# Patient Record
Sex: Male | Born: 1975
Health system: Southern US, Community
[De-identification: ages and names within clinical notes are randomized; demographics above are authoritative.]

## PROBLEM LIST (undated history)

## (undated) DIAGNOSIS — I480 Paroxysmal atrial fibrillation: Secondary | ICD-10-CM

## (undated) DIAGNOSIS — I4891 Unspecified atrial fibrillation: Secondary | ICD-10-CM

## (undated) DIAGNOSIS — J45909 Unspecified asthma, uncomplicated: Secondary | ICD-10-CM

## (undated) DIAGNOSIS — I10 Essential (primary) hypertension: Secondary | ICD-10-CM

## (undated) DIAGNOSIS — I499 Cardiac arrhythmia, unspecified: Secondary | ICD-10-CM

## (undated) DIAGNOSIS — I441 Atrioventricular block, second degree: Secondary | ICD-10-CM

## (undated) DIAGNOSIS — R7303 Prediabetes: Secondary | ICD-10-CM

## (undated) DIAGNOSIS — K219 Gastro-esophageal reflux disease without esophagitis: Secondary | ICD-10-CM

## (undated) HISTORY — PX: BRAIN SURGERY: SHX531

---

## 2012-06-17 ENCOUNTER — Emergency Department: Payer: Self-pay | Admitting: Emergency Medicine

## 2012-08-04 ENCOUNTER — Emergency Department: Payer: Self-pay | Admitting: Internal Medicine

## 2012-08-04 LAB — BASIC METABOLIC PANEL
Anion Gap: 5 — ABNORMAL LOW (ref 7–16)
BUN: 9 mg/dL (ref 7–18)
Creatinine: 1.04 mg/dL (ref 0.60–1.30)
EGFR (African American): >60
Glucose: 109 mg/dL — ABNORMAL HIGH (ref 65–99)
Osmolality: 277 (ref 275–301)
Potassium: 3.8 mmol/L (ref 3.5–5.1)

## 2012-08-04 LAB — MAGNESIUM: Magnesium: 1.8 mg/dL

## 2012-10-17 ENCOUNTER — Emergency Department: Payer: Self-pay | Admitting: Emergency Medicine

## 2013-05-10 ENCOUNTER — Emergency Department: Payer: Self-pay | Admitting: Internal Medicine

## 2013-07-22 ENCOUNTER — Emergency Department: Payer: Self-pay | Admitting: Emergency Medicine

## 2013-10-01 ENCOUNTER — Emergency Department: Payer: Self-pay

## 2013-11-30 ENCOUNTER — Emergency Department: Payer: Self-pay | Admitting: Emergency Medicine

## 2013-12-01 ENCOUNTER — Emergency Department: Payer: Self-pay | Admitting: Emergency Medicine

## 2014-01-12 ENCOUNTER — Emergency Department: Payer: Self-pay | Admitting: Emergency Medicine

## 2014-02-18 ENCOUNTER — Emergency Department: Payer: Self-pay | Admitting: Emergency Medicine

## 2014-03-06 ENCOUNTER — Emergency Department: Payer: Self-pay | Admitting: Emergency Medicine

## 2014-04-02 ENCOUNTER — Emergency Department: Payer: Self-pay | Admitting: Student

## 2014-04-10 ENCOUNTER — Emergency Department: Payer: Self-pay | Admitting: Emergency Medicine

## 2014-05-18 ENCOUNTER — Emergency Department: Payer: Self-pay | Admitting: Emergency Medicine

## 2014-07-14 ENCOUNTER — Emergency Department: Payer: Self-pay | Admitting: Student

## 2014-09-23 ENCOUNTER — Emergency Department: Payer: Self-pay | Admitting: Emergency Medicine

## 2014-09-29 ENCOUNTER — Emergency Department: Payer: Self-pay | Admitting: Emergency Medicine

## 2015-09-11 DIAGNOSIS — J45909 Unspecified asthma, uncomplicated: Secondary | ICD-10-CM | POA: Diagnosis not present

## 2015-09-11 DIAGNOSIS — J4530 Mild persistent asthma, uncomplicated: Secondary | ICD-10-CM | POA: Diagnosis not present

## 2015-09-11 DIAGNOSIS — K219 Gastro-esophageal reflux disease without esophagitis: Secondary | ICD-10-CM | POA: Diagnosis not present

## 2015-09-11 DIAGNOSIS — Z23 Encounter for immunization: Secondary | ICD-10-CM | POA: Diagnosis not present

## 2015-09-19 DIAGNOSIS — J4530 Mild persistent asthma, uncomplicated: Secondary | ICD-10-CM | POA: Diagnosis not present

## 2015-10-16 DIAGNOSIS — R0602 Shortness of breath: Secondary | ICD-10-CM | POA: Diagnosis not present

## 2016-01-01 ENCOUNTER — Emergency Department
Admission: EM | Admit: 2016-01-01 | Discharge: 2016-01-01 | Disposition: A | Discharge status: Home / Self Care | Payer: Self-pay | Attending: Emergency Medicine | Admitting: Emergency Medicine

## 2016-01-01 DIAGNOSIS — F17200 Nicotine dependence, unspecified, uncomplicated: Secondary | ICD-10-CM | POA: Insufficient documentation

## 2016-01-01 DIAGNOSIS — W450XXA Nail entering through skin, initial encounter: Secondary | ICD-10-CM | POA: Insufficient documentation

## 2016-01-01 DIAGNOSIS — Y939 Activity, unspecified: Secondary | ICD-10-CM | POA: Insufficient documentation

## 2016-01-01 DIAGNOSIS — S91331A Puncture wound without foreign body, right foot, initial encounter: Secondary | ICD-10-CM | POA: Insufficient documentation

## 2016-01-01 DIAGNOSIS — Y999 Unspecified external cause status: Secondary | ICD-10-CM | POA: Insufficient documentation

## 2016-01-01 DIAGNOSIS — Y929 Unspecified place or not applicable: Secondary | ICD-10-CM | POA: Insufficient documentation

## 2016-01-01 DIAGNOSIS — Z5321 Procedure and treatment not carried out due to patient leaving prior to being seen by health care provider: Secondary | ICD-10-CM | POA: Insufficient documentation

## 2016-01-01 NOTE — ED Notes (Addendum)
Pt in with co scrapping the bottom of his right foot along a nail, 4 abrasions noted to area.

## 2016-01-01 NOTE — ED Notes (Signed)
Patient who is an employee here states that he can't wait any longer as he has to be at work at [employer] and will seek a tetanus shot at employee health.

## 2016-01-02 ENCOUNTER — Encounter: Payer: Self-pay | Admitting: Physician Assistant

## 2016-01-02 ENCOUNTER — Ambulatory Visit: Payer: Self-pay | Admitting: Physician Assistant

## 2016-01-02 VITALS — BP 150/80 | HR 80 | Temp 98.6°F

## 2016-01-02 DIAGNOSIS — S90811A Abrasion, right foot, initial encounter: Secondary | ICD-10-CM

## 2016-01-02 NOTE — Progress Notes (Signed)
S: scraped foot on a nail, area was bleeding a lot, went to ER but left before being seen, tdap is utd, less than 5 years per patient, no fever/chills  O: vitals wnl, nad, skin with several abrasion on plantar surface of r foot, no redness, pus, or drainage noted, some dried blood on the other toes, n/v intact  A: abrasion to foot  P: reassurance, applied 2 bandaids, use soap and water to clean area, if redness/pus/swelling return for eval

## 2016-07-30 DIAGNOSIS — J45998 Other asthma: Secondary | ICD-10-CM | POA: Diagnosis not present

## 2016-07-30 DIAGNOSIS — K219 Gastro-esophageal reflux disease without esophagitis: Secondary | ICD-10-CM | POA: Diagnosis not present

## 2016-07-30 DIAGNOSIS — R739 Hyperglycemia, unspecified: Secondary | ICD-10-CM | POA: Diagnosis not present

## 2016-09-04 ENCOUNTER — Encounter: Payer: Self-pay | Admitting: Emergency Medicine

## 2016-09-04 ENCOUNTER — Emergency Department
Admission: EM | Admit: 2016-09-04 | Discharge: 2016-09-04 | Disposition: A | Discharge status: Home / Self Care | Payer: 59 | Attending: Emergency Medicine | Admitting: Emergency Medicine

## 2016-09-04 DIAGNOSIS — J4520 Mild intermittent asthma, uncomplicated: Secondary | ICD-10-CM | POA: Diagnosis not present

## 2016-09-04 DIAGNOSIS — Z87891 Personal history of nicotine dependence: Secondary | ICD-10-CM | POA: Diagnosis not present

## 2016-09-04 DIAGNOSIS — R112 Nausea with vomiting, unspecified: Secondary | ICD-10-CM | POA: Insufficient documentation

## 2016-09-04 DIAGNOSIS — J45909 Unspecified asthma, uncomplicated: Secondary | ICD-10-CM | POA: Diagnosis not present

## 2016-09-04 LAB — COMPREHENSIVE METABOLIC PANEL
ALBUMIN: 3.9 g/dL (ref 3.5–5.0)
ALK PHOS: 58 U/L (ref 38–126)
ALT: 32 U/L (ref 17–63)
AST: 33 U/L (ref 15–41)
Anion gap: 5 (ref 5–15)
BILIRUBIN TOTAL: 0.8 mg/dL (ref 0.3–1.2)
BUN: 15 mg/dL (ref 6–20)
CALCIUM: 9 mg/dL (ref 8.9–10.3)
CO2: 27 mmol/L (ref 22–32)
Chloride: 106 mmol/L (ref 101–111)
Creatinine, Ser: 1.06 mg/dL (ref 0.61–1.24)
GFR calc Af Amer: >60 mL/min (ref >60)
GFR calc non Af Amer: >60 mL/min (ref >60)
GLUCOSE: 128 mg/dL — AB (ref 65–99)
Potassium: 4.7 mmol/L (ref 3.5–5.1)
Sodium: 138 mmol/L (ref 135–145)
TOTAL PROTEIN: 7.4 g/dL (ref 6.5–8.1)

## 2016-09-04 LAB — CBC
HEMATOCRIT: 37.8 % — AB (ref 40.0–52.0)
Hemoglobin: 12.5 g/dL — ABNORMAL LOW (ref 13.0–18.0)
MCH: 24 pg — ABNORMAL LOW (ref 26.0–34.0)
MCHC: 33.1 g/dL (ref 32.0–36.0)
MCV: 72.4 fL — ABNORMAL LOW (ref 80.0–100.0)
Platelets: 294 10*3/uL (ref 150–440)
RBC: 5.23 MIL/uL (ref 4.40–5.90)
RDW: 17.6 % — AB (ref 11.5–14.5)
WBC: 8.5 10*3/uL (ref 3.8–10.6)

## 2016-09-04 LAB — LIPASE, BLOOD: Lipase: 18 U/L (ref 11–51)

## 2016-09-04 MED ORDER — ONDANSETRON 4 MG PO TBDP
4.0000 mg | ORAL_TABLET | Freq: Once | ORAL | Status: AC
  Administered 2016-09-04: 4 mg via ORAL
  Filled 2016-09-04: qty 1

## 2016-09-04 MED ORDER — ALBUTEROL SULFATE (2.5 MG/3ML) 0.083% IN NEBU
2.5000 mg | INHALATION_SOLUTION | RESPIRATORY_TRACT | Status: AC
  Administered 2016-09-04: 2.5 mg via RESPIRATORY_TRACT
  Filled 2016-09-04: qty 3

## 2016-09-04 MED ORDER — ALBUTEROL SULFATE HFA 108 (90 BASE) MCG/ACT IN AERS
1.0000 | INHALATION_SPRAY | Freq: Once | RESPIRATORY_TRACT | Status: AC
  Administered 2016-09-04: 2 via RESPIRATORY_TRACT
  Filled 2016-09-04: qty 6.7

## 2016-09-04 MED ORDER — ONDANSETRON 4 MG PO TBDP: 4.0000 mg | ORAL_TABLET | Freq: Four times a day (QID) | ORAL | 0 refills | Status: DC | PRN

## 2016-09-04 NOTE — ED Triage Notes (Signed)
Patient ambulatory to triage with steady gait, without difficulty or distress noted; pt reports N/V since last night; denies pain, denies any accomp symptoms

## 2016-09-04 NOTE — Discharge Instructions (Signed)

## 2016-09-04 NOTE — ED Provider Notes (Signed)
[facility] Emergency Department Provider Note   ____________________________________________   None    (approximate)  I have reviewed the triage vital signs and the nursing notes.   HISTORY  Chief Complaint Emesis    HPI Spencer Tanner is a 41 y.o. male for evaluation of vomiting.  This morning the patient started feeling an upset stomach, vomited 3-4 times. No black or bloody emesis. Reports his stomach felt off after drinking a "permanent coffee". He also had pasta last night. He has not had any pain. No fevers no chills. No chest pain or trouble breathing. He does report that he wheezes slightly, and this is not unusual in that he normally uses his inhaler a couple of times a day for the last several years.  No chest pain or shortness of breath. No fevers. No pain in the lower abdomen. Denies any pain or discomfort right now. Nausea and symptoms have all gone away now. No further vomiting since arriving to the emergency room.   History reviewed. No pertinent past medical history.  There are no active problems to display for this patient.   History reviewed. No pertinent surgical history.  Prior to Admission medications   Medication Sig Start Date End Date Taking? Authorizing Provider  albuterol (PROVENTIL HFA;VENTOLIN HFA) 108 (90 Base) MCG/ACT inhaler Inhale 2 puffs into the lungs every 6 (six) hours as needed. 07/30/16  Yes Historical Provider, MD  SYMBICORT 160-4.5 MCG/ACT inhaler Inhale 2 puffs into the lungs 2 (two) times daily. 08/21/16  Yes Historical Provider, MD  ondansetron (ZOFRAN ODT) 4 MG disintegrating tablet Take 1 tablet (4 mg total) by mouth every 6 (six) hours as needed for nausea or vomiting. 09/04/16   Sharyn Creamer, MD    Allergies Patient has no known allergies.  No family history on file.  Social History Social History  Substance Use Topics  . Smoking status: Former Games developer  . Smokeless tobacco: Never Used  .  Alcohol use No    Review of Systems Constitutional: No fever/chills Eyes: No visual changes. ENT: No sore throat. Cardiovascular: Denies chest pain. Respiratory: Denies shortness of breath. Gastrointestinal: No abdominal pain.    No diarrhea.  No constipation. Denies any previous abdominal problems or surgeries. Genitourinary: Negative for dysuria. Musculoskeletal: Negative for back pain. Skin: Negative for rash. Neurological: Negative for headaches, focal weakness or numbness.  10-point ROS otherwise negative.  ____________________________________________   PHYSICAL EXAM:  VITAL SIGNS: ED Triage Vitals  Enc Vitals Group     BP 09/04/16 0644 (!) 145/74     Pulse Rate 09/04/16 0644 87     Resp 09/04/16 0644 18     Temp 09/04/16 0644 98.1 F (36.7 C)     Temp Source 09/04/16 0644 Oral     SpO2 09/04/16 0644 100 %     Weight 09/04/16 0642 200 lb (90.7 kg)     Height 09/04/16 0642 6\' 1"  (1.854 m)     Head Circumference --      Peak Flow --      Pain Score --      Pain Loc --      Pain Edu? --      Excl. in GC? --     Constitutional: Alert and oriented. Well appearing and in no acute distress.Patient is very pleasant. Eyes: Conjunctivae are normal. PERRL. EOMI. Head: Atraumatic. Nose: No congestion/rhinnorhea. Mouth/Throat: Mucous membranes are moist.  Oropharynx non-erythematous. Neck: No stridor.   Cardiovascular: Normal rate, regular rhythm. Grossly  normal heart sounds.  Good peripheral circulation. Respiratory: Normal respiratory effort.  No retractions. Lungs CTAB except for some mild end expiratory wheezing bilaterally. Speech in full and clear sentences.. Gastrointestinal: Soft and nontender. No distention. No pain at McBurney's point. Negative Murphy. Detailed abdominal exam is normal. No groin pain or swelling. Musculoskeletal: No lower extremity tenderness nor edema.  No joint effusions. Neurologic:  Normal speech and language. No gross focal neurologic  deficits are appreciated. Skin:  Skin is warm, dry and intact. No rash noted. Psychiatric: Mood and affect are normal. Speech and behavior are normal.  ____________________________________________   LABS (all labs ordered are listed, but only abnormal results are displayed)  Labs Reviewed  COMPREHENSIVE METABOLIC PANEL - Abnormal; Notable for the following:       Result Value   Glucose, Bld 128 (*)    All other components within normal limits  CBC - Abnormal; Notable for the following:    Hemoglobin 12.5 (*)    HCT 37.8 (*)    MCV 72.4 (*)    MCH 24.0 (*)    RDW 17.6 (*)    All other components within normal limits  LIPASE, BLOOD  URINALYSIS, COMPLETE (UACMP) WITH MICROSCOPIC   ____________________________________________  EKG   ____________________________________________  RADIOLOGY  I discussed with the patient the risks and benefits of abdominal CT scan. The present time there is no clear indication that the patient requires CT, the patient does have an abdominal complaint but exam does not suggest acute surgical abdomen and my suspicion for intra-abdominal infection including appendicitis, cholecystitis, aaa, dissection, ischemia, perforation, pancreatitis, diverticulitis or other acute major intra-abdominal process is quite low. After discussing the risks and benefits including benefits of additional evaluation for diagnoses, ruling out infection/perforation/aaa/etc, but also discussing the risks including low, "well less than 1%," but not 0 risk of inducing cancers due to radiation and potential risks of contrast the patient indicated via our shared medical decision-making that she would not do a CAT scan. Rather if the patient does have worsening symptoms, develops a high fever, develops pain or persistent discomfort in the right upper quadrant or right lower quadrant, or other new concerns arise they will come back to emergency room right away. As the patient's clinician I  think this is a very reasonable decision having discussed general risks and benefits of CT, and my clinical suspicion that CT would be of benefit at this time is very low.  ____________________________________________   PROCEDURES  Procedure(s) performed: None  Procedures  Critical Care performed: No  ____________________________________________   INITIAL IMPRESSION / ASSESSMENT AND PLAN / ED COURSE  Pertinent labs & imaging results that were available during my care of the patient were reviewed by me and considered in my medical decision making (see chart for details).  Differential diagnosis includes but is not limited to, abdominal perforation, aortic dissection, cholecystitis, appendicitis, diverticulitis, colitis, esophagitis/gastritis, kidney stone, pyelonephritis, urinary tract infection, aortic aneurysm. All are considered in decision and treatment plan. Based upon the patient's presentation and risk factors, and the fact that his abdominal exam now is normal with no ongoing symptomatology, I feel the patient is not in need of acute imaging, and more than likely has an acute self-limited type condition such as gastritis, food poisoning, etc.  Patient has also slight end expiratory wheezing on exam, reports that he uses inhaler a couple of times every day and that he feels at present this would be something he would do for his current wheezing. He  denies feeling short of breath and does not demonstrate any signs of increased work of breathing.  I discussed careful abdominal pain return precautions with the patient, he is in agreement provide him a prescription for Zofran should his nausea or vomiting recur. He will come back to the emergency room right away if he has any fever, recurrent pain, vomiting, abdominal pain, or other new concerns arise.  ----------------------------------------- 8:25 AM on 09/04/2016 -----------------------------------------  Patient taking by mouth  well. Reports no pain nausea or vomiting. Also reports he feels that he is breathing normally, lungs are clear speech in full sentences.  Return precautions and treatment recommendations and follow-up discussed with the patient who is agreeable with the plan.      ____________________________________________   FINAL CLINICAL IMPRESSION(S) / ED DIAGNOSES  Final diagnoses:  Non-intractable vomiting with nausea, unspecified vomiting type  Stable asthma, mild intermittent      NEW MEDICATIONS STARTED DURING THIS VISIT:  New Prescriptions   ONDANSETRON (ZOFRAN ODT) 4 MG DISINTEGRATING TABLET    Take 1 tablet (4 mg total) by mouth every 6 (six) hours as needed for nausea or vomiting.     Note:  This document was prepared using Dragon voice recognition software and may include unintentional dictation errors.     Sharyn CreamerMark Dray Dente, MD 09/04/16 [phone]

## 2016-09-04 NOTE — ED Notes (Signed)
Pt discharged home after verbalizing understanding of discharge instructions; nad noted. 

## 2016-11-07 ENCOUNTER — Encounter: Payer: Self-pay | Admitting: Emergency Medicine

## 2016-11-07 ENCOUNTER — Emergency Department: Payer: 59

## 2016-11-07 ENCOUNTER — Emergency Department
Admission: EM | Admit: 2016-11-07 | Discharge: 2016-11-07 | Disposition: A | Discharge status: Home / Self Care | Payer: 59 | Attending: Emergency Medicine | Admitting: Emergency Medicine

## 2016-11-07 DIAGNOSIS — J101 Influenza due to other identified influenza virus with other respiratory manifestations: Secondary | ICD-10-CM | POA: Diagnosis not present

## 2016-11-07 DIAGNOSIS — Z87891 Personal history of nicotine dependence: Secondary | ICD-10-CM | POA: Insufficient documentation

## 2016-11-07 DIAGNOSIS — Z79899 Other long term (current) drug therapy: Secondary | ICD-10-CM | POA: Diagnosis not present

## 2016-11-07 DIAGNOSIS — J09X2 Influenza due to identified novel influenza A virus with other respiratory manifestations: Secondary | ICD-10-CM | POA: Diagnosis not present

## 2016-11-07 DIAGNOSIS — J45909 Unspecified asthma, uncomplicated: Secondary | ICD-10-CM | POA: Diagnosis not present

## 2016-11-07 DIAGNOSIS — R05 Cough: Secondary | ICD-10-CM | POA: Diagnosis not present

## 2016-11-07 HISTORY — DX: Unspecified asthma, uncomplicated: J45.909

## 2016-11-07 LAB — CBC
HCT: 36.6 % — ABNORMAL LOW (ref 40.0–52.0)
Hemoglobin: 12.2 g/dL — ABNORMAL LOW (ref 13.0–18.0)
MCH: 24.1 pg — ABNORMAL LOW (ref 26.0–34.0)
MCHC: 33.2 g/dL (ref 32.0–36.0)
MCV: 72.8 fL — ABNORMAL LOW (ref 80.0–100.0)
PLATELETS: 287 10*3/uL (ref 150–440)
RBC: 5.04 MIL/uL (ref 4.40–5.90)
RDW: 18.4 % — AB (ref 11.5–14.5)
WBC: 7.3 10*3/uL (ref 3.8–10.6)

## 2016-11-07 LAB — URINALYSIS, COMPLETE (UACMP) WITH MICROSCOPIC
Bacteria, UA: NONE SEEN
Bilirubin Urine: NEGATIVE
GLUCOSE, UA: NEGATIVE mg/dL
Hgb urine dipstick: NEGATIVE
Ketones, ur: 5 mg/dL — AB
Leukocytes, UA: NEGATIVE
NITRITE: NEGATIVE
Protein, ur: NEGATIVE mg/dL
RBC / HPF: NONE SEEN RBC/hpf (ref 0–5)
SPECIFIC GRAVITY, URINE: 1.006 (ref 1.005–1.030)
Squamous Epithelial / LPF: NONE SEEN
pH: 6 (ref 5.0–8.0)

## 2016-11-07 LAB — COMPREHENSIVE METABOLIC PANEL
ALK PHOS: 47 U/L (ref 38–126)
ALT: 35 U/L (ref 17–63)
ANION GAP: 5 (ref 5–15)
AST: 80 U/L — ABNORMAL HIGH (ref 15–41)
Albumin: 3.8 g/dL (ref 3.5–5.0)
BILIRUBIN TOTAL: 0.4 mg/dL (ref 0.3–1.2)
BUN: 8 mg/dL (ref 6–20)
CALCIUM: 8.6 mg/dL — AB (ref 8.9–10.3)
CO2: 26 mmol/L (ref 22–32)
Chloride: 104 mmol/L (ref 101–111)
Creatinine, Ser: 1.21 mg/dL (ref 0.61–1.24)
GFR calc non Af Amer: >60 mL/min (ref >60)
GLUCOSE: 110 mg/dL — AB (ref 65–99)
POTASSIUM: 3.9 mmol/L (ref 3.5–5.1)
Sodium: 135 mmol/L (ref 135–145)
TOTAL PROTEIN: 6.7 g/dL (ref 6.5–8.1)

## 2016-11-07 LAB — LIPASE, BLOOD: Lipase: 17 U/L (ref 11–51)

## 2016-11-07 LAB — INFLUENZA PANEL BY PCR (TYPE A & B)
INFLAPCR: POSITIVE — AB
Influenza B By PCR: NEGATIVE

## 2016-11-07 MED ORDER — BENZONATATE 100 MG PO CAPS: 100.0000 mg | ORAL_CAPSULE | Freq: Three times a day (TID) | ORAL | 0 refills | Status: DC | PRN

## 2016-11-07 MED ORDER — OSELTAMIVIR PHOSPHATE 75 MG PO CAPS: 75.0000 mg | ORAL_CAPSULE | Freq: Two times a day (BID) | ORAL | 0 refills | Status: AC

## 2016-11-07 MED ORDER — ACETAMINOPHEN 500 MG PO TABS
1000.0000 mg | ORAL_TABLET | Freq: Once | ORAL | Status: AC
  Administered 2016-11-07: 1000 mg via ORAL
  Filled 2016-11-07: qty 2

## 2016-11-07 MED ORDER — SODIUM CHLORIDE 0.9 % IV BOLUS (SEPSIS)
1000.0000 mL | Freq: Once | INTRAVENOUS | Status: AC
  Administered 2016-11-07: 1000 mL via INTRAVENOUS

## 2016-11-07 NOTE — Discharge Instructions (Addendum)
Drink plenty of fluids to keep yourself hydrated. Follow up with your primary care doctor within the next 2-3 days. Return to the emergency room if you have new or worsening abdominal pain, fever, chest pain, or any new symptoms that are concerning to you.

## 2016-11-07 NOTE — ED Provider Notes (Signed)
[facility] Emergency Department Provider Note  ____________________________________________  Time seen: Approximately 9:55 AM  I have reviewed the triage vital signs and the nursing notes.   HISTORY  Chief Complaint Abdominal Pain   HPI Spencer Tanner is a 41 y.o. male a history of asthma who presents for evaluation of abdominal pain. Patient works here in [employer]. He reports that he has had a dry cough since yesterday. He was coughing when the muscles of his abdomen cramped and he came to the ED for evaluation. He denies pain at this time. He reports that when he coughs he feels cramping in his abdominal muscles located diffusely and nonradiating. As soon as he stops coughing the abdominal pain goes away. He denies fever or chills, shortness of breath or wheezing. He does have a history of asthma. He endorses one episode of diarrhea today, non bloody with no melena. He also denies nausea, vomiting, constipation, dysuria, hematuria. Patient is back to baseline.He reports that he has not eaten anything today.  Past Medical History:  Diagnosis Date  . Asthma     There are no active problems to display for this patient.   History reviewed. No pertinent surgical history.  Prior to Admission medications   Medication Sig Start Date End Date Taking? Authorizing Provider  albuterol (PROVENTIL HFA;VENTOLIN HFA) 108 (90 Base) MCG/ACT inhaler Inhale 2 puffs into the lungs every 6 (six) hours as needed. 07/30/16   Historical Provider, MD  benzonatate (TESSALON) 100 MG capsule Take 1 capsule (100 mg total) by mouth 3 (three) times daily as needed for cough. 11/07/16   Nita Sicklearolina Lannie Yusuf, MD  ondansetron (ZOFRAN ODT) 4 MG disintegrating tablet Take 1 tablet (4 mg total) by mouth every 6 (six) hours as needed for nausea or vomiting. 09/04/16   Sharyn CreamerMark Quale, MD  oseltamivir (TAMIFLU) 75 MG capsule Take 1 capsule (75 mg total) by mouth 2 (two) times daily.  11/07/16 11/12/16  Nita Sicklearolina Buck Mcaffee, MD  SYMBICORT 160-4.5 MCG/ACT inhaler Inhale 2 puffs into the lungs 2 (two) times daily. 08/21/16   Historical Provider, MD    Allergies Patient has no known allergies.  No family history on file.  Social History Social History  Substance Use Topics  . Smoking status: Former Games developermoker  . Smokeless tobacco: Never Used  . Alcohol use No    Review of Systems  Constitutional: Negative for fever. Eyes: Negative for visual changes. ENT: Negative for sore throat. Neck: No neck pain  Cardiovascular: Negative for chest pain. Respiratory: Negative for shortness of breath. + cough Gastrointestinal: + abdominal muscle cramp and diarrhea. NO abdominal pain, vomiting Genitourinary: Negative for dysuria. Musculoskeletal: Negative for back pain. Skin: Negative for rash. Neurological: Negative for headaches, weakness or numbness. Psych: No SI or HI  ____________________________________________   PHYSICAL EXAM:  VITAL SIGNS: ED Triage Vitals  Enc Vitals Group     BP 11/07/16 0921 132/78     Pulse Rate 11/07/16 0921 (!) 107     Resp 11/07/16 0921 16     Temp 11/07/16 0921 98.2 F (36.8 C)     Temp Source 11/07/16 0921 Oral     SpO2 11/07/16 0921 100 %     Weight 11/07/16 0919 212 lb (96.2 kg)     Height --      Head Circumference --      Peak Flow --      Pain Score 11/07/16 0919 10     Pain Loc --  Pain Edu? --      Excl. in GC? --     Constitutional: Alert and oriented. Well appearing and in no apparent distress. HEENT:      Head: Normocephalic and atraumatic.         Eyes: Conjunctivae are normal. Sclera is non-icteric. EOMI. PERRL      Mouth/Throat: Mucous membranes are moist.       Neck: Supple with no signs of meningismus. Cardiovascular: Tachycardic with regular rhythm. No murmurs, gallops, or rubs. 2+ symmetrical distal pulses are present in all extremities. No JVD. Respiratory: Normal respiratory effort. Lungs are clear to  auscultation bilaterally. No wheezes, crackles, or rhonchi.  Gastrointestinal: Soft, non tender, and non distended with positive bowel sounds. No rebound or guarding. Genitourinary: No CVA tenderness. Bilateral testicles are descended with no tenderness to palpation, bilateral positive cremasteric reflexes are present, no swelling or erythema of the scrotum. No evidence of inguinal hernia. Musculoskeletal: Nontender with normal range of motion in all extremities. No edema, cyanosis, or erythema of extremities. Neurologic: Normal speech and language. Face is symmetric. Moving all extremities. No gross focal neurologic deficits are appreciated. Skin: Skin is warm, dry and intact. No rash noted. Psychiatric: Mood and affect are normal. Speech and behavior are normal.  ____________________________________________   LABS (all labs ordered are listed, but only abnormal results are displayed)  Labs Reviewed  COMPREHENSIVE METABOLIC PANEL - Abnormal; Notable for the following:       Result Value   Glucose, Bld 110 (*)    Calcium 8.6 (*)    AST 80 (*)    All other components within normal limits  CBC - Abnormal; Notable for the following:    Hemoglobin 12.2 (*)    HCT 36.6 (*)    MCV 72.8 (*)    MCH 24.1 (*)    RDW 18.4 (*)    All other components within normal limits  URINALYSIS, COMPLETE (UACMP) WITH MICROSCOPIC - Abnormal; Notable for the following:    Color, Urine STRAW (*)    APPearance CLEAR (*)    Ketones, ur 5 (*)    All other components within normal limits  INFLUENZA PANEL BY PCR (TYPE A & B) - Abnormal; Notable for the following:    Influenza A By PCR POSITIVE (*)    All other components within normal limits  LIPASE, BLOOD   ____________________________________________  EKG  none ____________________________________________  RADIOLOGY  none  ____________________________________________   PROCEDURES  Procedure(s) performed: None Procedures Critical Care  performed:  None ____________________________________________   INITIAL IMPRESSION / ASSESSMENT AND PLAN / ED COURSE  41 y.o. male a history of asthma who presents for evaluation of abdominal muscle cramp in the setting of cough and diarrhea possibly from viral process. Patient is extremely well-appearing, in no distress, vital signs show mild tachycardia with heart rate of 107 otherwise within normal limits, his abdomen is soft with no tenderness throughout, lungs are clear to auscultation with no wheezing or crackles and good air movement, GU exam with no acute findings. Patient never had abdominal pain just cramping of his muscles with coughing. His blood work shows normal CBC, CMP with a creatinine of 1.21 in the setting of 12 hours of fasting and mild tachycardia therefore we'll give IV fluids. Lipase is negative. Chest x-ray with no infiltrate.  Clinical Course as of Nov 07 1537  Fri Nov 07, 2016  1156 Workup with no acute findings. Patient is tolerating by mouth. No longer having abdominal pain. Vital  signs show now low grade fever of 100.65F. Will give tylenol. UA pending. CXR with no PNA. Will send Influenza. Will give 2nd bolus.  [CV]  1510 Influenza A positive. Patient is tolerating by mouth and remains extremely well appearing. Will be discharged home at this time.  [CV]    Clinical Course User Index [CV] Nita Sickle, MD    Pertinent labs & imaging results that were available during my care of the patient were reviewed by me and considered in my medical decision making (see chart for details).    ____________________________________________   FINAL CLINICAL IMPRESSION(S) / ED DIAGNOSES  Final diagnoses:  Influenza A      NEW MEDICATIONS STARTED DURING THIS VISIT:  New Prescriptions   BENZONATATE (TESSALON) 100 MG CAPSULE    Take 1 capsule (100 mg total) by mouth 3 (three) times daily as needed for cough.   OSELTAMIVIR (TAMIFLU) 75 MG CAPSULE    Take 1 capsule (75  mg total) by mouth 2 (two) times daily.     Note:  This document was prepared using Dragon voice recognition software and may include unintentional dictation errors.    Nita Sickle, MD 11/07/16 1539

## 2016-11-07 NOTE — ED Triage Notes (Signed)
Pt states that he was at work today, pt started coughing and then started having pain in his lower abdomen. Pt rates pain 10/10 in triage. States that he did have diarrhea earlier today but denies N/V.   Per Will with Employee health- workers comp but does not need drug screen

## 2016-11-10 ENCOUNTER — Encounter: Payer: Self-pay | Admitting: Physician Assistant

## 2016-11-10 ENCOUNTER — Ambulatory Visit: Payer: Self-pay | Admitting: Physician Assistant

## 2016-11-10 VITALS — BP 120/80 | HR 84 | Temp 98.9°F

## 2016-11-10 DIAGNOSIS — J209 Acute bronchitis, unspecified: Secondary | ICD-10-CM

## 2016-11-10 MED ORDER — AZITHROMYCIN 250 MG PO TABS: ORAL_TABLET | ORAL | 0 refills | Status: DC

## 2016-11-10 MED ORDER — DEXAMETHASONE SODIUM PHOSPHATE 10 MG/ML IJ SOLN
10.0000 mg | Freq: Once | INTRAMUSCULAR | Status: AC
  Administered 2016-11-10: 10 mg via INTRAMUSCULAR

## 2016-11-10 MED ORDER — METHYLPREDNISOLONE 4 MG PO TBPK: ORAL_TABLET | ORAL | 0 refills | Status: DC

## 2016-11-10 MED ORDER — IPRATROPIUM-ALBUTEROL 0.5-2.5 (3) MG/3ML IN SOLN
3.0000 mL | Freq: Once | RESPIRATORY_TRACT | Status: AC
  Administered 2016-11-10: 3 mL via RESPIRATORY_TRACT

## 2016-11-10 NOTE — Progress Notes (Signed)
S: states he was seen in the ER for abdominal pain due to cough, states his flu test was +A; had a cxr which was normal, still coughing, doesn't think the inhalers are opening him up, mucus is still clear, no fever/chills today, no cp/. No v/d  O: vitals wnl, nad, lungs with diffuse wheezing in all lungs fields, cv rrr, svn duoneb given, increased air movement,   A: acute bronchitis secondary to influnenza A  P: decadron 10 mg Im, medrol dose pack, zpack

## 2016-11-11 ENCOUNTER — Ambulatory Visit: Payer: Self-pay | Admitting: Physician Assistant

## 2016-11-11 ENCOUNTER — Encounter: Payer: Self-pay | Admitting: Physician Assistant

## 2016-11-11 VITALS — BP 130/90 | HR 93 | Temp 98.5°F

## 2016-11-11 DIAGNOSIS — J209 Acute bronchitis, unspecified: Secondary | ICD-10-CM

## 2016-11-11 NOTE — Progress Notes (Signed)
S: here for recheck, states he is feeling much better since the steroid injection and rx's; no fever/chills, no cp/sob  O: vitals wnl nad, lungs c t a, cv rrr  A: resolving bronchitis secondary to influenza   P:  Return to work on [employer] 11/13/16 unless pt begins to run fever again.  If becomes febrile should return to clinic for reeval

## 2017-01-22 DIAGNOSIS — J4540 Moderate persistent asthma, uncomplicated: Secondary | ICD-10-CM | POA: Diagnosis not present

## 2017-01-22 DIAGNOSIS — E538 Deficiency of other specified B group vitamins: Secondary | ICD-10-CM | POA: Diagnosis not present

## 2017-01-22 DIAGNOSIS — D649 Anemia, unspecified: Secondary | ICD-10-CM | POA: Diagnosis not present

## 2017-01-22 DIAGNOSIS — R739 Hyperglycemia, unspecified: Secondary | ICD-10-CM | POA: Diagnosis not present

## 2017-01-22 DIAGNOSIS — K219 Gastro-esophageal reflux disease without esophagitis: Secondary | ICD-10-CM | POA: Diagnosis not present

## 2017-02-28 ENCOUNTER — Emergency Department
Admission: EM | Admit: 2017-02-28 | Discharge: 2017-02-28 | Disposition: A | Discharge status: Home / Self Care | Payer: 59 | Attending: Emergency Medicine | Admitting: Emergency Medicine

## 2017-02-28 ENCOUNTER — Encounter: Payer: Self-pay | Admitting: Emergency Medicine

## 2017-02-28 ENCOUNTER — Emergency Department: Payer: 59

## 2017-02-28 DIAGNOSIS — Y92481 Parking lot as the place of occurrence of the external cause: Secondary | ICD-10-CM | POA: Insufficient documentation

## 2017-02-28 DIAGNOSIS — Y998 Other external cause status: Secondary | ICD-10-CM | POA: Insufficient documentation

## 2017-02-28 DIAGNOSIS — J45909 Unspecified asthma, uncomplicated: Secondary | ICD-10-CM | POA: Insufficient documentation

## 2017-02-28 DIAGNOSIS — F17200 Nicotine dependence, unspecified, uncomplicated: Secondary | ICD-10-CM | POA: Diagnosis not present

## 2017-02-28 DIAGNOSIS — Y9389 Activity, other specified: Secondary | ICD-10-CM | POA: Diagnosis not present

## 2017-02-28 DIAGNOSIS — M545 Low back pain, unspecified: Secondary | ICD-10-CM

## 2017-02-28 MED ORDER — LIDOCAINE 5 % EX PTCH
1.0000 | MEDICATED_PATCH | CUTANEOUS | Status: DC
  Administered 2017-02-28: 1 via TRANSDERMAL
  Filled 2017-02-28: qty 1

## 2017-02-28 MED ORDER — IBUPROFEN 600 MG PO TABS: 600.0000 mg | ORAL_TABLET | Freq: Four times a day (QID) | ORAL | 0 refills | Status: DC | PRN

## 2017-02-28 MED ORDER — KETOROLAC TROMETHAMINE 60 MG/2ML IM SOLN
30.0000 mg | Freq: Once | INTRAMUSCULAR | Status: AC
  Administered 2017-02-28: 30 mg via INTRAMUSCULAR
  Filled 2017-02-28: qty 2

## 2017-02-28 MED ORDER — CYCLOBENZAPRINE HCL 5 MG PO TABS: 5.0000 mg | ORAL_TABLET | Freq: Three times a day (TID) | ORAL | 0 refills | Status: AC | PRN
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
## 2017-06-04 DIAGNOSIS — K219 Gastro-esophageal reflux disease without esophagitis: Secondary | ICD-10-CM | POA: Diagnosis not present

## 2017-06-04 DIAGNOSIS — J4540 Moderate persistent asthma, uncomplicated: Secondary | ICD-10-CM | POA: Diagnosis not present

## 2017-06-04 DIAGNOSIS — D649 Anemia, unspecified: Secondary | ICD-10-CM | POA: Diagnosis not present

## 2017-06-04 DIAGNOSIS — Z125 Encounter for screening for malignant neoplasm of prostate: Secondary | ICD-10-CM | POA: Diagnosis not present

## 2017-06-04 DIAGNOSIS — M25561 Pain in right knee: Secondary | ICD-10-CM | POA: Diagnosis not present

## 2017-06-04 DIAGNOSIS — E538 Deficiency of other specified B group vitamins: Secondary | ICD-10-CM | POA: Diagnosis not present

## 2017-06-04 DIAGNOSIS — R739 Hyperglycemia, unspecified: Secondary | ICD-10-CM | POA: Diagnosis not present

## 2017-06-04 DIAGNOSIS — Z0000 Encounter for general adult medical examination without abnormal findings: Secondary | ICD-10-CM | POA: Diagnosis not present

## 2017-08-03 ENCOUNTER — Ambulatory Visit: Payer: Self-pay | Admitting: Emergency Medicine

## 2017-08-03 VITALS — BP 120/80 | HR 105 | Temp 98.5°F | Resp 16

## 2017-08-03 DIAGNOSIS — K0889 Other specified disorders of teeth and supporting structures: Secondary | ICD-10-CM

## 2017-08-03 MED ORDER — AMOXICILLIN 875 MG PO TABS: 875.0000 mg | ORAL_TABLET | Freq: Two times a day (BID) | ORAL | 0 refills | Status: DC

## 2017-08-03 NOTE — Progress Notes (Signed)
S: Is here with complaint of dental pain.  Patient is picking up a prescription for ibuprofen in the hospital pharmacy.  He states the side of his tooth broke off.  He is been in a lot of pain.  He denies any fever or chills.  He does not have a regular dentist. O: EACs and TMs are clear bilaterally.  Left lower molar is with a complete avulsion lateral aspect with [place] exposed.  Gum is tender.  Posterior pharynx clear.  Neck supple without adenopathy. A: Dental pain/dental injury. P: Amoxil 875 twice daily for 10 days.  Patient will be given a list of dental clinics in the area that are based on a sliding scale per income.  He will also begin taking his ibuprofen as needed for pain.

## 2017-09-16 DIAGNOSIS — J4540 Moderate persistent asthma, uncomplicated: Secondary | ICD-10-CM | POA: Diagnosis not present

## 2017-09-16 DIAGNOSIS — K219 Gastro-esophageal reflux disease without esophagitis: Secondary | ICD-10-CM | POA: Diagnosis not present

## 2017-09-16 DIAGNOSIS — K0889 Other specified disorders of teeth and supporting structures: Secondary | ICD-10-CM | POA: Diagnosis not present

## 2017-09-25 ENCOUNTER — Ambulatory Visit: Payer: Self-pay | Admitting: Nurse Practitioner

## 2017-09-25 VITALS — BP 135/64 | HR 97 | Temp 98.4°F | Wt 207.0 lb

## 2017-09-25 DIAGNOSIS — K529 Noninfective gastroenteritis and colitis, unspecified: Secondary | ICD-10-CM

## 2017-09-25 MED ORDER — ONDANSETRON HCL 4 MG PO TABS
4.0000 mg | ORAL_TABLET | Freq: Three times a day (TID) | ORAL | 0 refills | Status: AC | PRN
Start: 2017-09-25 — End: 2017-09-28

## 2017-09-25 MED ORDER — LOPERAMIDE HCL 2 MG PO TABS: 2.0000 mg | ORAL_TABLET | Freq: Four times a day (QID) | ORAL | 0 refills | Status: AC | PRN

## 2017-09-25 NOTE — Patient Instructions (Addendum)
Viral Gastroenteritis, Adult Viral gastroenteritis is also known as the stomach flu. This condition is caused by various viruses. These viruses can be passed from person to person very easily (are very contagious). This condition may affect your stomach, small intestine, and large intestine. It can cause sudden watery diarrhea, fever, and vomiting. Diarrhea and vomiting can make you feel weak and cause you to become dehydrated. You may not be able to keep fluids down. Dehydration can make you tired and thirsty, cause you to have a dry mouth, and decrease how often you urinate. Older adults and people with other diseases or a weak immune system are at higher risk for dehydration. It is important to replace the fluids that you lose from diarrhea and vomiting. If you become severely dehydrated, you may need to get fluids through an IV tube. What are the causes? Gastroenteritis is caused by various viruses, including rotavirus and norovirus. Norovirus is the most common cause in adults. You can get sick by eating food, drinking water, or touching a surface contaminated with one of these viruses. You can also get sick from sharing utensils or other personal items with an infected person. What increases the risk? This condition is more likely to develop in people:  Who have a weak defense system (immune system).  Who live with one or more children who are younger than 2 years old.  Who live in a nursing home.  Who go on cruise ships.  What are the signs or symptoms? Symptoms of this condition start suddenly 1-2 days after exposure to a virus. Symptoms may last a few days or as long as a week. The most common symptoms are watery diarrhea and vomiting. Other symptoms include:  Fever.  Headache.  Fatigue.  Pain in the abdomen.  Chills.  Weakness.  Nausea.  Muscle aches.  Loss of appetite.  How is this diagnosed? This condition is diagnosed with a medical history and physical exam. You  may also have a stool test to check for viruses or other infections. How is this treated? This condition typically goes away on its own. The focus of treatment is to restore lost fluids (rehydration). Your health care provider may recommend that you take an oral rehydration solution (ORS) to replace important salts and minerals (electrolytes) in your body. Severe cases of this condition may require giving fluids through an IV tube. Treatment may also include medicine to help with your symptoms. Follow these instructions at home: Follow instructions from your health care provider about how to care for yourself at home. Eating and drinking Follow these recommendations as told by your health care provider:  Take an ORS. This is a drink that is sold at pharmacies and retail stores.  Drink clear fluids in small amounts as you are able. Clear fluids include water, ice chips, diluted fruit juice, and low-calorie sports drinks.  Eat bland, easy-to-digest foods in small amounts as you are able. These foods include bananas, applesauce, rice, lean meats, toast, and crackers.  Avoid fluids that contain a lot of sugar or caffeine, such as energy drinks, sports drinks, and soda.  Avoid alcohol.  Avoid spicy or fatty foods.  General instructions   Drink enough fluid to keep your urine clear or pale yellow.  Wash your hands often. If soap and water are not available, use hand sanitizer.  Make sure that all people in your household wash their hands well and often.  Take over-the-counter and prescription medicines only as told by your health   care provider.  Rest at home while you recover.  Watch your condition for any changes.  Take a warm bath to relieve any burning or pain from frequent diarrhea episodes.  Keep all follow-up visits as told by your health care provider. This is important. Contact a health care provider if:  You cannot keep fluids down.  Your symptoms get worse.  You have  new symptoms.  You feel light-headed or dizzy.  You have muscle cramps. Get help right away if:  You have chest pain.  You feel extremely weak or you faint.  You see blood in your vomit.  Your vomit looks like coffee grounds.  You have bloody or black stools or stools that look like tar.  You have a severe headache, a stiff neck, or both.  You have a rash.  You have severe pain, cramping, or bloating in your abdomen.  You have trouble breathing or you are breathing very quickly.  Your heart is beating very quickly.  Your skin feels cold and clammy.  You feel confused.  You have pain when you urinate.  You have signs of dehydration, such as: ? Dark urine, very little urine, or no urine. ? Cracked lips. ? Dry mouth. ? Sunken eyes. ? Sleepiness. ? Weakness. This information is not intended to replace advice given to you by your health care provider. Make sure you discuss any questions you have with your health care provider. Document Released: 08/04/2005 Document Revised: 01/16/2016 Document Reviewed: 04/10/2015 Elsevier Interactive Patient Education  2018 Elsevier Inc.   Food Choices to Help Relieve Diarrhea, Adult When you have diarrhea, the foods you eat and your eating habits are very important. Choosing the right foods and drinks can help:  Relieve diarrhea.  Replace lost fluids and nutrients.  Prevent dehydration.  What general guidelines should I follow? Relieving diarrhea  Choose foods with less than 2 g or .07 oz. of fiber per serving.  Limit fats to less than 8 tsp (38 g or 1.34 oz.) a day.  Avoid the following: ? Foods and beverages sweetened with high-fructose corn syrup, honey, or sugar alcohols such as xylitol, sorbitol, and mannitol. ? Foods that contain a lot of fat or sugar. ? Fried, greasy, or spicy foods. ? High-fiber grains, breads, and cereals. ? Raw fruits and vegetables.  Eat foods that are rich in probiotics. These foods  include dairy products such as yogurt and fermented milk products. They help increase healthy bacteria in the stomach and intestines (gastrointestinal tract, or GI tract).  If you have lactose intolerance, avoid dairy products. These may make your diarrhea worse.  Take medicine to help stop diarrhea (antidiarrheal medicine) only as told by your health care provider. Replacing nutrients  Eat small meals or snacks every 3-4 hours.  Eat bland foods, such as white rice, toast, or baked potato, until your diarrhea starts to get better. Gradually reintroduce nutrient-rich foods as tolerated or as told by your health care provider. This includes: ? Well-cooked protein foods. ? Peeled, seeded, and soft-cooked fruits and vegetables. ? Low-fat dairy products.  Take vitamin and mineral supplements as told by your health care provider. Preventing dehydration   Start by sipping water or a special solution to prevent dehydration (oral rehydration solution, ORS). Urine that is clear or pale yellow means that you are getting enough fluid.  Try to drink at least 8-10 cups of fluid each day to help replace lost fluids.  You may add other liquids in addition to water,   such as clear juice or decaffeinated sports drinks, as tolerated or as told by your health care provider.  Avoid drinks with caffeine, such as coffee, tea, or soft drinks.  Avoid alcohol. What foods are recommended? The items listed may not be a complete list. Talk with your health care provider about what dietary choices are best for you. Grains White rice. White, French, or pita breads (fresh or toasted), including plain rolls, buns, or bagels. White pasta. Saltine, soda, or graham crackers. Pretzels. Low-fiber cereal. Cooked cereals made with water (such as cornmeal, farina, or cream cereals). Plain muffins. Matzo. Melba toast. Zwieback. Vegetables Potatoes (without the skin). Most well-cooked and canned vegetables without skins or  seeds. Tender lettuce. Fruits Apple sauce. Fruits canned in juice. Cooked apricots, cherries, grapefruit, peaches, pears, or plums. Fresh bananas and cantaloupe. Meats and other protein foods Baked or boiled chicken. Eggs. Tofu. Fish. Seafood. Smooth nut butters. Ground or well-cooked tender beef, ham, veal, lamb, pork, or poultry. Dairy Plain yogurt, kefir, and unsweetened liquid yogurt. Lactose-free milk, buttermilk, skim milk, or soy milk. Low-fat or nonfat hard cheese. Beverages Water. Low-calorie sports drinks. Fruit juices without pulp. Strained tomato and vegetable juices. Decaffeinated teas. Sugar-free beverages not sweetened with sugar alcohols. Oral rehydration solutions, if approved by your health care provider. Seasoning and other foods Bouillon, broth, or soups made from recommended foods. What foods are not recommended? The items listed may not be a complete list. Talk with your health care provider about what dietary choices are best for you. Grains Whole grain, whole wheat, bran, or rye breads, rolls, pastas, and crackers. Wild or brown rice. Whole grain or bran cereals. Barley. Oats and oatmeal. Corn tortillas or taco shells. Granola. Popcorn. Vegetables Raw vegetables. Fried vegetables. Cabbage, broccoli, Brussels sprouts, artichokes, baked beans, beet greens, corn, kale, legumes, peas, sweet potatoes, and yams. Potato skins. Cooked spinach and cabbage. Fruits Dried fruit, including raisins and dates. Raw fruits. Stewed or dried prunes. Canned fruits with syrup. Meat and other protein foods Fried or fatty meats. Deli meats. Chunky nut butters. Nuts and seeds. Beans and lentils. Bacon. Hot dogs. Sausage. Dairy High-fat cheeses. Whole milk, chocolate milk, and beverages made with milk, such as milk shakes. Half-and-half. Cream. sour cream. Ice cream. Beverages Caffeinated beverages (such as coffee, tea, soda, or energy drinks). Alcoholic beverages. Fruit juices with pulp.  Prune juice. Soft drinks sweetened with high-fructose corn syrup or sugar alcohols. High-calorie sports drinks. Fats and oils Butter. Cream sauces. Margarine. Salad oils. Plain salad dressings. Olives. Avocados. Mayonnaise. Sweets and desserts Sweet rolls, doughnuts, and sweet breads. Sugar-free desserts sweetened with sugar alcohols such as xylitol and sorbitol. Seasoning and other foods Honey. Hot sauce. Chili powder. Gravy. Cream-based or milk-based soups. Pancakes and waffles. Summary  When you have diarrhea, the foods you eat and your eating habits are very important.  Make sure you get at least 8-10 cups of fluid each day, or enough to keep your urine clear or pale yellow.  Eat bland foods and gradually reintroduce healthy, nutrient-rich foods as tolerated, or as told by your health care provider.  Avoid high-fiber, fried, greasy, or spicy foods. This information is not intended to replace advice given to you by your health care provider. Make sure you discuss any questions you have with your health care provider. Document Released: 10/25/2003 Document Revised: 08/01/2016 Document Reviewed: 08/01/2016 Elsevier Interactive Patient Education  2018 Elsevier Inc.  

## 2017-09-25 NOTE — Progress Notes (Signed)
Subjective:     Rosezena SensorChristopher Cullipher is a 42 y.o. male who presents for evaluation of diarrhea. Onset of diarrhea was this morning.  Diarrhea has occurred approximately 4 times per day. Patient describes diarrhea as watery and and brown. Diarrhea has been associated with abdominal pain described as "aggravating". Patient denies blood in stool, fever, illness in household contacts, recent antibiotic use, recent camping, recent travel, unintentional weight loss. Patient also denies urinary symptoms. Previous visits for diarrhea: none. Evaluation to date: none.  Treatment to date: patient has taken Loperamide 4mg .  The following portions of the patient's history were reviewed and updated as appropriate: allergies, current medications and past medical history.  Review of Systems Constitutional: positive for fatigue, negative for anorexia, chills, fevers and malaise Eyes: negative Respiratory: negative Cardiovascular: negative Gastrointestinal: positive for abdominal pain, diarrhea and nausea, negative for constipation, dysphagia, melena, reflux symptoms and vomiting    Objective:    BP 135/64   Pulse 97   Temp 98.4 F (36.9 C)   Wt 207 lb (93.9 kg)   SpO2 96%   BMI 27.31 kg/m  General: alert, cooperative, fatigued and no distress  Hydration:  well hydrated  Abdomen:    normal findings: bowel sounds normal, no masses palpable, symmetric and umbilicus normal and abnormal findings:  mild tenderness in the RUQ and in the RLQ    Assessment:    Diarrhea of uncertain etiology, mild in severity   Plan:    Appropriate educational material discussed and distributed. Discussed the appropriate management of diarrhea. Follow up as needed. Medications per orders. OTC antidiarrheals may be used judiciously.

## 2017-09-29 ENCOUNTER — Telehealth: Payer: Self-pay

## 2017-09-29 NOTE — Telephone Encounter (Signed)
Follow up call on visit 09/25/2017 Left message

## 2017-10-03 ENCOUNTER — Other Ambulatory Visit: Payer: Self-pay

## 2017-10-03 ENCOUNTER — Encounter: Payer: Self-pay | Admitting: Emergency Medicine

## 2017-10-03 ENCOUNTER — Emergency Department
Admission: EM | Admit: 2017-10-03 | Discharge: 2017-10-03 | Disposition: A | Discharge status: Home / Self Care | Payer: 59 | Attending: Emergency Medicine | Admitting: Emergency Medicine

## 2017-10-03 DIAGNOSIS — Z79899 Other long term (current) drug therapy: Secondary | ICD-10-CM | POA: Diagnosis not present

## 2017-10-03 DIAGNOSIS — R197 Diarrhea, unspecified: Secondary | ICD-10-CM | POA: Diagnosis not present

## 2017-10-03 DIAGNOSIS — F17210 Nicotine dependence, cigarettes, uncomplicated: Secondary | ICD-10-CM | POA: Insufficient documentation

## 2017-10-03 DIAGNOSIS — J45909 Unspecified asthma, uncomplicated: Secondary | ICD-10-CM | POA: Insufficient documentation

## 2017-10-03 DIAGNOSIS — R112 Nausea with vomiting, unspecified: Secondary | ICD-10-CM | POA: Insufficient documentation

## 2017-10-03 MED ORDER — ONDANSETRON 4 MG PO TBDP: 4.0000 mg | ORAL_TABLET | Freq: Three times a day (TID) | ORAL | 0 refills | Status: DC | PRN

## 2017-10-03 MED ORDER — ONDANSETRON 4 MG PO TBDP
4.0000 mg | ORAL_TABLET | Freq: Once | ORAL | Status: AC | PRN
  Administered 2017-10-03: 4 mg via ORAL
  Filled 2017-10-03: qty 1

## 2017-10-03 NOTE — ED Triage Notes (Signed)
Pt to ed with c/o vomiting after drinking pink lemonade. Pt vomited x 7.  Pt also states he is on ABX for infection in tooth.  Pt appears in nad at this time.  Pt denies diarrhea.

## 2017-10-03 NOTE — ED Provider Notes (Signed)
[facility] Emergency Department Provider Note  ____________________________________________   First MD Initiated Contact with Patient 10/03/17 1220     (approximate)  I have reviewed the triage vital signs and the nursing notes.   HISTORY  Chief Complaint Emesis    HPI Spencer Tanner is a 42 y.o. male presents emergency department stating he had several episodes of vomiting this morning and one episode of diarrhea since arriving to the emergency department.  He has been on antibiotic for tooth infection for 2 days.  He states his stomach still feels queasy.  He denies chest pain, shortness of breath, or cold symptoms.  He denies fever or chills at this time  Past Medical History:  Diagnosis Date  . Asthma     There are no active problems to display for this patient.   History reviewed. No pertinent surgical history.  Prior to Admission medications   Medication Sig Start Date End Date Taking? Authorizing Provider  albuterol (PROVENTIL HFA;VENTOLIN HFA) 108 (90 Base) MCG/ACT inhaler Inhale 2 puffs into the lungs every 6 (six) hours as needed. 07/30/16   [provider]  amoxicillin (AMOXIL) 875 MG tablet Take 1 tablet (875 mg total) by mouth 2 (two) times daily. Patient not taking: Reported on 09/25/2017 08/03/17   Tommi Rumps, PA-C  ibuprofen (ADVIL,MOTRIN) 600 MG tablet Take 1 tablet (600 mg total) by mouth every 6 (six) hours as needed. 02/28/17   Enid Derry, PA-C  iron polysaccharides (NU-IRON) 150 MG capsule Take by mouth. 01/27/17 01/27/18  [provider]  omeprazole (PRILOSEC) 20 MG capsule Take by mouth. 06/04/17 06/04/18  [provider]  ondansetron (ZOFRAN-ODT) 4 MG disintegrating tablet Take 1 tablet (4 mg total) by mouth every 8 (eight) hours as needed for nausea or vomiting. 10/03/17   Faythe Ghee, PA-C  SYMBICORT 160-4.5 MCG/ACT inhaler Inhale 2 puffs into the lungs 2 (two) times daily. 08/21/16    [provider]    Allergies Patient has no known allergies.  History reviewed. No pertinent family history.  Social History Social History   Tobacco Use  . Smoking status: Current Some Day Smoker  . Smokeless tobacco: Never Used  Substance Use Topics  . Alcohol use: No    Alcohol/week: 0.0 oz  . Drug use: No    Review of Systems  Constitutional: No fever/chills Eyes: No visual changes. ENT: No sore throat. Respiratory: Denies cough Gastrointestinal: Positive for vomiting and diarrhea Genitourinary: Negative for dysuria. Musculoskeletal: Negative for back pain. Skin: Negative for rash.    ____________________________________________   PHYSICAL EXAM:  VITAL SIGNS: ED Triage Vitals [10/03/17 1132]  Enc Vitals Group     BP 128/68     Pulse Rate 89     Resp 16     Temp 98 F (36.7 C)     Temp Source Oral     SpO2 100 %     Weight 207 lb (93.9 kg)     Height      Head Circumference      Peak Flow      Pain Score 0     Pain Loc      Pain Edu?      Excl. in GC?     Constitutional: Alert and oriented. Well appearing and in no acute distress. Eyes: Conjunctivae are normal.  Head: Atraumatic. Nose: No congestion/rhinnorhea. Mouth/Throat: Mucous membranes are moist.  Throat is normal Cardiovascular: Normal rate, regular rhythm.  Heart sounds are normal Respiratory:  Normal respiratory effort.  No retractions, lungs clear to auscultation Abdomen: Is soft, nontender, bowel sounds are normal in all 4 quads GU: deferred Musculoskeletal: FROM all extremities, warm and well perfused Neurologic:  Normal speech and language.  Skin:  Skin is warm, dry and intact. No rash noted. Psychiatric: Mood and affect are normal. Speech and behavior are normal.  ____________________________________________   LABS (all labs ordered are listed, but only abnormal results are displayed)  Labs Reviewed - No data to  display ____________________________________________   ____________________________________________  RADIOLOGY    ____________________________________________   PROCEDURES  Procedure(s) performed: No  Procedures    ____________________________________________   INITIAL IMPRESSION / ASSESSMENT AND PLAN / ED COURSE  Pertinent labs & imaging results that were available during my care of the patient were reviewed by me and considered in my medical decision making (see chart for details).  Patient is 42 year old male reporting to the emergency department for 7 episodes of vomiting this morning one episode of diarrhea.  He denies any fever chills cough or congestion.  He has been taking antibiotic for dental abscess.  He is only taking that for 2 days.  He denies any rash with this antibiotic.  This patient is well-known to myself from an outside clinic.  He appears well  On physical exam he appears well.  Lungs clear, there is no swelling of the face and his abdomen is soft, nontender and bowel sounds are normal   Diagnoses acute viral nausea, vomiting and diarrhea.  He was given a prescription for Zofran ODT.  He is to buy over-the-counter Imodium right ear as needed for the diarrhea.  He is to drink clear liquids.  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  This is hospital policy.  Patient states he understands, he is given a work note, he was given a prescription.  He was discharged in stable condition.  As part of my medical decision making, I reviewed the following data within the electronic MEDICAL RECORD NUMBER Nursing notes reviewed and incorporated, Old chart reviewed, Notes from prior ED visits and  Controlled Substance Database  ____________________________________________   FINAL CLINICAL IMPRESSION(S) / ED DIAGNOSES  Final diagnoses:  Nausea vomiting and diarrhea      NEW MEDICATIONS  STARTED DURING THIS VISIT:  Discharge Medication List as of 10/03/2017 12:29 PM    START taking these medications   Details  ondansetron (ZOFRAN-ODT) 4 MG disintegrating tablet Take 1 tablet (4 mg total) by mouth every 8 (eight) hours as needed for nausea or vomiting., Starting Sat 10/03/2017, Print         Note:  This document was prepared using Dragon voice recognition software and may include unintentional dictation errors.    Faythe GheeFisher, Susan W, PA-C 10/03/17 1451    Governor RooksLord, Rebecca, MD 10/03/17 [phone]

## 2017-10-03 NOTE — ED Notes (Signed)
Pt reports that he vomited x 7 at work today.  Pt states that he drank a pink lemonade and that it triggered his vomiting.  Pt states he has been on abx for tooth infection for less than a week.  Pt is A&Ox4, in NAD at this time.

## 2017-10-03 NOTE — Discharge Instructions (Signed)
Follow-up with your regular doctor if you are not better in 3 days.  Use medication for nausea and vomiting as needed.  If the diarrhea symptoms are worsening please buy over-the-counter Imodium.  Drink clear fluids today.  Stay on a brat diet tomorrow that includes bananas rice applesauce and toast.  Then slowly introduce foods into your system.  Stop the antibiotic for right now.  You may return to the antibiotic when you have no more vomiting or diarrhea.  He should not return to work until you have not had vomiting or diarrhea in 24-48 hours per hospital policy.

## 2017-10-03 NOTE — ED Notes (Signed)

## 2017-11-25 ENCOUNTER — Ambulatory Visit: Payer: Self-pay | Admitting: Family Medicine

## 2017-11-25 ENCOUNTER — Encounter: Payer: Self-pay | Admitting: Family Medicine

## 2017-11-25 DIAGNOSIS — K089 Disorder of teeth and supporting structures, unspecified: Secondary | ICD-10-CM

## 2017-11-25 DIAGNOSIS — K122 Cellulitis and abscess of mouth: Secondary | ICD-10-CM

## 2017-11-25 MED ORDER — CLINDAMYCIN HCL 150 MG PO CAPS: 150.0000 mg | ORAL_CAPSULE | Freq: Three times a day (TID) | ORAL | 0 refills | Status: AC

## 2017-11-25 NOTE — Progress Notes (Signed)
Patient ID: Spencer Tanner, male    DOB: [birth year], 42 y.o.   MRN: [id number]  PCP: Barbette Reichmann, MD  Chief Complaint  Patient presents with  . save-tooth pain left side x1d    Subjective:  HPI Spencer Tanner is a 42 y.o. male presents for evaluation of on-going tooth pain.Verlan was treated with amoxicillin back in February although had severe GI distress while taking medication and discontinued without completing the course of antibiotics. Today he reports two days of worsening left lower molar tooth pain. He reports due to cost he has not received any dental evaluation. Denies fever, chills, or facial swelling. Social History   Socioeconomic History  . Marital status: Single    Spouse name: Not on file  . Number of children: Not on file  . Years of education: Not on file  . Highest education level: Not on file  Occupational History  . Not on file  Social Needs  . Financial resource strain: Not on file  . Food insecurity:    Worry: Not on file    Inability: Not on file  . Transportation needs:    Medical: Not on file    Non-medical: Not on file  Tobacco Use  . Smoking status: Current Some Day Smoker  . Smokeless tobacco: Never Used  Substance and Sexual Activity  . Alcohol use: No    Alcohol/week: 0.0 oz  . Drug use: No  . Sexual activity: Not on file  Lifestyle  . Physical activity:    Days per week: Not on file    Minutes per session: Not on file  . Stress: Not on file  Relationships  . Social connections:    Talks on phone: Not on file    Gets together: Not on file    Attends religious service: Not on file    Active member of club or organization: Not on file    Attends meetings of clubs or organizations: Not on file    Relationship status: Not on file  . Intimate partner violence:    Fear of current or ex partner: Not on file    Emotionally abused: Not on file    Physically abused: Not on file    Forced sexual activity: Not on  file  Other Topics Concern  . Not on file  Social History Narrative  . Not on file    No family history on file.   Review of Systems Pertinent negatives listed in HPI  There are no active problems to display for this patient.   No Known Allergies  Prior to Admission medications   Medication Sig Start Date End Date Taking? Authorizing Provider  albuterol (PROVENTIL HFA;VENTOLIN HFA) 108 (90 Base) MCG/ACT inhaler Inhale 2 puffs into the lungs every 6 (six) hours as needed. 07/30/16  Yes [provider]  ibuprofen (ADVIL,MOTRIN) 600 MG tablet Take 1 tablet (600 mg total) by mouth every 6 (six) hours as needed. 02/28/17  Yes Enid Derry, PA-C  iron polysaccharides (NU-IRON) 150 MG capsule Take by mouth. 01/27/17 01/27/18 Yes [provider]  omeprazole (PRILOSEC) 20 MG capsule Take by mouth. 06/04/17 06/04/18 Yes [provider]  SYMBICORT 160-4.5 MCG/ACT inhaler Inhale 2 puffs into the lungs 2 (two) times daily. 08/21/16  Yes [provider]  amoxicillin (AMOXIL) 875 MG tablet Take 1 tablet (875 mg total) by mouth 2 (two) times daily. Patient not taking: Reported on 09/25/2017 08/03/17   Bridget Hartshorn L, PA-C  ondansetron (ZOFRAN-ODT) 4 MG disintegrating tablet  Take 1 tablet (4 mg total) by mouth every 8 (eight) hours as needed for nausea or vomiting. Patient not taking: Reported on 11/25/2017 10/03/17   Faythe GheeFisher, Susan W, PA-C    Past Medical, Surgical Family and Social History reviewed and updated.    Objective:  There were no vitals filed for this visit.  Wt Readings from Last 3 Encounters:  10/03/17 207 lb (93.9 kg)  09/25/17 207 lb (93.9 kg)  02/28/17 206 lb (93.4 kg)    Physical Exam  Constitutional: He is oriented to person, place, and time. He appears well-developed and well-nourished.  HENT:  Mouth/Throat: Abnormal dentition. Dental abscesses and dental caries present.    Pulmonary/Chest: Effort normal.  Neurological: He is alert  and oriented to person, place, and time.  Psychiatric: He has a normal mood and affect. His behavior is normal. Judgment and thought content normal.   Assessment & Plan:  1. Oral abscess 2. Poor dentition Will treat with an extended course of Clindamycin 150 mg TID x 14 days with food. Advise patient to consult a dentist immediately  If symptoms worsen or do not improve, return for follow-up, follow-up with PCP, or at the emergency department if severity of symptoms warrant a higher level of care.    Godfrey PickKimberly S. Tiburcio PeaHarris, RN, MSN, FNP-C [address]  [place], [place] [id number] [phone]

## 2017-11-25 NOTE — Patient Instructions (Signed)
Dental Caries Dental caries are spots of decay (cavities) in teeth. They are in the outer layer of your tooth (enamel). Treat them as soon as you can. If they are not treated, they can spread decay and lead to painful infection. Follow these instructions at home: General instructions  Take good care of your mouth and teeth. This keeps them healthy. ? Brush your teeth 2 times a day. Use toothpaste with fluoride in it. ? Floss your teeth once a day.  If your dentist prescribed an antibiotic medicine to treat an infection, take it as told. Do not stop taking the antibiotic even if your condition gets better.  Keep all follow-up visits as told by your dentist. This is important. This includes all cleanings. Preventing dental caries  Brush your teeth every morning and night. Use fluoride toothpaste.  Get regular dental cleanings.  If you are at risk of dental caries. ? Wash your mouth with prescription mouthwash (chlorhexidine). ? Put topical fluoride on your teeth.  Drink water with fluoride in it.  Drink water instead of sugary drinks.  Eat healthy meals and snacks. Contact a doctor if:  You have symptoms of tooth decay. Summary  Dental caries are spots of decay (cavities) in teeth. They are in the outer layer of your tooth.  Take an antibiotic to treat an infection, if told by your dentist. Do not stop taking the antibiotic even if your condition gets better.  Regular dental cleanings and brushing can help prevent dental caries. This information is not intended to replace advice given to you by your health care provider. Make sure you discuss any questions you have with your health care provider. Document Released: 05/13/2008 Document Revised: 04/20/2016 Document Reviewed: 04/20/2016 Elsevier Interactive Patient Education  2017 Elsevier Inc[employer] Dental Abscess A dental abscess is pus in or around a tooth. Follow these instructions at home:  Take medicines only as told by your  dentist.  If you were prescribed antibiotic medicine, finish all of it even if you start to feel better.  Rinse your mouth (gargle) often with salt water.  Do not drive or use heavy machinery, like a lawn mower, while taking pain medicine.  Do not apply heat to the outside of your mouth.  Keep all follow-up visits as told by your dentist. This is important. Contact a doctor if:  Your pain is worse, and medicine does not help. Get help right away if:  You have a fever or chills.  Your symptoms suddenly get worse.  You have a very bad headache.  You have problems breathing or swallowing.  You have trouble opening your mouth.  You have puffiness (swelling) in your neck or around your eye. This information is not intended to replace advice given to you by your health care provider. Make sure you discuss any questions you have with your health care provider. Document Released: 12/19/2014 Document Revised: 01/10/2016 Document Reviewed: 08/01/2014 Elsevier Interactive Patient Education  [employer]2018 Else[employer] Inc.

## 2018-06-22 DIAGNOSIS — J209 Acute bronchitis, unspecified: Secondary | ICD-10-CM | POA: Diagnosis not present

## 2018-06-22 DIAGNOSIS — J45909 Unspecified asthma, uncomplicated: Secondary | ICD-10-CM | POA: Diagnosis not present

## 2018-06-22 DIAGNOSIS — Z720 Tobacco use: Secondary | ICD-10-CM | POA: Diagnosis not present

## 2018-06-28 ENCOUNTER — Other Ambulatory Visit: Payer: Self-pay

## 2018-06-28 ENCOUNTER — Emergency Department
Admission: EM | Admit: 2018-06-28 | Discharge: 2018-06-28 | Disposition: A | Discharge status: Home / Self Care | Payer: 59 | Attending: Emergency Medicine | Admitting: Emergency Medicine

## 2018-06-28 DIAGNOSIS — H1033 Unspecified acute conjunctivitis, bilateral: Secondary | ICD-10-CM | POA: Diagnosis not present

## 2018-06-28 DIAGNOSIS — J45909 Unspecified asthma, uncomplicated: Secondary | ICD-10-CM | POA: Diagnosis not present

## 2018-06-28 DIAGNOSIS — B9689 Other specified bacterial agents as the cause of diseases classified elsewhere: Secondary | ICD-10-CM | POA: Diagnosis not present

## 2018-06-28 DIAGNOSIS — F17200 Nicotine dependence, unspecified, uncomplicated: Secondary | ICD-10-CM | POA: Diagnosis not present

## 2018-06-28 MED ORDER — TOBRAMYCIN 0.3 % OP SOLN: 2.0000 [drp] | OPHTHALMIC | 0 refills | Status: DC

## 2018-06-28 NOTE — Discharge Instructions (Addendum)
Begin using eyedrops to both eyes every 4 hours while awake.  Wash hands immediately after putting the eyedrops in.  You will need to be out of work until your eyes have cleared completely.  Follow-up with your primary care provider if any continued problems.

## 2018-06-28 NOTE — ED Triage Notes (Signed)
Patient reports eye redness, reports children recently with pink eye.

## 2018-06-28 NOTE — ED Notes (Signed)
See triage note  Presents with bilateral eye redness and irritation  Denies any drainage and states his eyes were not matted

## 2018-06-28 NOTE — ED Provider Notes (Signed)
[facility] Emergency Department Provider Note  ____________________________________________   First MD Initiated Contact with Patient 06/28/18 [phone]     (approximate)  I have reviewed the triage vital signs and the nursing notes.   HISTORY  Chief Complaint Conjunctivitis   HPI Spencer Tanner is a 42 y.o. male presents to the ED with complaint of conjunctivitis.  Patient states that his eyes began getting "pink" over the weekend.  He has children at home that have the pinkeye.  He states that his began yesterday and he used some of the drops left over from his son's medication.  He states they were slightly crusty this morning but that the eyedrops have helped some.  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  Past Medical History:  Diagnosis Date  . Asthma     There are no active problems to display for this patient.   No past surgical history on file.  Prior to Admission medications   Medication Sig Start Date End Date Taking? Authorizing Provider  albuterol (PROVENTIL HFA;VENTOLIN HFA) 108 (90 Base) MCG/ACT inhaler Inhale 2 puffs into the lungs every 6 (six) hours as needed. 07/30/16   [provider]  SYMBICORT 160-4.5 MCG/ACT inhaler Inhale 2 puffs into the lungs 2 (two) times daily. 08/21/16   [provider]  tobramycin (TOBREX) 0.3 % ophthalmic solution Place 2 drops into the left eye every 4 (four) hours. 06/28/18   Tommi Rumps, PA-C    Allergies Amoxicillin  No family history on file.  Social History Social History   Tobacco Use  . Smoking status: Current Some Day Smoker  . Smokeless tobacco: Never Used  Substance Use Topics  . Alcohol use: No    Alcohol/week: 0.0 standard drinks  . Drug use: No    Review of Systems Constitutional: No fever/chills Eyes: Red and positive drainage. ENT: No sore throat. Cardiovascular: Denies chest  pain. Respiratory: Denies shortness of breath. Musculoskeletal: Negative for muscle aches. Skin: Negative for rash. Neurological: Negative for headaches, focal weakness or numbness. ___________________________________________   PHYSICAL EXAM:  VITAL SIGNS: ED Triage Vitals  Enc Vitals Group     BP 06/28/18 0702 (!) 147/79     Pulse Rate 06/28/18 0702 66     Resp 06/28/18 0702 18     Temp 06/28/18 0702 98.1 F (36.7 C)     Temp Source 06/28/18 0702 Oral     SpO2 06/28/18 0702 98 %     Weight 06/28/18 0701 190 lb (86.2 kg)     Height 06/28/18 0701 6\' 1"  (1.854 m)     Head Circumference --      Peak Flow --      Pain Score 06/28/18 0700 0     Pain Loc --      Pain Edu? --      Excl. in GC? --    Constitutional: Alert and oriented. Well appearing and in no acute distress. Eyes: Conjunctivae are pinkish-red bilaterally.  PERRL. EOMI. no drainage at present.  No foreign body noted. Head: Atraumatic. Nose: No congestion/rhinnorhea. Neck: No stridor.   Cardiovascular: Normal rate, regular rhythm. Grossly normal heart sounds.  Good peripheral circulation. Respiratory: Normal respiratory effort.  No retractions. Lungs CTAB. Musculoskeletal: Moves upper and lower extremities that any difficulty.  Normal gait was noted. Neurologic:  Normal speech and language. No gross focal neurologic deficits are appreciated. No gait instability. Skin:  Skin  is warm, dry and intact.  Psychiatric: Mood and affect are normal. Speech and behavior are normal.  ____________________________________________   LABS (all labs ordered are listed, but only abnormal results are displayed)  Labs Reviewed - No data to display   PROCEDURES  Procedure(s) performed: None  Procedures  Critical Care performed: No  ____________________________________________   INITIAL IMPRESSION / ASSESSMENT AND PLAN / ED COURSE  As part of my medical decision making, I reviewed the following data within the  electronic MEDICAL RECORD NUMBER Notes from prior ED visits and [place] Controlled Substance Database  Patient presents to the ED with complaint of bilateral eye redness.  Children in his family were recently diagnosed with pinkeye.  He states that was worse last evening but he used his son's medication and is slightly better this morning.  He continues to have symptoms and his supervisor sent him to the ED for evaluation.  Patient was given a prescription for tobramycin every 4 hours while awake.  He was also given a note to remain out of work until 11/13 at which time he should be better or should follow-up with his PCP.  ____________________________________________   FINAL CLINICAL IMPRESSION(S) / ED DIAGNOSES  Final diagnoses:  Acute bacterial conjunctivitis of both eyes     ED Discharge Orders         Ordered    tobramycin (TOBREX) 0.3 % ophthalmic solution  Every 4 hours     06/28/18 0725           Note:  This document was prepared using Dragon voice recognition software and may include unintentional dictation errors.    Tommi Rumps, PA-C 06/28/18 1610    Sharyn Creamer, MD 06/28/18 [phone]

## 2018-08-18 DIAGNOSIS — Z87891 Personal history of nicotine dependence: Secondary | ICD-10-CM

## 2018-08-18 HISTORY — DX: Personal history of nicotine dependence: Z87.891

## 2018-11-22 MED FILL — ALBUTEROL SULFATE HFA 108 (: 108 (90 BAS | 25 days supply | Qty: 18 | Fill #0 | Status: TO

## 2018-11-22 MED FILL — SYMBICORT 160-4.5 MCG INH: 160-4.5 | 30 days supply | Qty: 10 | Fill #0 | Status: TO

## 2019-01-26 DIAGNOSIS — Z0000 Encounter for general adult medical examination without abnormal findings: Secondary | ICD-10-CM | POA: Diagnosis not present

## 2019-01-26 DIAGNOSIS — J45909 Unspecified asthma, uncomplicated: Secondary | ICD-10-CM | POA: Diagnosis not present

## 2019-01-26 DIAGNOSIS — Z720 Tobacco use: Secondary | ICD-10-CM | POA: Diagnosis not present

## 2019-01-26 DIAGNOSIS — Z125 Encounter for screening for malignant neoplasm of prostate: Secondary | ICD-10-CM | POA: Diagnosis not present

## 2019-02-02 DIAGNOSIS — J4520 Mild intermittent asthma, uncomplicated: Secondary | ICD-10-CM | POA: Diagnosis not present

## 2019-02-02 DIAGNOSIS — Z0000 Encounter for general adult medical examination without abnormal findings: Secondary | ICD-10-CM | POA: Diagnosis not present

## 2019-02-02 DIAGNOSIS — E538 Deficiency of other specified B group vitamins: Secondary | ICD-10-CM | POA: Diagnosis not present

## 2019-02-02 DIAGNOSIS — K219 Gastro-esophageal reflux disease without esophagitis: Secondary | ICD-10-CM | POA: Diagnosis not present

## 2019-06-05 ENCOUNTER — Emergency Department
Admission: EM | Admit: 2019-06-05 | Discharge: 2019-06-05 | Disposition: A | Discharge status: Home / Self Care | Payer: 59 | Attending: Emergency Medicine | Admitting: Emergency Medicine

## 2019-06-05 ENCOUNTER — Encounter: Payer: Self-pay | Admitting: Emergency Medicine

## 2019-06-05 ENCOUNTER — Other Ambulatory Visit: Payer: Self-pay

## 2019-06-05 DIAGNOSIS — F17210 Nicotine dependence, cigarettes, uncomplicated: Secondary | ICD-10-CM | POA: Diagnosis not present

## 2019-06-05 DIAGNOSIS — R200 Anesthesia of skin: Secondary | ICD-10-CM | POA: Diagnosis present

## 2019-06-05 DIAGNOSIS — Z79899 Other long term (current) drug therapy: Secondary | ICD-10-CM | POA: Insufficient documentation

## 2019-06-05 DIAGNOSIS — J45909 Unspecified asthma, uncomplicated: Secondary | ICD-10-CM | POA: Diagnosis not present

## 2019-06-05 DIAGNOSIS — M5416 Radiculopathy, lumbar region: Secondary | ICD-10-CM | POA: Insufficient documentation

## 2019-06-05 MED ORDER — DEXAMETHASONE SODIUM PHOSPHATE 10 MG/ML IJ SOLN
10.0000 mg | Freq: Once | INTRAMUSCULAR | Status: AC
  Administered 2019-06-05: 10 mg via INTRAMUSCULAR
  Filled 2019-06-05: qty 1

## 2019-06-05 MED ORDER — METHOCARBAMOL 500 MG PO TABS: 500.0000 mg | ORAL_TABLET | Freq: Three times a day (TID) | ORAL | 1 refills | Status: DC

## 2019-06-05 MED ORDER — PREDNISONE 10 MG PO TABS: ORAL_TABLET | ORAL | 0 refills | Status: DC

## 2019-06-05 NOTE — ED Notes (Signed)
Pt states that on his left leg on the outer portion there is a pain that shoots all the way down- pt states when he is active it gets better

## 2019-06-05 NOTE — ED Triage Notes (Signed)
Pt to ED via POV c/o pain in his left leg since Friday. Pt is ambulatory to triage without difficulty. Pt is in NAD.

## 2019-06-05 NOTE — ED Provider Notes (Signed)
[facility] EMERGENCY DEPARTMENT Provider Note   CSN: [id number] Arrival date & time: 06/05/19  1619     History   Chief Complaint Chief Complaint  Patient presents with  . Leg Pain    HPI Spencer Tanner is a 43 y.o. male presents to the emergency department evaluation of pain burning numbness and tingling shooting down his left posterior lateral thigh into the posterior lateral calf.  Symptoms been present for 2 days.  No trauma or injury.  He denies any saddle anesthesia, loss of bowel or bladder symptoms.  He is ambulatory with no assistive devices.  Is been taken occasional Tylenol and ibuprofen with little relief.  His pain is 10 out of 10.  Denies any weakness.  No back pain.  No fevers, abdominal pain, urinary symptoms nausea or vomiting.     HPI  Past Medical History:  Diagnosis Date  . Asthma     There are no active problems to display for this patient.   History reviewed. No pertinent surgical history.      Home Medications    Prior to Admission medications   Medication Sig Start Date End Date Taking? Authorizing Provider  albuterol (PROVENTIL HFA;VENTOLIN HFA) 108 (90 Base) MCG/ACT inhaler Inhale 2 puffs into the lungs every 6 (six) hours as needed. 07/30/16   [provider]  methocarbamol (ROBAXIN) 500 MG tablet Take 1 tablet (500 mg total) by mouth 3 (three) times daily. 06/05/19   Evon Slack, PA-C  predniSONE (DELTASONE) 10 MG tablet 10 day taper. 5,5,4,4,3,3,2,2,1,1 06/05/19   Evon Slack, PA-C  SYMBICORT 160-4.5 MCG/ACT inhaler Inhale 2 puffs into the lungs 2 (two) times daily. 08/21/16   [provider]  tobramycin (TOBREX) 0.3 % ophthalmic solution Place 2 drops into the left eye every 4 (four) hours. 06/28/18   Tommi Rumps, PA-C    Family History No family history on file.  Social History Social History   Tobacco Use  . Smoking status: Current Some Day Smoker  . Smokeless tobacco:  Never Used  Substance Use Topics  . Alcohol use: No    Alcohol/week: 0.0 standard drinks  . Drug use: No     Allergies   Amoxicillin   Review of Systems Review of Systems  Constitutional: Negative for fever.  Cardiovascular: Negative for leg swelling.  Gastrointestinal: Negative for diarrhea, nausea and vomiting.  Genitourinary: Negative for dysuria.  Musculoskeletal: Positive for back pain. Negative for gait problem, joint swelling, myalgias and neck pain.  Neurological: Positive for numbness. Negative for weakness.     Physical Exam Updated Vital Signs BP (!) 157/85 (BP Location: Left Arm)   Pulse 94   Temp 98.3 F (36.8 C) (Oral)   Resp 18   Ht 6' (1.829 m)   Wt 89.8 kg   SpO2 99%   BMI 26.85 kg/m   Physical Exam Constitutional:      Appearance: He is well-developed.  HENT:     Head: Normocephalic and atraumatic.  Eyes:     Conjunctiva/sclera: Conjunctivae normal.  Neck:     Musculoskeletal: Normal range of motion.  Cardiovascular:     Rate and Rhythm: Normal rate.  Pulmonary:     Effort: Pulmonary effort is normal. No respiratory distress.  Musculoskeletal: Normal range of motion.     Comments: Lumbar Spine: Examination of the lumbar spine reveals no bony abnormality, no edema, and no ecchymosis.  There is no step off.  The patient has full range of  motion of the lumbar spine with flexion and extension.  The patient has normal lateral bend and rotation.  The patient has no pain with range of motion activities.  The patient has a negative axial load test, and a negative rotational Waddell test.  The patient is non tender along the spinous process.  The patient is non tender along the paravertebral muscles, with no muscle spasms.  The patient is non tender along the iliac crest.  The patient is non tender in the sciatic notch.  The patient is non tender along the Sacroiliac joint.  There is no Coccyx joint tenderness.    Bilateral Lower Extremities: Examination  of the lower extremities reveals no bony abnormality, no edema, and no ecchymosis.  The patient has full active and passive range of motion of the hips, knees, and ankles.  There is no discomfort with range of motion exercises of the hip with internal/external rotation.  The patient is non tender along the greater trochanter region.  The patient has a negative Bevelyn Buckles' test bilaterally.  There is normal skin warmth.  There is normal capillary refill bilaterally.    Neurologic: The patient has a negative straight leg raise.  The patient has normal muscle strength testing for the quadriceps, calves, ankle dorsiflexion, ankle plantarflexion, and extensor hallicus longus.  The patient has sensation that is intact to light touch.  The deep tendon reflexes are normal bilaterally.  No clonus noted.   Skin:    General: Skin is warm.     Findings: No rash.  Neurological:     Mental Status: He is alert and oriented to person, place, and time.  Psychiatric:        Behavior: Behavior normal.        Thought Content: Thought content normal.      ED Treatments / Results  Labs (all labs ordered are listed, but only abnormal results are displayed) Labs Reviewed - No data to display  EKG None  Radiology No results found.  Procedures Procedures (including critical care time)  Medications Ordered in ED Medications  dexamethasone (DECADRON) injection 10 mg (has no administration in time range)     Initial Impression / Assessment and Plan / ED Course  I have reviewed the triage vital signs and the nursing notes.  Pertinent labs & imaging results that were available during my care of the patient were reviewed by me and considered in my medical decision making (see chart for details).        43 year old male with left lumbar radicular symptoms.  No neurological deficits.  No trauma or injury.  No spinous process tenderness.  Hip moves well with no discomfort and full range of motion with passive  range of motion.  Knee appears well with no signs of effusion or infection.  No swelling or edema throughout the left leg.  History and exam consistent with lumbar radiculopathy.  Patient's vital signs are stable.  He is placed on prednisone taper and given muscle relaxer.  He will follow-up with PCP or orthopedics if no improvement 1 week.  He understands signs symptoms return to ED for.  Final Clinical Impressions(s) / ED Diagnoses   Final diagnoses:  Left lumbar radiculopathy    ED Discharge Orders         Ordered    predniSONE (DELTASONE) 10 MG tablet     06/05/19 1752    methocarbamol (ROBAXIN) 500 MG tablet  3 times daily     06/05/19 1752  Evon SlackGaines, Daven Pinckney C, PA-C 06/05/19 1756    Concha SeFunke, Mary E, MD 06/05/19 [phone]

## 2019-06-05 NOTE — Discharge Instructions (Signed)
Please take medications as prescribed.  You may take Tylenol for additional pain relief.  Follow-up with primary care provider or orthopedics in 7 to 10 days if no improvement.

## 2019-06-06 DIAGNOSIS — J4540 Moderate persistent asthma, uncomplicated: Secondary | ICD-10-CM | POA: Diagnosis not present

## 2019-06-06 DIAGNOSIS — M5432 Sciatica, left side: Secondary | ICD-10-CM | POA: Diagnosis not present

## 2019-06-06 DIAGNOSIS — K219 Gastro-esophageal reflux disease without esophagitis: Secondary | ICD-10-CM | POA: Diagnosis not present

## 2019-06-06 DIAGNOSIS — J4520 Mild intermittent asthma, uncomplicated: Secondary | ICD-10-CM | POA: Diagnosis not present

## 2019-06-10 ENCOUNTER — Other Ambulatory Visit: Payer: Self-pay | Admitting: Internal Medicine

## 2019-06-10 ENCOUNTER — Other Ambulatory Visit: Payer: Self-pay

## 2019-06-10 ENCOUNTER — Ambulatory Visit
Admission: RE | Admit: 2019-06-10 | Discharge: 2019-06-10 | Disposition: A | Discharge status: Home / Self Care | Payer: 59 | Source: Ambulatory Visit | Attending: Internal Medicine | Admitting: Internal Medicine

## 2019-06-10 ENCOUNTER — Other Ambulatory Visit (HOSPITAL_COMMUNITY): Payer: Self-pay | Admitting: Internal Medicine

## 2019-06-10 DIAGNOSIS — S76312S Strain of muscle, fascia and tendon of the posterior muscle group at thigh level, left thigh, sequela: Secondary | ICD-10-CM

## 2019-06-10 DIAGNOSIS — M792 Neuralgia and neuritis, unspecified: Secondary | ICD-10-CM

## 2019-06-10 DIAGNOSIS — M79605 Pain in left leg: Secondary | ICD-10-CM | POA: Insufficient documentation

## 2019-06-10 DIAGNOSIS — M7989 Other specified soft tissue disorders: Secondary | ICD-10-CM

## 2019-06-10 DIAGNOSIS — S76312D Strain of muscle, fascia and tendon of the posterior muscle group at thigh level, left thigh, subsequent encounter: Secondary | ICD-10-CM | POA: Diagnosis not present

## 2019-06-10 DIAGNOSIS — R600 Localized edema: Secondary | ICD-10-CM | POA: Diagnosis not present

## 2019-06-10 DIAGNOSIS — M1612 Unilateral primary osteoarthritis, left hip: Secondary | ICD-10-CM | POA: Diagnosis not present

## 2019-06-10 DIAGNOSIS — J4520 Mild intermittent asthma, uncomplicated: Secondary | ICD-10-CM | POA: Diagnosis not present

## 2019-06-10 DIAGNOSIS — M79652 Pain in left thigh: Secondary | ICD-10-CM | POA: Diagnosis not present

## 2019-06-14 ENCOUNTER — Other Ambulatory Visit: Payer: Self-pay

## 2019-06-14 ENCOUNTER — Ambulatory Visit
Admission: RE | Admit: 2019-06-14 | Discharge: 2019-06-14 | Disposition: A | Discharge status: Home / Self Care | Payer: 59 | Source: Ambulatory Visit | Attending: Internal Medicine | Admitting: Internal Medicine

## 2019-06-14 DIAGNOSIS — S76312S Strain of muscle, fascia and tendon of the posterior muscle group at thigh level, left thigh, sequela: Secondary | ICD-10-CM | POA: Diagnosis not present

## 2019-06-14 DIAGNOSIS — M79652 Pain in left thigh: Secondary | ICD-10-CM | POA: Diagnosis not present

## 2019-06-16 DIAGNOSIS — M47816 Spondylosis without myelopathy or radiculopathy, lumbar region: Secondary | ICD-10-CM | POA: Diagnosis not present

## 2019-06-16 DIAGNOSIS — M5137 Other intervertebral disc degeneration, lumbosacral region: Secondary | ICD-10-CM | POA: Diagnosis not present

## 2019-06-16 DIAGNOSIS — M79605 Pain in left leg: Secondary | ICD-10-CM | POA: Diagnosis not present

## 2019-06-17 ENCOUNTER — Other Ambulatory Visit: Payer: Self-pay | Admitting: Sports Medicine

## 2019-06-17 DIAGNOSIS — M79605 Pain in left leg: Secondary | ICD-10-CM

## 2019-06-29 ENCOUNTER — Ambulatory Visit: Admission: RE | Admit: 2019-06-29 | Payer: 59 | Source: Ambulatory Visit

## 2019-11-22 ENCOUNTER — Other Ambulatory Visit: Payer: Self-pay | Admitting: Internal Medicine

## 2019-11-22 DIAGNOSIS — J4540 Moderate persistent asthma, uncomplicated: Secondary | ICD-10-CM | POA: Diagnosis not present

## 2019-11-22 DIAGNOSIS — K029 Dental caries, unspecified: Secondary | ICD-10-CM | POA: Diagnosis not present

## 2019-11-22 DIAGNOSIS — K219 Gastro-esophageal reflux disease without esophagitis: Secondary | ICD-10-CM | POA: Diagnosis not present

## 2020-04-18 DIAGNOSIS — J029 Acute pharyngitis, unspecified: Secondary | ICD-10-CM | POA: Diagnosis not present

## 2020-04-18 DIAGNOSIS — K029 Dental caries, unspecified: Secondary | ICD-10-CM | POA: Diagnosis not present

## 2020-04-20 ENCOUNTER — Other Ambulatory Visit: Payer: Self-pay | Admitting: Internal Medicine

## 2020-05-06 ENCOUNTER — Emergency Department: Payer: 59

## 2020-05-06 ENCOUNTER — Encounter: Payer: Self-pay | Admitting: Emergency Medicine

## 2020-05-06 ENCOUNTER — Other Ambulatory Visit: Payer: Self-pay

## 2020-05-06 DIAGNOSIS — J90 Pleural effusion, not elsewhere classified: Secondary | ICD-10-CM | POA: Diagnosis not present

## 2020-05-06 DIAGNOSIS — R0989 Other specified symptoms and signs involving the circulatory and respiratory systems: Secondary | ICD-10-CM | POA: Insufficient documentation

## 2020-05-06 DIAGNOSIS — R0789 Other chest pain: Secondary | ICD-10-CM | POA: Diagnosis not present

## 2020-05-06 DIAGNOSIS — Z5321 Procedure and treatment not carried out due to patient leaving prior to being seen by health care provider: Secondary | ICD-10-CM | POA: Diagnosis not present

## 2020-05-06 DIAGNOSIS — I10 Essential (primary) hypertension: Secondary | ICD-10-CM | POA: Diagnosis not present

## 2020-05-06 LAB — CBC
HCT: 43 % (ref 39.0–52.0)
Hemoglobin: 14.7 g/dL (ref 13.0–17.0)
MCH: 30.1 pg (ref 26.0–34.0)
MCHC: 34.2 g/dL (ref 30.0–36.0)
MCV: 87.9 fL (ref 80.0–100.0)
Platelets: 279 10*3/uL (ref 150–400)
RBC: 4.89 MIL/uL (ref 4.22–5.81)
RDW: 13.5 % (ref 11.5–15.5)
WBC: 7.6 10*3/uL (ref 4.0–10.5)
nRBC: 0 % (ref 0.0–0.2)

## 2020-05-06 LAB — TROPONIN I (HIGH SENSITIVITY): Troponin I (High Sensitivity): 4 ng/L (ref <18)

## 2020-05-06 LAB — BASIC METABOLIC PANEL
Anion gap: 10 (ref 5–15)
BUN: 8 mg/dL (ref 6–20)
CO2: 25 mmol/L (ref 22–32)
Calcium: 8.7 mg/dL — ABNORMAL LOW (ref 8.9–10.3)
Chloride: 103 mmol/L (ref 98–111)
Creatinine, Ser: 1.24 mg/dL (ref 0.61–1.24)
GFR calc Af Amer: >60 mL/min (ref >60)
GFR calc non Af Amer: >60 mL/min (ref >60)
Glucose, Bld: 88 mg/dL (ref 70–99)
Potassium: 3.2 mmol/L — ABNORMAL LOW (ref 3.5–5.1)
Sodium: 138 mmol/L (ref 135–145)

## 2020-05-06 NOTE — ED Notes (Signed)
Pt reports that he is continuing to experience a tightness in his chest st this time.

## 2020-05-06 NOTE — ED Triage Notes (Signed)
Pt arrives ambulatory to triage with c/o of eating boneless chicken wings and taking too big of bites. Pt states that he feels nothing in his airway but feel weird where one of his bites was going down slowly. Pt is in NAD.

## 2020-05-07 ENCOUNTER — Emergency Department
Admission: EM | Admit: 2020-05-07 | Discharge: 2020-05-07 | Disposition: A | Discharge status: Home / Self Care | Payer: 59 | Attending: Emergency Medicine | Admitting: Emergency Medicine

## 2020-05-07 NOTE — ED Notes (Signed)
Pt up to desk and states he is feeling better and is going home. Pt encouraged to stay and see the doctor but did not wish to wait any longer.

## 2020-05-28 ENCOUNTER — Other Ambulatory Visit: Payer: Self-pay | Admitting: Internal Medicine

## 2020-05-28 DIAGNOSIS — Z0000 Encounter for general adult medical examination without abnormal findings: Secondary | ICD-10-CM | POA: Diagnosis not present

## 2020-05-28 DIAGNOSIS — K219 Gastro-esophageal reflux disease without esophagitis: Secondary | ICD-10-CM | POA: Diagnosis not present

## 2020-05-28 DIAGNOSIS — Z1331 Encounter for screening for depression: Secondary | ICD-10-CM | POA: Diagnosis not present

## 2020-05-28 DIAGNOSIS — J4520 Mild intermittent asthma, uncomplicated: Secondary | ICD-10-CM | POA: Diagnosis not present

## 2020-05-28 DIAGNOSIS — J4540 Moderate persistent asthma, uncomplicated: Secondary | ICD-10-CM | POA: Diagnosis not present

## 2020-07-24 ENCOUNTER — Other Ambulatory Visit: Payer: Self-pay | Admitting: Physician Assistant

## 2020-07-24 DIAGNOSIS — K0889 Other specified disorders of teeth and supporting structures: Secondary | ICD-10-CM | POA: Diagnosis not present

## 2020-08-19 ENCOUNTER — Other Ambulatory Visit: Payer: Self-pay

## 2020-08-19 DIAGNOSIS — R079 Chest pain, unspecified: Secondary | ICD-10-CM | POA: Diagnosis not present

## 2020-08-19 DIAGNOSIS — J45909 Unspecified asthma, uncomplicated: Secondary | ICD-10-CM | POA: Diagnosis not present

## 2020-08-19 DIAGNOSIS — Z5321 Procedure and treatment not carried out due to patient leaving prior to being seen by health care provider: Secondary | ICD-10-CM | POA: Diagnosis not present

## 2020-08-19 NOTE — ED Triage Notes (Signed)
Reports central chest tightness that started approx 1hour ago. 10/10. Reports hx of asthma. No known COVID exposures; no SOB. Pt alert and oriented X4, cooperative, RR even and unlabored, color WNL. Pt in NAD.

## 2020-08-20 ENCOUNTER — Emergency Department
Admission: EM | Admit: 2020-08-20 | Discharge: 2020-08-20 | Disposition: A | Discharge status: Home / Self Care | Payer: 59 | Attending: Emergency Medicine | Admitting: Emergency Medicine

## 2020-08-20 ENCOUNTER — Emergency Department: Payer: 59

## 2020-08-20 ENCOUNTER — Encounter: Payer: Self-pay | Admitting: Radiology

## 2020-08-20 DIAGNOSIS — Z5321 Procedure and treatment not carried out due to patient leaving prior to being seen by health care provider: Secondary | ICD-10-CM | POA: Diagnosis not present

## 2020-08-20 DIAGNOSIS — R079 Chest pain, unspecified: Secondary | ICD-10-CM | POA: Diagnosis not present

## 2020-08-20 DIAGNOSIS — J45909 Unspecified asthma, uncomplicated: Secondary | ICD-10-CM | POA: Diagnosis not present

## 2020-08-20 LAB — BASIC METABOLIC PANEL
Anion gap: 9 (ref 5–15)
BUN: 10 mg/dL (ref 6–20)
CO2: 29 mmol/L (ref 22–32)
Calcium: 9.3 mg/dL (ref 8.9–10.3)
Chloride: 104 mmol/L (ref 98–111)
Creatinine, Ser: 1.04 mg/dL (ref 0.61–1.24)
GFR, Estimated: >60 mL/min (ref >60)
Glucose, Bld: 95 mg/dL (ref 70–99)
Potassium: 4.2 mmol/L (ref 3.5–5.1)
Sodium: 142 mmol/L (ref 135–145)

## 2020-08-20 LAB — CBC
HCT: 44.1 % (ref 39.0–52.0)
Hemoglobin: 15.4 g/dL (ref 13.0–17.0)
MCH: 30.4 pg (ref 26.0–34.0)
MCHC: 34.9 g/dL (ref 30.0–36.0)
MCV: 87 fL (ref 80.0–100.0)
Platelets: 299 10*3/uL (ref 150–400)
RBC: 5.07 MIL/uL (ref 4.22–5.81)
RDW: 13.3 % (ref 11.5–15.5)
WBC: 6.8 10*3/uL (ref 4.0–10.5)
nRBC: 0 % (ref 0.0–0.2)

## 2020-08-20 LAB — TROPONIN I (HIGH SENSITIVITY): Troponin I (High Sensitivity): 10 ng/L (ref <18)

## 2020-08-20 NOTE — ED Notes (Signed)
Pt to stat desk asking about results.  Told pt we cant give out results.  States he has to get up for work in the morning and he is now pain free.  Advised to return if sx return or change.  Pt agreeable.  Ambulated out of ED lobby

## 2020-10-10 ENCOUNTER — Other Ambulatory Visit: Payer: Self-pay

## 2020-11-20 ENCOUNTER — Other Ambulatory Visit: Payer: Self-pay

## 2020-11-20 DIAGNOSIS — K029 Dental caries, unspecified: Secondary | ICD-10-CM | POA: Diagnosis not present

## 2020-11-20 DIAGNOSIS — K219 Gastro-esophageal reflux disease without esophagitis: Secondary | ICD-10-CM | POA: Diagnosis not present

## 2020-11-20 DIAGNOSIS — K0510 Chronic gingivitis, plaque induced: Secondary | ICD-10-CM | POA: Diagnosis not present

## 2020-11-20 DIAGNOSIS — J4520 Mild intermittent asthma, uncomplicated: Secondary | ICD-10-CM | POA: Diagnosis not present

## 2020-11-20 MED ORDER — CLINDAMYCIN HCL 300 MG PO CAPS
ORAL_CAPSULE | ORAL | 0 refills | Status: DC
  Filled 2020-11-20: qty 30, 10d supply, fill #0

## 2020-11-20 MED ORDER — IBUPROFEN 800 MG PO TABS
ORAL_TABLET | ORAL | 2 refills | Status: DC
  Filled 2020-11-20: qty 60, 30d supply, fill #0

## 2020-12-03 ENCOUNTER — Other Ambulatory Visit: Payer: Self-pay

## 2020-12-03 MED ORDER — ALBUTEROL SULFATE HFA 108 (90 BASE) MCG/ACT IN AERS
2.0000 | INHALATION_SPRAY | Freq: Four times a day (QID) | RESPIRATORY_TRACT | 5 refills | Status: DC
  Filled 2020-12-03: qty 18, 25d supply, fill #0
  Filled 2021-01-21: qty 18, 25d supply, fill #1
  Filled 2021-02-14: qty 18, 25d supply, fill #2
  Filled 2021-03-18: qty 18, 25d supply, fill #3
  Filled 2021-04-08: qty 18, 25d supply, fill #4
  Filled 2021-05-06: qty 18, 25d supply, fill #5

## 2020-12-04 ENCOUNTER — Other Ambulatory Visit: Payer: Self-pay

## 2020-12-20 ENCOUNTER — Other Ambulatory Visit: Payer: Self-pay

## 2020-12-20 MED ORDER — BUDESONIDE-FORMOTEROL FUMARATE 160-4.5 MCG/ACT IN AERO
INHALATION_SPRAY | RESPIRATORY_TRACT | 5 refills | Status: DC
  Filled 2020-12-20: qty 10.2, 30d supply, fill #0
  Filled 2021-02-20 – 2021-03-12 (×2): qty 10.2, 30d supply, fill #1
  Filled 2021-05-10: qty 10.2, 30d supply, fill #2
  Filled 2021-06-21: qty 10.2, 30d supply, fill #3
  Filled 2021-08-26 – 2021-10-16 (×2): qty 10.2, 30d supply, fill #4
  Filled 2021-11-29: qty 10.2, 30d supply, fill #5

## 2020-12-20 MED FILL — Albuterol Sulfate Inhal Aero 108 MCG/ACT (90MCG Base Equiv): RESPIRATORY_TRACT | 16 days supply | Qty: 18 | Fill #0 | Status: AC

## 2020-12-21 ENCOUNTER — Other Ambulatory Visit: Payer: Self-pay

## 2020-12-21 DIAGNOSIS — K219 Gastro-esophageal reflux disease without esophagitis: Secondary | ICD-10-CM | POA: Diagnosis not present

## 2020-12-21 DIAGNOSIS — J4540 Moderate persistent asthma, uncomplicated: Secondary | ICD-10-CM | POA: Diagnosis not present

## 2020-12-21 MED ORDER — BUDESONIDE-FORMOTEROL FUMARATE 160-4.5 MCG/ACT IN AERO: INHALATION_SPRAY | RESPIRATORY_TRACT | 5 refills | Status: DC

## 2020-12-21 MED ORDER — ALBUTEROL SULFATE HFA 108 (90 BASE) MCG/ACT IN AERS
INHALATION_SPRAY | RESPIRATORY_TRACT | 5 refills | Status: DC
  Filled 2020-12-21: qty 18, 30d supply, fill #0

## 2020-12-21 MED ORDER — PREDNISONE 10 MG PO TABS
ORAL_TABLET | ORAL | 0 refills | Status: DC
  Filled 2020-12-21: qty 20, 8d supply, fill #0

## 2020-12-24 ENCOUNTER — Other Ambulatory Visit: Payer: Self-pay

## 2021-01-19 ENCOUNTER — Emergency Department
Admission: EM | Admit: 2021-01-19 | Discharge: 2021-01-19 | Disposition: A | Discharge status: Home / Self Care | Payer: 59 | Attending: Emergency Medicine | Admitting: Emergency Medicine

## 2021-01-19 ENCOUNTER — Emergency Department: Payer: 59

## 2021-01-19 ENCOUNTER — Other Ambulatory Visit: Payer: Self-pay

## 2021-01-19 ENCOUNTER — Encounter: Payer: Self-pay | Admitting: Emergency Medicine

## 2021-01-19 DIAGNOSIS — R1013 Epigastric pain: Secondary | ICD-10-CM | POA: Insufficient documentation

## 2021-01-19 DIAGNOSIS — Z87891 Personal history of nicotine dependence: Secondary | ICD-10-CM | POA: Insufficient documentation

## 2021-01-19 DIAGNOSIS — K760 Fatty (change of) liver, not elsewhere classified: Secondary | ICD-10-CM | POA: Diagnosis not present

## 2021-01-19 DIAGNOSIS — J45909 Unspecified asthma, uncomplicated: Secondary | ICD-10-CM | POA: Diagnosis not present

## 2021-01-19 DIAGNOSIS — K2970 Gastritis, unspecified, without bleeding: Secondary | ICD-10-CM | POA: Diagnosis not present

## 2021-01-19 DIAGNOSIS — R109 Unspecified abdominal pain: Secondary | ICD-10-CM | POA: Diagnosis not present

## 2021-01-19 LAB — URINALYSIS, COMPLETE (UACMP) WITH MICROSCOPIC
Bilirubin Urine: NEGATIVE
Glucose, UA: NEGATIVE mg/dL
Hgb urine dipstick: NEGATIVE
Ketones, ur: NEGATIVE mg/dL
Leukocytes,Ua: NEGATIVE
Nitrite: NEGATIVE
Protein, ur: NEGATIVE mg/dL
Specific Gravity, Urine: 1.008 (ref 1.005–1.030)
Squamous Epithelial / HPF: NONE SEEN (ref 0–5)
WBC, UA: NONE SEEN WBC/hpf (ref 0–5)
pH: 7 (ref 5.0–8.0)

## 2021-01-19 LAB — CBC
HCT: 47.1 % (ref 39.0–52.0)
Hemoglobin: 16.5 g/dL (ref 13.0–17.0)
MCH: 30.4 pg (ref 26.0–34.0)
MCHC: 35 g/dL (ref 30.0–36.0)
MCV: 86.7 fL (ref 80.0–100.0)
Platelets: 295 10*3/uL (ref 150–400)
RBC: 5.43 MIL/uL (ref 4.22–5.81)
RDW: 13.2 % (ref 11.5–15.5)
WBC: 7.3 10*3/uL (ref 4.0–10.5)
nRBC: 0 % (ref 0.0–0.2)

## 2021-01-19 LAB — LIPASE, BLOOD: Lipase: 26 U/L (ref 11–51)

## 2021-01-19 LAB — COMPREHENSIVE METABOLIC PANEL
ALT: 43 U/L (ref 0–44)
AST: 25 U/L (ref 15–41)
Albumin: 4.2 g/dL (ref 3.5–5.0)
Alkaline Phosphatase: 70 U/L (ref 38–126)
Anion gap: 6 (ref 5–15)
BUN: 5 mg/dL — ABNORMAL LOW (ref 6–20)
CO2: 29 mmol/L (ref 22–32)
Calcium: 9.4 mg/dL (ref 8.9–10.3)
Chloride: 104 mmol/L (ref 98–111)
Creatinine, Ser: 1.18 mg/dL (ref 0.61–1.24)
GFR, Estimated: >60 mL/min (ref >60)
Glucose, Bld: 99 mg/dL (ref 70–99)
Potassium: 4.7 mmol/L (ref 3.5–5.1)
Sodium: 139 mmol/L (ref 135–145)
Total Bilirubin: 1.1 mg/dL (ref 0.3–1.2)
Total Protein: 7.7 g/dL (ref 6.5–8.1)

## 2021-01-19 MED ORDER — FAMOTIDINE 20 MG PO TABS
20.0000 mg | ORAL_TABLET | Freq: Once | ORAL | Status: AC
  Administered 2021-01-19: 20 mg via ORAL
  Filled 2021-01-19: qty 1

## 2021-01-19 MED ORDER — ALUM & MAG HYDROXIDE-SIMETH 200-200-20 MG/5ML PO SUSP
15.0000 mL | Freq: Once | ORAL | Status: AC
  Administered 2021-01-19: 15 mL via ORAL
  Filled 2021-01-19: qty 30

## 2021-01-19 NOTE — Discharge Instructions (Addendum)
We suspect that you likely have gastritis or inflammation of the stomach lining.  Continue to take your omeprazole.  Avoid any spicy foods, alcohol, coffee, or very fatty or fried foods.  Your ultrasound shows likely polyps in the gallbladder although there may be small gallstones.  This does not need any treatment at this time.  Your ultrasound also shows a lesion in your liver.  You should follow-up with your primary care doctor.  This will require a test called a "multiphasic contrast enhanced MRI" sometime in the next 1 to 2 months.  Return to the ER for new, worsening, or persistent severe pain, vomiting, fever, weakness, or any other new or worsening symptoms that concern you.

## 2021-01-19 NOTE — ED Triage Notes (Signed)
Pt in via POV, reports epigastric pain since last night, taking some Pepto this am, having diarrhea since.  Denies N/V.  Ambulatory to triage, NAD noted at this time.

## 2021-01-19 NOTE — ED Provider Notes (Signed)
[facility] Emergency Department Provider Note ____________________________________________   Event Date/Time   First MD Initiated Contact with Patient 01/19/21 1005     (approximate)  I have reviewed the triage vital signs and the nursing notes.   HISTORY  Chief Complaint Abdominal Pain    HPI Spencer Tanner is a 45 y.o. male with history of asthma and GERD who presents with epigastric abdominal pain, cute onset about 12 hours ago, persistent course since then, coming in waves.  The patient took Pepto-Bismol this morning and started to have some loose stools.  The pain has improved but not resolved.  He denies any nausea or vomiting and has no fever or chills.  He states that he ate a hotdog for lunch yesterday, oatmeal in the evening, and did not drink alcohol.  Past Medical History:  Diagnosis Date  . Asthma     There are no problems to display for this patient.   History reviewed. No pertinent surgical history.  Prior to Admission medications   Medication Sig Start Date End Date Taking? Authorizing Provider  albuterol (PROVENTIL HFA;VENTOLIN HFA) 108 (90 Base) MCG/ACT inhaler Inhale 2 puffs into the lungs every 6 (six) hours as needed. 07/30/16   [provider]  albuterol (VENTOLIN HFA) 108 (90 Base) MCG/ACT inhaler USE AS DIRECTED AS NEEDED FOR SHORTNESS OF BREATH 04/20/20 04/20/21  Hande, Roderic Palau, MD  albuterol (VENTOLIN HFA) 108 (90 Base) MCG/ACT inhaler INHALE 2 PUFFS BY MOUTH EVERY 6 HOURS AS NEEDED FOR WHEEZING 12/03/20     albuterol (VENTOLIN HFA) 108 (90 Base) MCG/ACT inhaler Inhale 2 inhalations into the lungs every 6 (six) hours as needed 12/21/20     budesonide-formoterol (SYMBICORT) 160-4.5 MCG/ACT inhaler INHALE 2 PUFFS BY MOUTH 2 TIMES DAILY 11/22/19 11/21/20  Barbette Reichmann, MD  budesonide-formoterol (SYMBICORT) 160-4.5 MCG/ACT inhaler INHALE 2 PUFFS BY MOUTH 2 TIMES DAILY 12/20/20 12/20/21    budesonide-formoterol  (SYMBICORT) 160-4.5 MCG/ACT inhaler Inhale into the lungs. 12/21/20     chlorhexidine (PERIDEX) 0.12 % solution RINSE WITH 1/2 OZ TWICE A DAY (MORNING AND EVENING) AFTER BRUSHING 10/10/20 10/10/21    clindamycin (CLEOCIN) 300 MG capsule TAKE 1 CAPSULE (300 MG TOTAL) BY MOUTH 4 (FOUR) TIMES DAILY FOR 14 DAYS 07/24/20 07/24/21  Ignacia Bayley, PA-C  clindamycin (CLEOCIN) 300 MG capsule Take 1 capsule by mouth 3 times daily for 10 days 11/20/20     gabapentin (NEURONTIN) 100 MG capsule TAKE 1 CAPSULE BY MOUTH 3 TIMES DAILY 05/28/20 05/28/21  Barbette Reichmann, MD  ibuprofen (ADVIL) 800 MG tablet Take 1 tablet by mouth 2 times daily as needed for Pain 11/20/20     methocarbamol (ROBAXIN) 500 MG tablet Take 1 tablet (500 mg total) by mouth 3 (three) times daily. 06/05/19   Evon Slack, PA-C  omeprazole (PRILOSEC) 20 MG capsule Take by mouth. 06/04/17 06/04/18  [provider]  omeprazole (PRILOSEC) 20 MG capsule TAKE 1 CAPSULE BY MOUTH TWICE DAILY 05/28/20 05/28/21  Barbette Reichmann, MD  predniSONE (DELTASONE) 10 MG tablet 10 day taper. 5,5,4,4,3,3,2,2,1,1 06/05/19   Evon Slack, PA-C  predniSONE (DELTASONE) 10 MG tablet Take 4 tablets daily x 2 days, then 3 tablets daily x 2 days, then 2 tablets daily x 2 days, then 1 tablet daily x 2 days. 12/21/20     SYMBICORT 160-4.5 MCG/ACT inhaler Inhale 2 puffs into the lungs 2 (two) times daily. 08/21/16   [provider]  tobramycin (TOBREX) 0.3 % ophthalmic solution Place 2 drops into  the left eye every 4 (four) hours. 06/28/18   Tommi Rumps, PA-C    Allergies Amoxicillin and Penicillins  No family history on file.  Social History Social History   Tobacco Use  . Smoking status: Former Games developer  . Smokeless tobacco: Never Used  Vaping Use  . Vaping Use: Never used  Substance Use Topics  . Alcohol use: No    Alcohol/week: 0.0 standard drinks  . Drug use: No    Review of Systems  Constitutional: No fever/chills. Eyes: No  visual changes. ENT: No sore throat. Cardiovascular: Denies chest pain. Respiratory: Denies shortness of breath. Gastrointestinal: No nausea or vomiting.  Positive for diarrhea.  Genitourinary: Negative for dysuria.  Musculoskeletal: Negative for back pain. Skin: Negative for rash. Neurological: Negative for headache.   ____________________________________________   PHYSICAL EXAM:  VITAL SIGNS: ED Triage Vitals  Enc Vitals Group     BP 01/19/21 0843 (!) 145/99     Pulse Rate 01/19/21 0843 69     Resp 01/19/21 0843 16     Temp 01/19/21 0843 98.3 F (36.8 C)     Temp Source 01/19/21 0843 Oral     SpO2 01/19/21 0843 97 %     Weight 01/19/21 0844 198 lb (89.8 kg)     Height 01/19/21 0844 6' (1.829 m)     Head Circumference --      Peak Flow --      Pain Score 01/19/21 0843 3     Pain Loc --      Pain Edu? --      Excl. in GC? --     Constitutional: Alert and oriented. Well appearing and in no acute distress. Eyes: Conjunctivae are normal.  Head: Atraumatic. Nose: No congestion/rhinnorhea. Mouth/Throat: Mucous membranes are moist.   Neck: Normal range of motion.  Cardiovascular: Normal rate, regular rhythm. Grossly normal heart sounds.  Good peripheral circulation. Respiratory: Normal respiratory effort.  No retractions. Lungs CTAB. Gastrointestinal: Soft with mild epigastric discomfort but no focal tenderness.  No distention.  Genitourinary: No CVA tenderness. Musculoskeletal: No lower extremity edema.  Extremities warm and well perfused.  Neurologic:  Normal speech and language. No gross focal neurologic deficits are appreciated.  Skin:  Skin is warm and dry. No rash noted. Psychiatric: Mood and affect are normal. Speech and behavior are normal.  ____________________________________________   LABS (all labs ordered are listed, but only abnormal results are displayed)  Labs Reviewed  COMPREHENSIVE METABOLIC PANEL - Abnormal; Notable for the following components:       Result Value   BUN 5 (*)    All other components within normal limits  URINALYSIS, COMPLETE (UACMP) WITH MICROSCOPIC - Abnormal; Notable for the following components:   Color, Urine YELLOW (*)    APPearance CLEAR (*)    Bacteria, UA RARE (*)    All other components within normal limits  LIPASE, BLOOD  CBC   ____________________________________________  EKG  ED ECG REPORT I, Dionne Bucy, the attending physician, personally viewed and interpreted this ECG.  Date: 01/19/2021 EKG Time: 0858 Rate: 72 Rhythm: normal sinus rhythm QRS Axis: normal Intervals: normal ST/T Wave abnormalities: normal Narrative Interpretation: no evidence of acute ischemia  ____________________________________________  RADIOLOGY  US abdomen RUQ: Multiple gallbladder polyps versus small adherent stones.  Liver lesion, likely benign.  ____________________________________________   PROCEDURES  Procedure(s) performed: No  Procedures  Critical Care performed: No ____________________________________________   INITIAL IMPRESSION / ASSESSMENT AND PLAN / ED COURSE  Pertinent labs &  imaging results that were available during my care of the patient were reviewed by me and considered in my medical decision making (see chart for details).  45 year old male with PMH as noted above presents with epigastric abdominal pain since last night associated with some loose stools today after taking Pepto-Bismol.  On exam the patient is overall well-appearing.  His vital signs are normal.  The abdomen is soft with mild epigastric discomfort but no focal tenderness or peritoneal signs.  Differential includes gastritis, PUD, biliary colic, acute cholecystitis, pancreatitis, other hepatobiliary cause, gastroenteritis.  We will obtain a right upper quadrant ultrasound, labs, give Pepcid and Maalox and reassess.  ----------------------------------------- 12:39 PM on  01/19/2021 -----------------------------------------  Patient is feeling better.  Ultrasound shows gallbladder polyps versus small adherent stones and a liver lesion which requires nonemergent follow-up.  I have counseled the patient on the results of the work-up and follow-up recommendations.  He agrees to follow-up with his primary care doctor.  Return precautions given, and he expresses understanding.  ____________________________________________   FINAL CLINICAL IMPRESSION(S) / ED DIAGNOSES  Final diagnoses:  Epigastric abdominal pain      NEW MEDICATIONS STARTED DURING THIS VISIT:  New Prescriptions   No medications on file     Note:  This document was prepared using Dragon voice recognition software and may include unintentional dictation errors.    Dionne Bucy, MD 01/19/21 1240

## 2021-01-21 ENCOUNTER — Other Ambulatory Visit: Payer: Self-pay | Admitting: Internal Medicine

## 2021-01-21 ENCOUNTER — Other Ambulatory Visit (HOSPITAL_COMMUNITY): Payer: Self-pay | Admitting: Internal Medicine

## 2021-01-21 ENCOUNTER — Other Ambulatory Visit: Payer: Self-pay

## 2021-01-21 DIAGNOSIS — K219 Gastro-esophageal reflux disease without esophagitis: Secondary | ICD-10-CM | POA: Diagnosis not present

## 2021-01-21 DIAGNOSIS — K769 Liver disease, unspecified: Secondary | ICD-10-CM | POA: Diagnosis not present

## 2021-01-21 DIAGNOSIS — R1010 Upper abdominal pain, unspecified: Secondary | ICD-10-CM

## 2021-01-21 DIAGNOSIS — K7689 Other specified diseases of liver: Secondary | ICD-10-CM

## 2021-01-21 DIAGNOSIS — K824 Cholesterolosis of gallbladder: Secondary | ICD-10-CM | POA: Diagnosis not present

## 2021-01-21 MED ORDER — CLINDAMYCIN HCL 300 MG PO CAPS
ORAL_CAPSULE | ORAL | 0 refills | Status: DC
  Filled 2021-01-21: qty 30, 10d supply, fill #0

## 2021-01-21 MED ORDER — OMEPRAZOLE 20 MG PO CPDR
DELAYED_RELEASE_CAPSULE | ORAL | 5 refills | Status: DC
  Filled 2021-01-21: qty 60, 30d supply, fill #0

## 2021-01-31 ENCOUNTER — Ambulatory Visit: Payer: 59

## 2021-02-10 ENCOUNTER — Ambulatory Visit
Admission: RE | Admit: 2021-02-10 | Discharge: 2021-02-10 | Disposition: A | Discharge status: Home / Self Care | Payer: 59 | Source: Ambulatory Visit | Attending: Internal Medicine | Admitting: Internal Medicine

## 2021-02-10 DIAGNOSIS — K7689 Other specified diseases of liver: Secondary | ICD-10-CM | POA: Insufficient documentation

## 2021-02-10 DIAGNOSIS — R1010 Upper abdominal pain, unspecified: Secondary | ICD-10-CM | POA: Diagnosis not present

## 2021-02-10 DIAGNOSIS — Z0389 Encounter for observation for other suspected diseases and conditions ruled out: Secondary | ICD-10-CM | POA: Diagnosis not present

## 2021-02-10 MED ORDER — GADOBUTROL 1 MMOL/ML IV SOLN
7.5000 mL | Freq: Once | INTRAVENOUS | Status: AC | PRN
  Administered 2021-02-10: 7.5 mL via INTRAVENOUS

## 2021-02-14 ENCOUNTER — Other Ambulatory Visit: Payer: Self-pay

## 2021-02-20 ENCOUNTER — Other Ambulatory Visit: Payer: Self-pay

## 2021-03-04 ENCOUNTER — Other Ambulatory Visit: Payer: Self-pay

## 2021-03-12 ENCOUNTER — Other Ambulatory Visit: Payer: Self-pay

## 2021-03-18 ENCOUNTER — Other Ambulatory Visit: Payer: Self-pay

## 2021-04-08 ENCOUNTER — Other Ambulatory Visit: Payer: Self-pay

## 2021-05-06 ENCOUNTER — Other Ambulatory Visit: Payer: Self-pay

## 2021-05-10 ENCOUNTER — Other Ambulatory Visit: Payer: Self-pay

## 2021-05-14 ENCOUNTER — Emergency Department
Admission: EM | Admit: 2021-05-14 | Discharge: 2021-05-14 | Disposition: A | Discharge status: Home / Self Care | Payer: 59 | Attending: Emergency Medicine | Admitting: Emergency Medicine

## 2021-05-14 ENCOUNTER — Encounter: Payer: Self-pay | Admitting: Emergency Medicine

## 2021-05-14 ENCOUNTER — Other Ambulatory Visit: Payer: Self-pay

## 2021-05-14 DIAGNOSIS — Z7951 Long term (current) use of inhaled steroids: Secondary | ICD-10-CM | POA: Diagnosis not present

## 2021-05-14 DIAGNOSIS — R062 Wheezing: Secondary | ICD-10-CM

## 2021-05-14 DIAGNOSIS — J4521 Mild intermittent asthma with (acute) exacerbation: Secondary | ICD-10-CM | POA: Insufficient documentation

## 2021-05-14 DIAGNOSIS — Z87891 Personal history of nicotine dependence: Secondary | ICD-10-CM | POA: Diagnosis not present

## 2021-05-14 MED ORDER — DEXAMETHASONE SODIUM PHOSPHATE 10 MG/ML IJ SOLN: 10.0000 mg | Freq: Once | INTRAMUSCULAR | Status: DC

## 2021-05-14 MED ORDER — METHYLPREDNISOLONE SODIUM SUCC 125 MG IJ SOLR
80.0000 mg | Freq: Once | INTRAMUSCULAR | Status: AC
  Administered 2021-05-14: 80 mg via INTRAMUSCULAR
  Filled 2021-05-14: qty 2

## 2021-05-14 MED ORDER — IPRATROPIUM-ALBUTEROL 0.5-2.5 (3) MG/3ML IN SOLN: 3.0000 mL | Freq: Once | RESPIRATORY_TRACT | Status: DC

## 2021-05-14 NOTE — Discharge Instructions (Addendum)
Please continue with albuterol inhaler 2 puffs every 6 hours as needed.  Please pick up your Symbicort prescription tomorrow.  Return to the ER for any shortness of breath, wheezing, worsening symptoms or urgent changes in your health.

## 2021-05-14 NOTE — ED Triage Notes (Signed)
Pt reports that he is out of his symbicort but even with insurances it cost $75 and he cant afford to get until he get paid in two days. He has been using is albuterol but it does not help as much as the Symbicort. Pt is able to speak in complete sentences.

## 2021-05-14 NOTE — ED Provider Notes (Signed)
Emergency Medicine Provider Triage Evaluation Note  Spencer Tanner , a 45 y.o. male  was evaluated in triage.  Pt complains of out of Symbicort, cannot get for 2 days.  Increased wheezing.  Albuterol takes the edge off but still having trouble breathing.  Review of Systems  Positive: Shortness of breath and wheezing Negative: Fever, chills, vomiting or diarrhea  Physical Exam  BP (!) 173/104   Pulse 81   Temp 98.4 F (36.9 C) (Oral)   Resp 17   Ht 6\' 1"  (1.854 m)   Wt 91.2 kg   SpO2 99%   BMI 26.52 kg/m  Gen:   Awake, no distress   Resp:  Normal effort, diffuse wheezing MSK:   Moves extremities without difficulty  Other:    Medical Decision Making  Medically screening exam initiated at 1:24 PM.  Appropriate orders placed.  Spencer Tanner was informed that the remainder of the evaluation will be completed by another provider, this initial triage assessment does not replace that evaluation, and the importance of remaining in the ED until their evaluation is complete.     Spencer Sensor, PA-C 05/14/21 1324    05/16/21, MD 05/14/21 1537

## 2021-05-14 NOTE — ED Notes (Signed)
D/C and reasons to return to ED discussed with pt, pt verbalized understanding. NAD noted on D/C.  

## 2021-05-14 NOTE — ED Provider Notes (Signed)
[facility] EMERGENCY DEPARTMENT Provider Note   CSN: [id number] Arrival date & time: 05/14/21  1230     History Chief Complaint  Patient presents with   Wheezing    Spencer Tanner is a 45 y.o. male with a history of asthma presents to the emergency department for evaluation wheezing.  Patient requesting steroid.  Patient states he is getting some mild relief with his albuterol 2 puffs every 6 hours.  He is prescribed Symbicort but has not picked this up yet, will not get paid until tomorrow and plans on picking this up tomorrow.  He denies any fevers, chills, cough, shortness of breath.  His wheezing is mild.    HPI     Past Medical History:  Diagnosis Date   Asthma     There are no problems to display for this patient.   No past surgical history on file.     No family history on file.  Social History   Tobacco Use   Smoking status: Former   Smokeless tobacco: Never  Building services engineer Use: Never used  Substance Use Topics   Alcohol use: No    Alcohol/week: 0.0 standard drinks   Drug use: No    Home Medications Prior to Admission medications   Medication Sig Start Date End Date Taking? Authorizing Provider  albuterol (PROVENTIL HFA;VENTOLIN HFA) 108 (90 Base) MCG/ACT inhaler Inhale 2 puffs into the lungs every 6 (six) hours as needed. 07/30/16   [provider]  albuterol (VENTOLIN HFA) 108 (90 Base) MCG/ACT inhaler USE AS DIRECTED AS NEEDED FOR SHORTNESS OF BREATH 04/20/20 04/20/21  Hande, Roderic Palau, MD  albuterol (VENTOLIN HFA) 108 (90 Base) MCG/ACT inhaler INHALE 2 PUFFS BY MOUTH EVERY 6 HOURS AS NEEDED FOR WHEEZING 12/03/20     albuterol (VENTOLIN HFA) 108 (90 Base) MCG/ACT inhaler Inhale 2 inhalations into the lungs every 6 (six) hours as needed 12/21/20     budesonide-formoterol (SYMBICORT) 160-4.5 MCG/ACT inhaler INHALE 2 PUFFS BY MOUTH 2 TIMES DAILY 11/22/19 11/21/20  Barbette Reichmann, MD  budesonide-formoterol (SYMBICORT)  160-4.5 MCG/ACT inhaler INHALE 2 PUFFS BY MOUTH 2 TIMES DAILY 12/20/20 12/20/21    budesonide-formoterol (SYMBICORT) 160-4.5 MCG/ACT inhaler Inhale into the lungs. 12/21/20     chlorhexidine (PERIDEX) 0.12 % solution RINSE WITH 1/2 OZ TWICE A DAY (MORNING AND EVENING) AFTER BRUSHING 10/10/20 10/10/21    clindamycin (CLEOCIN) 300 MG capsule TAKE 1 CAPSULE (300 MG TOTAL) BY MOUTH 4 (FOUR) TIMES DAILY FOR 14 DAYS 07/24/20 07/24/21  Ignacia Bayley, PA-C  clindamycin (CLEOCIN) 300 MG capsule Take 1 capsule (300 mg total) by mouth 3 (three) times daily for 10 days 01/21/21     gabapentin (NEURONTIN) 100 MG capsule TAKE 1 CAPSULE BY MOUTH 3 TIMES DAILY 05/28/20 05/28/21  Barbette Reichmann, MD  ibuprofen (ADVIL) 800 MG tablet Take 1 tablet by mouth 2 times daily as needed for Pain 11/20/20     methocarbamol (ROBAXIN) 500 MG tablet Take 1 tablet (500 mg total) by mouth 3 (three) times daily. 06/05/19   Evon Slack, PA-C  omeprazole (PRILOSEC) 20 MG capsule Take by mouth. 06/04/17 06/04/18  [provider]  omeprazole (PRILOSEC) 20 MG capsule TAKE 1 CAPSULE BY MOUTH TWICE DAILY 05/28/20 05/28/21  Barbette Reichmann, MD  omeprazole (PRILOSEC) 20 MG capsule Take 1 capsule (20 mg total) by mouth 2 (two) times daily 01/21/21     predniSONE (DELTASONE) 10 MG tablet 10 day taper. 5,5,4,4,3,3,2,2,1,1 06/05/19   Evon Slack, PA-C  predniSONE (DELTASONE) 10 MG tablet Take 4 tablets daily x 2 days, then 3 tablets daily x 2 days, then 2 tablets daily x 2 days, then 1 tablet daily x 2 days. 12/21/20     SYMBICORT 160-4.5 MCG/ACT inhaler Inhale 2 puffs into the lungs 2 (two) times daily. 08/21/16   [provider]  tobramycin (TOBREX) 0.3 % ophthalmic solution Place 2 drops into the left eye every 4 (four) hours. 06/28/18   Tommi Rumps, PA-C    Allergies    Amoxicillin and Penicillins  Review of Systems   Review of Systems  Constitutional:  Negative for chills and fever.  Respiratory:  Positive for  wheezing. Negative for cough, choking, chest tightness and shortness of breath.   Cardiovascular:  Negative for chest pain.  Gastrointestinal:  Negative for diarrhea, nausea and vomiting.  Musculoskeletal:  Negative for arthralgias, back pain and myalgias.  Skin:  Negative for rash.   Physical Exam Updated Vital Signs BP (!) 173/104   Pulse 81   Temp 98.4 F (36.9 C) (Oral)   Resp 17   Ht 6\' 1"  (1.854 m)   Wt 91.2 kg   SpO2 99%   BMI 26.52 kg/m   Physical Exam Constitutional:      Appearance: He is well-developed.  HENT:     Head: Normocephalic and atraumatic.     Nose: Nose normal.  Eyes:     Conjunctiva/sclera: Conjunctivae normal.  Cardiovascular:     Rate and Rhythm: Normal rate.  Pulmonary:     Effort: Pulmonary effort is normal. No respiratory distress.     Breath sounds: No stridor. Wheezing present. No rhonchi.  Abdominal:     General: Abdomen is flat. There is no distension.     Tenderness: There is no abdominal tenderness. There is no guarding.  Musculoskeletal:        General: Normal range of motion.     Cervical back: Normal range of motion.  Skin:    General: Skin is warm.     Findings: No rash.  Neurological:     Mental Status: He is alert and oriented to person, place, and time.  Psychiatric:        Behavior: Behavior normal.        Thought Content: Thought content normal.    ED Results / Procedures / Treatments   Labs (all labs ordered are listed, but only abnormal results are displayed) Labs Reviewed - No data to display  EKG None  Radiology No results found.  Procedures Procedures   Medications Ordered in ED Medications  ipratropium-albuterol (DUONEB) 0.5-2.5 (3) MG/3ML nebulizer solution 3 mL (has no administration in time range)  dexamethasone (DECADRON) injection 10 mg (has no administration in time range)  methylPREDNISolone sodium succinate (SOLU-MEDROL) 125 mg/2 mL injection 80 mg (has no administration in time range)    ED  Course  I have reviewed the triage vital signs and the nursing notes.  Pertinent labs & imaging results that were available during my care of the patient were reviewed by me and considered in my medical decision making (see chart for details).    MDM Rules/Calculators/A&P                         45 year old male with wheezing.  No chest pain or shortness of breath.  Vital signs are stable with O2 sats 99% with normal respirations.  Heart rate normal.  His wheezes are mild.  He is  using albuterol with some mild relief but is not completely resolving the wheezing.  Patient is scheduled to get his Symbicort tomorrow after getting paid.  We will give intramuscular dose of Solu-Medrol.  Patient will continue with albuterol.  Offered DuoNeb treatment but he is not interested in this at this time.  Patient stable and ready for discharge to home he understands signs symptoms return to the ER for. Final Clinical Impression(s) / ED Diagnoses Final diagnoses:  Mild intermittent asthma with exacerbation  Wheezing    Rx / DC Orders ED Discharge Orders     None        Ronnette Juniper 05/14/21 1649    Merwyn Katos, MD 05/14/21 2236

## 2021-05-15 ENCOUNTER — Other Ambulatory Visit: Payer: Self-pay

## 2021-05-16 ENCOUNTER — Other Ambulatory Visit: Payer: Self-pay

## 2021-05-18 ENCOUNTER — Emergency Department
Admission: EM | Admit: 2021-05-18 | Discharge: 2021-05-18 | Disposition: A | Discharge status: Home / Self Care | Payer: 59 | Attending: Emergency Medicine | Admitting: Emergency Medicine

## 2021-05-18 ENCOUNTER — Other Ambulatory Visit: Payer: Self-pay

## 2021-05-18 DIAGNOSIS — Z87891 Personal history of nicotine dependence: Secondary | ICD-10-CM | POA: Diagnosis not present

## 2021-05-18 DIAGNOSIS — R109 Unspecified abdominal pain: Secondary | ICD-10-CM | POA: Insufficient documentation

## 2021-05-18 DIAGNOSIS — Z7951 Long term (current) use of inhaled steroids: Secondary | ICD-10-CM | POA: Insufficient documentation

## 2021-05-18 DIAGNOSIS — J45909 Unspecified asthma, uncomplicated: Secondary | ICD-10-CM | POA: Diagnosis not present

## 2021-05-18 LAB — CBC WITH DIFFERENTIAL/PLATELET
Abs Immature Granulocytes: 0.02 10*3/uL (ref 0.00–0.07)
Basophils Absolute: 0 10*3/uL (ref 0.0–0.1)
Basophils Relative: 0 %
Eosinophils Absolute: 0.1 10*3/uL (ref 0.0–0.5)
Eosinophils Relative: 2 %
HCT: 45.8 % (ref 39.0–52.0)
Hemoglobin: 16.5 g/dL (ref 13.0–17.0)
Immature Granulocytes: 0 %
Lymphocytes Relative: 22 %
Lymphs Abs: 2 10*3/uL (ref 0.7–4.0)
MCH: 31.1 pg (ref 26.0–34.0)
MCHC: 36 g/dL (ref 30.0–36.0)
MCV: 86.4 fL (ref 80.0–100.0)
Monocytes Absolute: 0.6 10*3/uL (ref 0.1–1.0)
Monocytes Relative: 6 %
Neutro Abs: 6.2 10*3/uL (ref 1.7–7.7)
Neutrophils Relative %: 70 %
Platelets: 307 10*3/uL (ref 150–400)
RBC: 5.3 MIL/uL (ref 4.22–5.81)
RDW: 13.1 % (ref 11.5–15.5)
WBC: 8.9 10*3/uL (ref 4.0–10.5)
nRBC: 0 % (ref 0.0–0.2)

## 2021-05-18 LAB — BASIC METABOLIC PANEL
Anion gap: 9 (ref 5–15)
BUN: 12 mg/dL (ref 6–20)
CO2: 26 mmol/L (ref 22–32)
Calcium: 9.1 mg/dL (ref 8.9–10.3)
Chloride: 103 mmol/L (ref 98–111)
Creatinine, Ser: 1.11 mg/dL (ref 0.61–1.24)
GFR, Estimated: >60 mL/min (ref >60)
Glucose, Bld: 104 mg/dL — ABNORMAL HIGH (ref 70–99)
Potassium: 3.2 mmol/L — ABNORMAL LOW (ref 3.5–5.1)
Sodium: 138 mmol/L (ref 135–145)

## 2021-05-18 LAB — TSH: TSH: 2.153 u[IU]/mL (ref 0.350–4.500)

## 2021-05-18 NOTE — ED Provider Notes (Signed)
[facility] Emergency Department Provider Note  ____________________________________________  Time seen: Approximately 7:35 PM  I have reviewed the triage vital signs and the nursing notes.   HISTORY  Chief Complaint Dizziness    HPI Spencer Tanner is a 45 y.o. male with a past medical history of asthma who comes ED complaining of right flank pain that came on suddenly today, causing him to stumble and lowered himself to the ground.  Denies loss of consciousness or significant trauma or injury.  The pain only lasted for about a second and that was gone, and then he picked himself back up and continued on his day.  No radiation.  No aggravating or alleviating factors.  No chest pain or shortness of breath or fever.  No vomiting or diarrhea.  Eating and drinking normally.   Past Medical History:  Diagnosis Date   Asthma      There are no problems to display for this patient.    History reviewed. No pertinent surgical history.   Prior to Admission medications   Medication Sig Start Date End Date Taking? Authorizing Provider  albuterol (PROVENTIL HFA;VENTOLIN HFA) 108 (90 Base) MCG/ACT inhaler Inhale 2 puffs into the lungs every 6 (six) hours as needed. 07/30/16   [provider]  albuterol (VENTOLIN HFA) 108 (90 Base) MCG/ACT inhaler USE AS DIRECTED AS NEEDED FOR SHORTNESS OF BREATH 04/20/20 04/20/21  Hande, Roderic Palau, MD  albuterol (VENTOLIN HFA) 108 (90 Base) MCG/ACT inhaler INHALE 2 PUFFS BY MOUTH EVERY 6 HOURS AS NEEDED FOR WHEEZING 12/03/20     albuterol (VENTOLIN HFA) 108 (90 Base) MCG/ACT inhaler Inhale 2 inhalations into the lungs every 6 (six) hours as needed 12/21/20     budesonide-formoterol (SYMBICORT) 160-4.5 MCG/ACT inhaler INHALE 2 PUFFS BY MOUTH 2 TIMES DAILY 11/22/19 11/21/20  Barbette Reichmann, MD  budesonide-formoterol (SYMBICORT) 160-4.5 MCG/ACT inhaler INHALE 2 PUFFS BY MOUTH 2 TIMES DAILY 12/20/20 12/20/21     budesonide-formoterol (SYMBICORT) 160-4.5 MCG/ACT inhaler Inhale into the lungs. 12/21/20     chlorhexidine (PERIDEX) 0.12 % solution RINSE WITH 1/2 OZ TWICE A DAY (MORNING AND EVENING) AFTER BRUSHING 10/10/20 10/10/21    clindamycin (CLEOCIN) 300 MG capsule TAKE 1 CAPSULE (300 MG TOTAL) BY MOUTH 4 (FOUR) TIMES DAILY FOR 14 DAYS 07/24/20 07/24/21  Ignacia Bayley, PA-C  clindamycin (CLEOCIN) 300 MG capsule Take 1 capsule (300 mg total) by mouth 3 (three) times daily for 10 days 01/21/21     gabapentin (NEURONTIN) 100 MG capsule TAKE 1 CAPSULE BY MOUTH 3 TIMES DAILY 05/28/20 05/28/21  Barbette Reichmann, MD  ibuprofen (ADVIL) 800 MG tablet Take 1 tablet by mouth 2 times daily as needed for Pain 11/20/20     methocarbamol (ROBAXIN) 500 MG tablet Take 1 tablet (500 mg total) by mouth 3 (three) times daily. 06/05/19   Evon Slack, PA-C  omeprazole (PRILOSEC) 20 MG capsule Take by mouth. 06/04/17 06/04/18  [provider]  omeprazole (PRILOSEC) 20 MG capsule TAKE 1 CAPSULE BY MOUTH TWICE DAILY 05/28/20 05/28/21  Barbette Reichmann, MD  omeprazole (PRILOSEC) 20 MG capsule Take 1 capsule (20 mg total) by mouth 2 (two) times daily 01/21/21     predniSONE (DELTASONE) 10 MG tablet 10 day taper. 5,5,4,4,3,3,2,2,1,1 06/05/19   Evon Slack, PA-C  predniSONE (DELTASONE) 10 MG tablet Take 4 tablets daily x 2 days, then 3 tablets daily x 2 days, then 2 tablets daily x 2 days, then 1 tablet daily x 2 days. 12/21/20     SYMBICORT  160-4.5 MCG/ACT inhaler Inhale 2 puffs into the lungs 2 (two) times daily. 08/21/16   [provider]  tobramycin (TOBREX) 0.3 % ophthalmic solution Place 2 drops into the left eye every 4 (four) hours. 06/28/18   Tommi Rumps, PA-C     Allergies Amoxicillin and Penicillins   History reviewed. No pertinent family history.  Social History Social History   Tobacco Use   Smoking status: Former   Smokeless tobacco: Never  Building services engineer Use: Never used   Substance Use Topics   Alcohol use: No    Alcohol/week: 0.0 standard drinks   Drug use: No    Review of Systems  Constitutional:   No fever or chills.  ENT:   No sore throat. No rhinorrhea.  Also complains of left eye twitching intermittently Cardiovascular:   No chest pain or syncope. Respiratory:   No dyspnea or cough. Gastrointestinal:   Negative for abdominal pain, vomiting and diarrhea.  Musculoskeletal:   Positive brief fleeting flank pain as above  All other systems reviewed and are negative except as documented above in ROS and HPI.  ____________________________________________   PHYSICAL EXAM:  VITAL SIGNS: ED Triage Vitals [05/18/21 1812]  Enc Vitals Group     BP (!) 160/94     Pulse Rate 87     Resp 18     Temp 99.4 F (37.4 C)     Temp Source Oral     SpO2 97 %     Weight 200 lb 9.9 oz (91 kg)     Height 6\' 1"  (1.854 m)     Head Circumference      Peak Flow      Pain Score 0     Pain Loc      Pain Edu?      Excl. in GC?     Vital signs reviewed, nursing assessments reviewed.   Constitutional:   Alert and oriented. Non-toxic appearance. Eyes:   Conjunctivae are normal. EOMI. PERRL.  No conjunctival injection, no foreign body ENT      Head:   Normocephalic and atraumatic.      Nose:   Wearing a mask.      Mouth/Throat:   Wearing a mask.      Neck:   No meningismus. Full ROM. Hematological/Lymphatic/Immunilogical:   No cervical lymphadenopathy. Cardiovascular:   RRR. Symmetric bilateral radial and DP pulses.  No murmurs. Cap refill less than 2 seconds. Respiratory:   Normal respiratory effort without tachypnea/retractions. Breath sounds are clear and equal bilaterally. No wheezes/rales/rhonchi. Gastrointestinal:   Soft and nontender. Non distended. There is no CVA tenderness.  No rebound, rigidity, or guarding. Genitourinary:   deferred Musculoskeletal:   Normal range of motion in all extremities. No joint effusions.  No lower extremity tenderness.   No edema. Neurologic:   Normal speech and language.  Motor grossly intact. No acute focal neurologic deficits are appreciated.  Skin:    Skin is warm, dry and intact. No rash noted.  No petechiae, purpura, or bullae.  ____________________________________________    LABS (pertinent positives/negatives) (all labs ordered are listed, but only abnormal results are displayed) Labs Reviewed  URINALYSIS, COMPLETE (UACMP) WITH MICROSCOPIC - Abnormal; Notable for the following components:      Result Value   Color, Urine STRAW (*)    APPearance CLEAR (*)    All other components within normal limits  BASIC METABOLIC PANEL - Abnormal; Notable for the following components:   Potassium 3.2 (*)  Glucose, Bld 104 (*)    All other components within normal limits  CBC WITH DIFFERENTIAL/PLATELET  TSH   ____________________________________________   EKG    ____________________________________________    RADIOLOGY  No results found.  ____________________________________________   PROCEDURES Procedures  ____________________________________________    CLINICAL IMPRESSION / ASSESSMENT AND PLAN / ED COURSE  Medications ordered in the ED: Medications - No data to display  Pertinent labs & imaging results that were available during my care of the patient were reviewed by me and considered in my medical decision making (see chart for details).  Spencer Tanner was evaluated in Emergency Department on 05/18/2021 for the symptoms described in the history of present illness. He was evaluated in the context of the global COVID-19 pandemic, which necessitated consideration that the patient might be at risk for infection with the SARS-CoV-2 virus that causes COVID-19. Institutional protocols and algorithms that pertain to the evaluation of patients at risk for COVID-19 are in a state of rapid change based on information released by regulatory bodies including the CDC and federal and  state organizations. These policies and algorithms were followed during the patient's care in the ED.   Patient presents with a very brief episode of right flank pain.  Vital signs exam and labs are normal. Considering the patient's symptoms, medical history, and physical examination today, I have low suspicion for cholecystitis or biliary pathology, pancreatitis, perforation or bowel obstruction, hernia, intra-abdominal abscess, AAA or dissection, volvulus or intussusception, mesenteric ischemia, or appendicitis. Doubt ACS or PE.  Stable for discharge home.      ____________________________________________   FINAL CLINICAL IMPRESSION(S) / ED DIAGNOSES    Final diagnoses:  Right flank pain     ED Discharge Orders     None       Portions of this note were generated with dragon dictation software. Dictation errors may occur despite best attempts at proofreading.    Sharman Cheek, MD 05/18/21 [phone]

## 2021-05-18 NOTE — ED Provider Notes (Signed)
Emergency Medicine Provider Triage Evaluation Note  Spencer Tanner , a 45 y.o. male  was evaluated in triage.  Pt complains of now resolved pain to his right side that caused him a sudden bout of dizziness without LOC.  He did slide into the ground but denies any head injury. He denies any fever, chills, or sweats.  Presents to the ED with symptoms now resolved completely.  Review of Systems  Positive: Left flank pain  Negative: FCS  Physical Exam  BP (!) 160/94   Pulse 87   Temp 99.4 F (37.4 C) (Oral)   Resp 18   Ht 6\' 1"  (1.854 m)   Wt 91 kg   SpO2 97%   BMI 26.47 kg/m  Gen:   Awake, no distress  anxious Resp:  Normal effort CTA MSK:   Moves extremities without difficulty  Other:  CVS: RRR  Medical Decision Making  Medically screening exam initiated at 6:33 PM.  Appropriate orders placed.  Spencer Tanner was informed that the remainder of the evaluation will be completed by another provider, this initial triage assessment does not replace that evaluation, and the importance of remaining in the ED until their evaluation is complete.  Patient went ED evaluation of right flank pain that was spontaneous and fleeting.   Spencer Sensor, PA-C 05/18/21 1921    07/18/21, MD 05/18/21 [phone]

## 2021-05-18 NOTE — ED Triage Notes (Addendum)
Pt comes pov for a sharp side pain in right side where he states that he got dizzy and slid down onto the ground. Immediately got back up and denies LOC. States his eye is also jumping. Pt manic and repetitive in speech. States nothing is wrong or bothering him now.

## 2021-05-19 LAB — URINALYSIS, COMPLETE (UACMP) WITH MICROSCOPIC
Bacteria, UA: NONE SEEN
Bilirubin Urine: NEGATIVE
Glucose, UA: NEGATIVE mg/dL
Hgb urine dipstick: NEGATIVE
Ketones, ur: NEGATIVE mg/dL
Leukocytes,Ua: NEGATIVE
Nitrite: NEGATIVE
Protein, ur: NEGATIVE mg/dL
Specific Gravity, Urine: 1.008 (ref 1.005–1.030)
Squamous Epithelial / HPF: NONE SEEN (ref 0–5)
pH: 7 (ref 5.0–8.0)

## 2021-05-20 ENCOUNTER — Other Ambulatory Visit: Payer: Self-pay

## 2021-05-20 DIAGNOSIS — R0789 Other chest pain: Secondary | ICD-10-CM | POA: Diagnosis not present

## 2021-05-20 DIAGNOSIS — J4520 Mild intermittent asthma, uncomplicated: Secondary | ICD-10-CM | POA: Diagnosis not present

## 2021-05-20 DIAGNOSIS — Z9181 History of falling: Secondary | ICD-10-CM | POA: Diagnosis not present

## 2021-05-20 DIAGNOSIS — K219 Gastro-esophageal reflux disease without esophagitis: Secondary | ICD-10-CM | POA: Diagnosis not present

## 2021-05-20 MED ORDER — BUDESONIDE-FORMOTEROL FUMARATE 160-4.5 MCG/ACT IN AERO
2.0000 | INHALATION_SPRAY | Freq: Two times a day (BID) | RESPIRATORY_TRACT | 5 refills | Status: DC
  Filled 2021-05-20: qty 10.2, 30d supply, fill #0

## 2021-05-20 MED ORDER — OLOPATADINE HCL 0.1 % OP SOLN
OPHTHALMIC | 3 refills | Status: DC
  Filled 2021-05-20: qty 5, 20d supply, fill #0

## 2021-05-21 ENCOUNTER — Other Ambulatory Visit: Payer: Self-pay

## 2021-05-22 ENCOUNTER — Emergency Department
Admission: EM | Admit: 2021-05-22 | Discharge: 2021-05-22 | Disposition: A | Discharge status: Home / Self Care | Payer: 59 | Attending: Emergency Medicine | Admitting: Emergency Medicine

## 2021-05-22 ENCOUNTER — Other Ambulatory Visit: Payer: Self-pay

## 2021-05-22 DIAGNOSIS — Z87891 Personal history of nicotine dependence: Secondary | ICD-10-CM | POA: Diagnosis not present

## 2021-05-22 DIAGNOSIS — K5900 Constipation, unspecified: Secondary | ICD-10-CM | POA: Insufficient documentation

## 2021-05-22 DIAGNOSIS — J45909 Unspecified asthma, uncomplicated: Secondary | ICD-10-CM | POA: Insufficient documentation

## 2021-05-22 DIAGNOSIS — Z7951 Long term (current) use of inhaled steroids: Secondary | ICD-10-CM | POA: Insufficient documentation

## 2021-05-22 MED ORDER — POLYETHYLENE GLYCOL 3350 17 G PO PACK
17.0000 g | PACK | Freq: Every day | ORAL | 0 refills | Status: AC
  Filled 2021-05-22: qty 14, 14d supply, fill #0

## 2021-05-22 NOTE — ED Triage Notes (Addendum)
Pt presents to ER c/o difficulty pooping since yesterday.  Pt states he has been having to strain more than normal.  Pt states last BM was today, but didn't feel like he was able to get it all out.  Pt states stool is not hard. Pt states he has laxative at home, but is "scared to take it," and has been drinking coffee instead.

## 2021-05-22 NOTE — ED Provider Notes (Signed)
[facility]-EMERGENCY DEPARTMENT  ____________________________________________  Time seen: Approximately 11:20 PM  I have reviewed the triage vital signs and the nursing notes.   HISTORY  Chief Complaint Constipation   Historian Patient     HPI Spencer Tanner is a 45 y.o. male with a history of asthma, presents to the emergency department with constipation for the past 2 to 3 days.  Patient reports that he has had daily bowel movements but is producing less stool than normal.  Patient states that he has had some pressure but no abdominal pain.  No fever, chills, vomiting or diarrhea.   Past Medical History:  Diagnosis Date   Asthma      Immunizations up to date:  Yes.     Past Medical History:  Diagnosis Date   Asthma     There are no problems to display for this patient.   History reviewed. No pertinent surgical history.  Prior to Admission medications   Medication Sig Start Date End Date Taking? Authorizing Provider  polyethylene glycol (MIRALAX) 17 g packet Take 17 g by mouth daily for 7 days. 05/22/21 05/29/21 Yes Pia Mau M, PA-C  albuterol (PROVENTIL HFA;VENTOLIN HFA) 108 (90 Base) MCG/ACT inhaler Inhale 2 puffs into the lungs every 6 (six) hours as needed. 07/30/16   [provider]  albuterol (VENTOLIN HFA) 108 (90 Base) MCG/ACT inhaler USE AS DIRECTED AS NEEDED FOR SHORTNESS OF BREATH 04/20/20 04/20/21  Hande, Roderic Palau, MD  albuterol (VENTOLIN HFA) 108 (90 Base) MCG/ACT inhaler INHALE 2 PUFFS BY MOUTH EVERY 6 HOURS AS NEEDED FOR WHEEZING 12/03/20     albuterol (VENTOLIN HFA) 108 (90 Base) MCG/ACT inhaler Inhale 2 inhalations into the lungs every 6 (six) hours as needed 12/21/20     budesonide-formoterol (SYMBICORT) 160-4.5 MCG/ACT inhaler INHALE 2 PUFFS BY MOUTH 2 TIMES DAILY 11/22/19 11/21/20  Barbette Reichmann, MD  budesonide-formoterol (SYMBICORT) 160-4.5 MCG/ACT inhaler INHALE 2 PUFFS BY MOUTH 2 TIMES DAILY 12/20/20 12/20/21    budesonide-formoterol  (SYMBICORT) 160-4.5 MCG/ACT inhaler Inhale into the lungs. 12/21/20     budesonide-formoterol (SYMBICORT) 160-4.5 MCG/ACT inhaler Inhale 2 puffs into the lungs 2 (two) times daily. 05/20/21     chlorhexidine (PERIDEX) 0.12 % solution RINSE WITH 1/2 OZ TWICE A DAY (MORNING AND EVENING) AFTER BRUSHING 10/10/20 10/10/21    clindamycin (CLEOCIN) 300 MG capsule TAKE 1 CAPSULE (300 MG TOTAL) BY MOUTH 4 (FOUR) TIMES DAILY FOR 14 DAYS 07/24/20 07/24/21  Ignacia Bayley, PA-C  clindamycin (CLEOCIN) 300 MG capsule Take 1 capsule (300 mg total) by mouth 3 (three) times daily for 10 days 01/21/21     gabapentin (NEURONTIN) 100 MG capsule TAKE 1 CAPSULE BY MOUTH 3 TIMES DAILY 05/28/20 05/28/21  Barbette Reichmann, MD  ibuprofen (ADVIL) 800 MG tablet Take 1 tablet by mouth 2 times daily as needed for Pain 11/20/20     methocarbamol (ROBAXIN) 500 MG tablet Take 1 tablet (500 mg total) by mouth 3 (three) times daily. 06/05/19   Evon Slack, PA-C  olopatadine (PATANOL) 0.1 % ophthalmic solution Place 1 drop into both eyes 2 (two) times daily 05/20/21     omeprazole (PRILOSEC) 20 MG capsule Take by mouth. 06/04/17 06/04/18  [provider]  omeprazole (PRILOSEC) 20 MG capsule TAKE 1 CAPSULE BY MOUTH TWICE DAILY 05/28/20 05/28/21  Barbette Reichmann, MD  omeprazole (PRILOSEC) 20 MG capsule Take 1 capsule (20 mg total) by mouth 2 (two) times daily 01/21/21     predniSONE (DELTASONE) 10 MG tablet 10 day taper. 5,5,4,4,3,3,2,2,1,1 06/05/19  Evon Slack, PA-C  predniSONE (DELTASONE) 10 MG tablet Take 4 tablets daily x 2 days, then 3 tablets daily x 2 days, then 2 tablets daily x 2 days, then 1 tablet daily x 2 days. 12/21/20     SYMBICORT 160-4.5 MCG/ACT inhaler Inhale 2 puffs into the lungs 2 (two) times daily. 08/21/16   [provider]  tobramycin (TOBREX) 0.3 % ophthalmic solution Place 2 drops into the left eye every 4 (four) hours. 06/28/18   Tommi Rumps, PA-C    Allergies Amoxicillin and  Penicillins  History reviewed. No pertinent family history.  Social History Social History   Tobacco Use   Smoking status: Former   Smokeless tobacco: Never  Building services engineer Use: Never used  Substance Use Topics   Alcohol use: No    Alcohol/week: 0.0 standard drinks   Drug use: No     Review of Systems  Constitutional: No fever/chills Eyes:  No discharge ENT: No upper respiratory complaints. Respiratory: no cough. No SOB/ use of accessory muscles to breath Gastrointestinal: Patient has constipation.  Musculoskeletal: Negative for musculoskeletal pain. Skin: Negative for rash, abrasions, lacerations, ecchymosis.    ____________________________________________   PHYSICAL EXAM:  VITAL SIGNS: ED Triage Vitals  Enc Vitals Group     BP 05/22/21 2155 (!) 136/95     Pulse Rate 05/22/21 2155 86     Resp 05/22/21 2155 18     Temp 05/22/21 2155 99.2 F (37.3 C)     Temp Source 05/22/21 2155 Oral     SpO2 05/22/21 2155 97 %     Weight 05/22/21 2156 200 lb 9.9 oz (91 kg)     Height 05/22/21 2156 6\' 1"  (1.854 m)     Head Circumference --      Peak Flow --      Pain Score 05/22/21 2156 0     Pain Loc --      Pain Edu? --      Excl. in GC? --      Constitutional: Alert and oriented. Well appearing and in no acute distress. Eyes: Conjunctivae are normal. PERRL. EOMI. Head: Atraumatic. ENT: Cardiovascular: Normal rate, regular rhythm. Normal S1 and S2.  Good peripheral circulation. Respiratory: Normal respiratory effort without tachypnea or retractions. Lungs CTAB. Good air entry to the bases with no decreased or absent breath sounds Gastrointestinal: Bowel sounds x 4 quadrants. Soft and nontender to palpation. No guarding or rigidity. No distention. Musculoskeletal: Full range of motion to all extremities. No obvious deformities noted Neurologic:  Normal for age. No gross focal neurologic deficits are appreciated.  Skin:  Skin is warm, dry and intact. No rash  noted. Psychiatric: Mood and affect are normal for age. Speech and behavior are normal.   ____________________________________________   LABS (all labs ordered are listed, but only abnormal results are displayed)  Labs Reviewed - No data to display ____________________________________________  EKG   ____________________________________________  RADIOLOGY   No results found.  ____________________________________________    PROCEDURES  Procedure(s) performed:     Procedures     Medications - No data to display   ____________________________________________   INITIAL IMPRESSION / ASSESSMENT AND PLAN / ED COURSE  Pertinent labs & imaging results that were available during my care of the patient were reviewed by me and considered in my medical decision making (see chart for details).      Assessment and plan Constipation 45 year old male presents to the emergency department with constipation for the  past 2 to 3 days.  Recommended daily MiraLAX for the next 7 days.  Recommended return to the emergency department if he experiences worsening abdominal discomfort.  Patient voiced understanding and has easy access to the ED.     ____________________________________________  FINAL CLINICAL IMPRESSION(S) / ED DIAGNOSES  Final diagnoses:  Constipation, unspecified constipation type      NEW MEDICATIONS STARTED DURING THIS VISIT:  ED Discharge Orders          Ordered    polyethylene glycol (MIRALAX) 17 g packet  Daily        05/22/21 2242                This chart was dictated using voice recognition software/Dragon. Despite best efforts to proofread, errors can occur which can change the meaning. Any change was purely unintentional.     Orvil Feil, PA-C 05/22/21 2322    Merwyn Katos, MD 05/28/21 2101

## 2021-05-22 NOTE — Discharge Instructions (Addendum)
Take MiraLAX, 17 g once daily for the next 7 days.  You can mix powder in your favorite drink. If you develop abdominal pain, fever or vomiting, please return to the emergency department to be reevaluated.

## 2021-05-23 ENCOUNTER — Other Ambulatory Visit: Payer: Self-pay

## 2021-05-23 ENCOUNTER — Encounter: Payer: Self-pay | Admitting: Emergency Medicine

## 2021-05-23 ENCOUNTER — Emergency Department
Admission: EM | Admit: 2021-05-23 | Discharge: 2021-05-24 | Disposition: A | Discharge status: Home / Self Care | Payer: 59 | Attending: Emergency Medicine | Admitting: Emergency Medicine

## 2021-05-23 DIAGNOSIS — K5900 Constipation, unspecified: Secondary | ICD-10-CM | POA: Diagnosis not present

## 2021-05-23 DIAGNOSIS — M47816 Spondylosis without myelopathy or radiculopathy, lumbar region: Secondary | ICD-10-CM | POA: Diagnosis not present

## 2021-05-23 DIAGNOSIS — R140 Abdominal distension (gaseous): Secondary | ICD-10-CM | POA: Diagnosis not present

## 2021-05-23 DIAGNOSIS — J45909 Unspecified asthma, uncomplicated: Secondary | ICD-10-CM | POA: Insufficient documentation

## 2021-05-23 DIAGNOSIS — Z7951 Long term (current) use of inhaled steroids: Secondary | ICD-10-CM | POA: Diagnosis not present

## 2021-05-23 DIAGNOSIS — R109 Unspecified abdominal pain: Secondary | ICD-10-CM | POA: Diagnosis present

## 2021-05-23 DIAGNOSIS — Z87891 Personal history of nicotine dependence: Secondary | ICD-10-CM | POA: Diagnosis not present

## 2021-05-23 DIAGNOSIS — I10 Essential (primary) hypertension: Secondary | ICD-10-CM | POA: Diagnosis not present

## 2021-05-23 DIAGNOSIS — R9431 Abnormal electrocardiogram [ECG] [EKG]: Secondary | ICD-10-CM | POA: Diagnosis not present

## 2021-05-23 LAB — COMPREHENSIVE METABOLIC PANEL
ALT: 69 U/L — ABNORMAL HIGH (ref 0–44)
AST: 40 U/L (ref 15–41)
Albumin: 4.1 g/dL (ref 3.5–5.0)
Alkaline Phosphatase: 73 U/L (ref 38–126)
Anion gap: 8 (ref 5–15)
BUN: 12 mg/dL (ref 6–20)
CO2: 29 mmol/L (ref 22–32)
Calcium: 9.4 mg/dL (ref 8.9–10.3)
Chloride: 100 mmol/L (ref 98–111)
Creatinine, Ser: 1.24 mg/dL (ref 0.61–1.24)
GFR, Estimated: >60 mL/min (ref >60)
Glucose, Bld: 103 mg/dL — ABNORMAL HIGH (ref 70–99)
Potassium: 3.6 mmol/L (ref 3.5–5.1)
Sodium: 137 mmol/L (ref 135–145)
Total Bilirubin: 0.6 mg/dL (ref 0.3–1.2)
Total Protein: 7.5 g/dL (ref 6.5–8.1)

## 2021-05-23 LAB — CBC
HCT: 46.5 % (ref 39.0–52.0)
Hemoglobin: 16.3 g/dL (ref 13.0–17.0)
MCH: 30.4 pg (ref 26.0–34.0)
MCHC: 35.1 g/dL (ref 30.0–36.0)
MCV: 86.8 fL (ref 80.0–100.0)
Platelets: 304 10*3/uL (ref 150–400)
RBC: 5.36 MIL/uL (ref 4.22–5.81)
RDW: 13.3 % (ref 11.5–15.5)
WBC: 8.3 10*3/uL (ref 4.0–10.5)
nRBC: 0 % (ref 0.0–0.2)

## 2021-05-23 LAB — LIPASE, BLOOD: Lipase: 33 U/L (ref 11–51)

## 2021-05-23 NOTE — ED Triage Notes (Signed)
Pt to ED via POV with c/o epigastric tightness. Pt seen yesterday for same. Pt states has been unable to use the bathroom. Pt A&O x4 and ambulatory. Pt states pain is worse today that it was yesterday. Pt states yesterday was dx with constipation yesterday, pt states today had BM, states "nothing big has been coming out".  Pt states is also feeling weak. Pt also states feels like he gets loopy-headed when he is sitting on the toilet and straining to have a bowel movement.

## 2021-05-24 ENCOUNTER — Encounter: Payer: Self-pay | Admitting: Radiology

## 2021-05-24 ENCOUNTER — Emergency Department: Payer: 59

## 2021-05-24 DIAGNOSIS — I10 Essential (primary) hypertension: Secondary | ICD-10-CM | POA: Diagnosis not present

## 2021-05-24 DIAGNOSIS — K5900 Constipation, unspecified: Secondary | ICD-10-CM | POA: Diagnosis not present

## 2021-05-24 DIAGNOSIS — J45909 Unspecified asthma, uncomplicated: Secondary | ICD-10-CM | POA: Diagnosis not present

## 2021-05-24 DIAGNOSIS — Z7951 Long term (current) use of inhaled steroids: Secondary | ICD-10-CM | POA: Diagnosis not present

## 2021-05-24 DIAGNOSIS — R140 Abdominal distension (gaseous): Secondary | ICD-10-CM | POA: Diagnosis not present

## 2021-05-24 DIAGNOSIS — Z87891 Personal history of nicotine dependence: Secondary | ICD-10-CM | POA: Diagnosis not present

## 2021-05-24 DIAGNOSIS — M47816 Spondylosis without myelopathy or radiculopathy, lumbar region: Secondary | ICD-10-CM | POA: Diagnosis not present

## 2021-05-24 MED ORDER — LACTULOSE 10 GM/15ML PO SOLN
20.0000 g | Freq: Once | ORAL | Status: AC
  Administered 2021-05-24: 20 g via ORAL
  Filled 2021-05-24: qty 30

## 2021-05-24 NOTE — ED Provider Notes (Signed)
[facility] Emergency Department Provider Note  ____________________________________________   Event Date/Time   First MD Initiated Contact with Patient 05/23/21 2334     (approximate)  I have reviewed the triage vital signs and the nursing notes.   HISTORY  Chief Complaint Abdominal Pain and Weakness    HPI Spencer Tanner is a 45 y.o. male who reports no contributory chronic medical issues who presents for evaluation of abdominal tightness.  He said that he has not had a good bowel movement in a couple of days and his abdomen feels tight and like there is pressure all throughout.  He has had no nausea nor vomiting.  He said that when he sits on the toilet and he strains to poop, it makes him feel bad in general and he does not like to strain.  He was seen in the emergency department yesterday and diagnosed with constipation and it was suggested to him that he take MiraLAX.  He said he took an Ex-Lax last night and then took MiraLAX this morning and that he had a small bowel movement afterwards but only a little bit came out in small pieces and he still feels the pressure.  He said he wonders if he needs "a scan or something" to make sure everything is okay.  He denies having any pain at any point.  He has had no nausea nor vomiting.  He denies fever, sore throat, shortness of breath, any recent trauma, and any pain in his testicles or penis.  He describes the symptoms as severe and nothing in particular seems to make it better or worse.     Past Medical History:  Diagnosis Date   Asthma     There are no problems to display for this patient.   History reviewed. No pertinent surgical history.  Prior to Admission medications   Medication Sig Start Date End Date Taking? Authorizing Provider  albuterol (PROVENTIL HFA;VENTOLIN HFA) 108 (90 Base) MCG/ACT inhaler Inhale 2 puffs into the lungs every 6 (six) hours as needed. 07/30/16   [provider]  albuterol (VENTOLIN HFA) 108 (90 Base) MCG/ACT inhaler USE AS DIRECTED AS NEEDED FOR SHORTNESS OF BREATH 04/20/20 04/20/21  Hande, Roderic Palau, MD  albuterol (VENTOLIN HFA) 108 (90 Base) MCG/ACT inhaler INHALE 2 PUFFS BY MOUTH EVERY 6 HOURS AS NEEDED FOR WHEEZING 12/03/20     albuterol (VENTOLIN HFA) 108 (90 Base) MCG/ACT inhaler Inhale 2 inhalations into the lungs every 6 (six) hours as needed 12/21/20     budesonide-formoterol (SYMBICORT) 160-4.5 MCG/ACT inhaler INHALE 2 PUFFS BY MOUTH 2 TIMES DAILY 11/22/19 11/21/20  Barbette Reichmann, MD  budesonide-formoterol (SYMBICORT) 160-4.5 MCG/ACT inhaler INHALE 2 PUFFS BY MOUTH 2 TIMES DAILY 12/20/20 12/20/21    budesonide-formoterol (SYMBICORT) 160-4.5 MCG/ACT inhaler Inhale into the lungs. 12/21/20     budesonide-formoterol (SYMBICORT) 160-4.5 MCG/ACT inhaler Inhale 2 puffs into the lungs 2 (two) times daily. 05/20/21     chlorhexidine (PERIDEX) 0.12 % solution RINSE WITH 1/2 OZ TWICE A DAY (MORNING AND EVENING) AFTER BRUSHING 10/10/20 10/10/21    clindamycin (CLEOCIN) 300 MG capsule TAKE 1 CAPSULE (300 MG TOTAL) BY MOUTH 4 (FOUR) TIMES DAILY FOR 14 DAYS 07/24/20 07/24/21  Ignacia Bayley, PA-C  clindamycin (CLEOCIN) 300 MG capsule Take 1 capsule (300 mg total) by mouth 3 (three) times daily for 10 days 01/21/21     gabapentin (NEURONTIN) 100 MG capsule TAKE 1 CAPSULE BY MOUTH 3 TIMES DAILY 05/28/20 05/28/21  Barbette Reichmann, MD  ibuprofen (  ADVIL) 800 MG tablet Take 1 tablet by mouth 2 times daily as needed for Pain 11/20/20     methocarbamol (ROBAXIN) 500 MG tablet Take 1 tablet (500 mg total) by mouth 3 (three) times daily. 06/05/19   Evon Slack, PA-C  olopatadine (PATANOL) 0.1 % ophthalmic solution Place 1 drop into both eyes 2 (two) times daily 05/20/21     omeprazole (PRILOSEC) 20 MG capsule Take by mouth. 06/04/17 06/04/18  [provider]  omeprazole (PRILOSEC) 20 MG capsule TAKE 1 CAPSULE BY MOUTH TWICE DAILY 05/28/20 05/28/21  Barbette Reichmann, MD  omeprazole (PRILOSEC) 20 MG capsule Take 1 capsule (20 mg total) by mouth 2 (two) times daily 01/21/21     polyethylene glycol (MIRALAX) 17 g packet Take 17 g by mouth daily for 7 days. 05/22/21 05/29/21  Orvil Feil, PA-C  predniSONE (DELTASONE) 10 MG tablet 10 day taper. 5,5,4,4,3,3,2,2,1,1 06/05/19   Evon Slack, PA-C  predniSONE (DELTASONE) 10 MG tablet Take 4 tablets daily x 2 days, then 3 tablets daily x 2 days, then 2 tablets daily x 2 days, then 1 tablet daily x 2 days. 12/21/20     SYMBICORT 160-4.5 MCG/ACT inhaler Inhale 2 puffs into the lungs 2 (two) times daily. 08/21/16   [provider]  tobramycin (TOBREX) 0.3 % ophthalmic solution Place 2 drops into the left eye every 4 (four) hours. 06/28/18   Tommi Rumps, PA-C    Allergies Amoxicillin and Penicillins  History reviewed. No pertinent family history.  Social History Social History   Tobacco Use   Smoking status: Former   Smokeless tobacco: Never  Building services engineer Use: Never used  Substance Use Topics   Alcohol use: No    Alcohol/week: 0.0 standard drinks   Drug use: No    Review of Systems Constitutional: No fever/chills Eyes: No visual changes. ENT: No sore throat. Cardiovascular: Denies chest pain. Respiratory: Denies shortness of breath. Gastrointestinal: Positive for constipation and abdominal tightness.  Denies nausea and vomiting. Genitourinary: Negative for dysuria. Musculoskeletal: Negative for neck pain.  Negative for back pain. Integumentary: Negative for rash. Neurological: Negative for headaches, focal weakness or numbness.   ____________________________________________   PHYSICAL EXAM:  VITAL SIGNS: ED Triage Vitals  Enc Vitals Group     BP 05/23/21 1953 (!) 154/97     Pulse Rate 05/23/21 1953 81     Resp 05/23/21 1953 17     Temp 05/23/21 1953 98.1 F (36.7 C)     Temp Source 05/23/21 1953 Oral     SpO2 05/23/21 1953 98 %     Weight 05/23/21 1953  91 kg (200 lb 9.9 oz)     Height 05/23/21 1953 1.854 m (6\' 1" )     Head Circumference --      Peak Flow --      Pain Score 05/23/21 2339 9     Pain Loc --      Pain Edu? --      Excl. in GC? --     Constitutional: Alert and oriented.  Eyes: Conjunctivae are normal.  Head: Atraumatic. Nose: No congestion/rhinnorhea. Mouth/Throat: Patient is wearing a mask. Neck: No stridor.  No meningeal signs.   Cardiovascular: Normal rate, regular rhythm. Good peripheral circulation. Respiratory: Normal respiratory effort.  No retractions. Gastrointestinal: Soft and nontender without any obvious distention.  No tenderness to palpation even to firm palpation. Musculoskeletal: No lower extremity tenderness nor edema. No gross deformities of extremities. Neurologic:  Normal speech and language. No gross focal neurologic deficits are appreciated.  Skin:  Skin is warm, dry and intact. Psychiatric: Mood and affect are normal. Speech and behavior are normal.  ____________________________________________   LABS (all labs ordered are listed, but only abnormal results are displayed)  Labs Reviewed  COMPREHENSIVE METABOLIC PANEL - Abnormal; Notable for the following components:      Result Value   Glucose, Bld 103 (*)    ALT 69 (*)    All other components within normal limits  LIPASE, BLOOD  CBC   ____________________________________________  EKG  ED ECG REPORT I, Loleta Rose, the attending physician, personally viewed and interpreted this ECG.  Date: 05/23/2021 EKG Time: 19: 55 Rate: 77 Rhythm: normal sinus rhythm QRS Axis: normal Intervals: normal ST/T Wave abnormalities: Non-specific ST segment / T-wave changes, but no clear evidence of acute ischemia. Narrative Interpretation: no definitive evidence of acute ischemia; does not meet STEMI criteria.  ____________________________________________  RADIOLOGY I, Loleta Rose, personally viewed and evaluated these images (plain  radiographs) as part of my medical decision making, as well as reviewing the written report by the radiologist.  ED MD interpretation: No acute abnormalities on xrays.  Official radiology report(s): DG Abdomen Acute W/Chest  Result Date: 05/24/2021 CLINICAL DATA:  Abdominal distension, initial encounter EXAM: DG ABDOMEN ACUTE WITH 1 VIEW CHEST COMPARISON:  None. FINDINGS: Cardiac shadow is within normal limits. The lungs are clear bilaterally. No acute bony abnormality is noted. Scattered large and small bowel gas is noted. No obstructive changes are seen. No free air is seen. No significant retained fecal material is noted. Bony structures show mild degenerative change of the lumbar spine. IMPRESSION: No acute abnormality in the chest and abdomen to correspond with the given clinical history. Electronically Signed   By: Alcide Clever M.D.   On: 05/24/2021 00:53    ____________________________________________   INITIAL IMPRESSION / MDM / ASSESSMENT AND PLAN / ED COURSE  As part of my medical decision making, I reviewed the following data within the electronic MEDICAL RECORD NUMBER Nursing notes reviewed and incorporated, Labs reviewed , Old chart reviewed, Radiograph reviewed , and Notes from prior ED visits   Differential diagnosis includes, but is not limited to, constipation, obstipation, SBO/ileus, electrolyte or metabolic abnormality.  Vital signs are stable other than some mild hypertension.  He has a very reassuring physical exam including no tenderness to palpation of the abdomen.  He has had no vomiting and I strongly doubt SBO/ileus.  He is generally healthy and well-appearing with no significant or contributory chronic medical issues.  I had a talk with him about constipation and explained that a single dose of MiraLAX is unlikely to fix it.  I explained that he will likely need to take multiple doses of medications and possibly some additional stool softeners.  To further rule out any  emergent/urgent medical conditions, I will obtain an acute abdomen series to look for evidence of obstruction and I have ordered a dose of lactulose 20 g p.o., but I explained that most likely he will be best served by an aggressive bowel regimen at home.  He said that he understands and agrees with the plan.       Clinical Course as of 05/24/21 0221  Fri May 24, 2021  0103 DG Abdomen Acute W/Chest I personally reviewed the patient's imaging and agree with the radiologist's interpretation that there are no acute abnormalities on the plain films. [CF]  0220 Patient will take the lactulose  once he gets home.  He said he feels okay right now and is comfortable with the plan for discharge.  I gave my usual and customary constipation management recommendations and return precautions and he agrees with the plan. [CF]    Clinical Course User Index [CF] Loleta Rose, MD     ____________________________________________  FINAL CLINICAL IMPRESSION(S) / ED DIAGNOSES  Final diagnoses:  Constipation, unspecified constipation type     MEDICATIONS GIVEN DURING THIS VISIT:  Medications  lactulose (CHRONULAC) 10 GM/15ML solution 20 g (20 g Oral Given 05/24/21 0015)     ED Discharge Orders     None        Note:  This document was prepared using Dragon voice recognition software and may include unintentional dictation errors.   Loleta Rose, MD 05/24/21 [id number]

## 2021-05-24 NOTE — Discharge Instructions (Signed)
You were seen in the emergency department today for constipation.  We recommend that you use one or more of the following over-the-counter medications in the order described:   1)  Miralax (powder):  This medication works by drawing additional fluid into your intestines and helps to flush out your stool.  Mix the powder with water or juice according to label instructions.  Be sure to use the recommended amount of water or juice when you mix up the powder.  Plenty of fluids will help to prevent constipation. 2)  Colace (or Dulcolax) 100 mg:  This is a stool softener, and you may take it once or twice a day as needed. 3)  Senna tablets:  This is a bowel stimulant that will help "push" out your stool. It is the next step to add after you have tried a stool softener.  If the three options above are not working, even when you use them together, look for magnesium citrate at the pharmacy (it is usually in a small glass bottle).  Drink the bottle according to the label instructions.  You may also want to consider using glycerin suppositories, which you insert into your rectum.  You hold it in place and is dissolves and softens your stool and stimulates your bowels.  You could also consider using an enema, which is also available over the counter.  Remember that narcotic pain medications are constipating, so avoid them or minimize their use.  Drink plenty of fluids.  Please return to the Emergency Department immediately if you develop new or worsening symptoms that concern you, such as (but not limited to) fever > 101 degrees, severe abdominal pain, or persistent vomiting.  

## 2021-05-28 ENCOUNTER — Encounter: Payer: Self-pay | Admitting: Emergency Medicine

## 2021-05-28 ENCOUNTER — Other Ambulatory Visit: Payer: Self-pay

## 2021-05-28 ENCOUNTER — Emergency Department: Payer: 59

## 2021-05-28 DIAGNOSIS — Z7982 Long term (current) use of aspirin: Secondary | ICD-10-CM | POA: Insufficient documentation

## 2021-05-28 DIAGNOSIS — I4891 Unspecified atrial fibrillation: Secondary | ICD-10-CM | POA: Insufficient documentation

## 2021-05-28 DIAGNOSIS — Z79899 Other long term (current) drug therapy: Secondary | ICD-10-CM | POA: Insufficient documentation

## 2021-05-28 DIAGNOSIS — Z7951 Long term (current) use of inhaled steroids: Secondary | ICD-10-CM | POA: Insufficient documentation

## 2021-05-28 DIAGNOSIS — K5900 Constipation, unspecified: Secondary | ICD-10-CM | POA: Insufficient documentation

## 2021-05-28 DIAGNOSIS — Z87891 Personal history of nicotine dependence: Secondary | ICD-10-CM | POA: Diagnosis not present

## 2021-05-28 DIAGNOSIS — R079 Chest pain, unspecified: Secondary | ICD-10-CM | POA: Diagnosis not present

## 2021-05-28 DIAGNOSIS — J45909 Unspecified asthma, uncomplicated: Secondary | ICD-10-CM | POA: Insufficient documentation

## 2021-05-28 DIAGNOSIS — R0789 Other chest pain: Secondary | ICD-10-CM | POA: Diagnosis present

## 2021-05-28 LAB — CBC
HCT: 48.4 % (ref 39.0–52.0)
Hemoglobin: 16.9 g/dL (ref 13.0–17.0)
MCH: 29.5 pg (ref 26.0–34.0)
MCHC: 34.9 g/dL (ref 30.0–36.0)
MCV: 84.6 fL (ref 80.0–100.0)
Platelets: 322 10*3/uL (ref 150–400)
RBC: 5.72 MIL/uL (ref 4.22–5.81)
RDW: 13.1 % (ref 11.5–15.5)
WBC: 7.8 10*3/uL (ref 4.0–10.5)
nRBC: 0 % (ref 0.0–0.2)

## 2021-05-28 LAB — BASIC METABOLIC PANEL
Anion gap: 10 (ref 5–15)
BUN: 13 mg/dL (ref 6–20)
CO2: 26 mmol/L (ref 22–32)
Calcium: 9.4 mg/dL (ref 8.9–10.3)
Chloride: 101 mmol/L (ref 98–111)
Creatinine, Ser: 1.23 mg/dL (ref 0.61–1.24)
GFR, Estimated: >60 mL/min (ref >60)
Glucose, Bld: 96 mg/dL (ref 70–99)
Potassium: 4.4 mmol/L (ref 3.5–5.1)
Sodium: 137 mmol/L (ref 135–145)

## 2021-05-28 LAB — TROPONIN I (HIGH SENSITIVITY)
Troponin I (High Sensitivity): 4 ng/L (ref <18)
Troponin I (High Sensitivity): 5 ng/L (ref <18)

## 2021-05-28 NOTE — ED Triage Notes (Signed)
Pt via POV from home. Pt c/o R sided CP that started today. Denies NVD. Denies SOB. Denies cardiac hx. Pt describes pain as non-radiating and pressure. Pt is A&OX4 and NAD

## 2021-05-29 ENCOUNTER — Emergency Department
Admission: EM | Admit: 2021-05-29 | Discharge: 2021-05-29 | Disposition: A | Discharge status: Home / Self Care | Payer: 59 | Attending: Emergency Medicine | Admitting: Emergency Medicine

## 2021-05-29 ENCOUNTER — Other Ambulatory Visit: Payer: Self-pay

## 2021-05-29 ENCOUNTER — Emergency Department: Payer: 59

## 2021-05-29 ENCOUNTER — Emergency Department
Admission: EM | Admit: 2021-05-29 | Discharge: 2021-05-30 | Disposition: A | Discharge status: Home / Self Care | Payer: 59 | Source: Home / Self Care | Attending: Emergency Medicine | Admitting: Emergency Medicine

## 2021-05-29 DIAGNOSIS — Z87891 Personal history of nicotine dependence: Secondary | ICD-10-CM | POA: Diagnosis not present

## 2021-05-29 DIAGNOSIS — K5900 Constipation, unspecified: Secondary | ICD-10-CM | POA: Insufficient documentation

## 2021-05-29 DIAGNOSIS — Z7951 Long term (current) use of inhaled steroids: Secondary | ICD-10-CM | POA: Diagnosis not present

## 2021-05-29 DIAGNOSIS — Z7982 Long term (current) use of aspirin: Secondary | ICD-10-CM | POA: Insufficient documentation

## 2021-05-29 DIAGNOSIS — Z79899 Other long term (current) drug therapy: Secondary | ICD-10-CM | POA: Insufficient documentation

## 2021-05-29 DIAGNOSIS — J45909 Unspecified asthma, uncomplicated: Secondary | ICD-10-CM | POA: Insufficient documentation

## 2021-05-29 DIAGNOSIS — I4891 Unspecified atrial fibrillation: Secondary | ICD-10-CM | POA: Diagnosis not present

## 2021-05-29 LAB — MAGNESIUM: Magnesium: 2.4 mg/dL (ref 1.7–2.4)

## 2021-05-29 LAB — TSH: TSH: 2.111 u[IU]/mL (ref 0.350–4.500)

## 2021-05-29 LAB — T4, FREE: Free T4: 0.97 ng/dL (ref 0.61–1.12)

## 2021-05-29 LAB — D-DIMER, QUANTITATIVE: D-Dimer, Quant: 0.29 ug/mL-FEU (ref 0.00–0.50)

## 2021-05-29 MED ORDER — BISACODYL 10 MG RE SUPP: 10.0000 mg | Freq: Four times a day (QID) | RECTAL | 0 refills | Status: DC | PRN

## 2021-05-29 MED ORDER — MAGNESIUM SULFATE 2 GM/50ML IV SOLN
2.0000 g | Freq: Once | INTRAVENOUS | Status: AC
  Administered 2021-05-29: 2 g via INTRAVENOUS
  Filled 2021-05-29: qty 50

## 2021-05-29 MED ORDER — MAGNESIUM CITRATE PO SOLN: 1.0000 | Freq: Every day | ORAL | 3 refills | Status: DC | PRN

## 2021-05-29 MED ORDER — ASPIRIN 81 MG PO CHEW
324.0000 mg | CHEWABLE_TABLET | Freq: Once | ORAL | Status: AC
  Administered 2021-05-29: 324 mg via ORAL
  Filled 2021-05-29: qty 4

## 2021-05-29 MED ORDER — DILTIAZEM HCL ER COATED BEADS 180 MG PO CP24
180.0000 mg | ORAL_CAPSULE | Freq: Once | ORAL | Status: AC
  Administered 2021-05-29: 180 mg via ORAL
  Filled 2021-05-29: qty 1

## 2021-05-29 MED ORDER — BISACODYL 10 MG RE SUPP: 10.0000 mg | Freq: Four times a day (QID) | RECTAL | 0 refills | Status: AC | PRN

## 2021-05-29 MED ORDER — ASPIRIN EC 325 MG PO TBEC
325.0000 mg | DELAYED_RELEASE_TABLET | Freq: Every day | ORAL | 3 refills | Status: AC
  Filled 2021-05-29: qty 100, 100d supply, fill #0

## 2021-05-29 MED ORDER — DILTIAZEM HCL 25 MG/5ML IV SOLN
15.0000 mg | Freq: Once | INTRAVENOUS | Status: AC
  Administered 2021-05-29: 15 mg via INTRAVENOUS
  Filled 2021-05-29: qty 5

## 2021-05-29 MED ORDER — DILTIAZEM HCL ER COATED BEADS 180 MG PO CP24
180.0000 mg | ORAL_CAPSULE | Freq: Every day | ORAL | 11 refills | Status: DC
  Filled 2021-05-29: qty 30, 30d supply, fill #0
  Filled 2021-06-21: qty 30, 30d supply, fill #1
  Filled 2021-07-25: qty 30, 30d supply, fill #2
  Filled 2021-08-26: qty 30, 30d supply, fill #3
  Filled 2021-09-24: qty 30, 30d supply, fill #4
  Filled 2021-10-24: qty 30, 30d supply, fill #5

## 2021-05-29 NOTE — ED Provider Notes (Signed)
[facility] Emergency Department Provider Note  ____________________________________________  Time seen: Approximately 2:26 AM  I have reviewed the triage vital signs and the nursing notes.   HISTORY  Chief Complaint Chest Pain   HPI Spencer Tanner is a 45 y.o. male with a history of asthma who presents for evaluation of chest pain.  Patient reports that he was just sitting around earlier today when he started having chest pain.  He reports that the pain was located on the left side of his chest, sharp, lasting several minutes at a time and intermittent.  No shortness of breath, no palpitations, no dizziness, no cough, no congestion.  He reports that the pain has now resolved.  He denies any personal or family history of heart disease, PE or DVT, recent travel immobilization, leg pain or swelling, hemoptysis or exogenous hormones.  He is not a smoker.  Past Medical History:  Diagnosis Date   Asthma     Prior to Admission medications   Medication Sig Start Date End Date Taking? Authorizing Provider  aspirin EC 325 MG tablet Take 1 tablet (325 mg total) by mouth daily. 05/29/21 05/29/22 Yes Crosby Oriordan, [place], MD  diltiazem (CARDIZEM CD) 180 MG 24 hr capsule Take 1 capsule (180 mg total) by mouth daily. 05/29/21 05/29/22 Yes Lennox Dolberry, [place], MD  albuterol (PROVENTIL HFA;VENTOLIN HFA) 108 (90 Base) MCG/ACT inhaler Inhale 2 puffs into the lungs every 6 (six) hours as needed. 07/30/16   [provider]  albuterol (VENTOLIN HFA) 108 (90 Base) MCG/ACT inhaler USE AS DIRECTED AS NEEDED FOR SHORTNESS OF BREATH 04/20/20 04/20/21  Hande, Roderic Palau, MD  albuterol (VENTOLIN HFA) 108 (90 Base) MCG/ACT inhaler INHALE 2 PUFFS BY MOUTH EVERY 6 HOURS AS NEEDED FOR WHEEZING 12/03/20     albuterol (VENTOLIN HFA) 108 (90 Base) MCG/ACT inhaler Inhale 2 inhalations into the lungs every 6 (six) hours as needed 12/21/20     budesonide-formoterol (SYMBICORT) 160-4.5  MCG/ACT inhaler INHALE 2 PUFFS BY MOUTH 2 TIMES DAILY 11/22/19 11/21/20  Barbette Reichmann, MD  budesonide-formoterol (SYMBICORT) 160-4.5 MCG/ACT inhaler INHALE 2 PUFFS BY MOUTH 2 TIMES DAILY 12/20/20 12/20/21    budesonide-formoterol (SYMBICORT) 160-4.5 MCG/ACT inhaler Inhale into the lungs. 12/21/20     budesonide-formoterol (SYMBICORT) 160-4.5 MCG/ACT inhaler Inhale 2 puffs into the lungs 2 (two) times daily. 05/20/21     chlorhexidine (PERIDEX) 0.12 % solution RINSE WITH 1/2 OZ TWICE A DAY (MORNING AND EVENING) AFTER BRUSHING 10/10/20 10/10/21    clindamycin (CLEOCIN) 300 MG capsule TAKE 1 CAPSULE (300 MG TOTAL) BY MOUTH 4 (FOUR) TIMES DAILY FOR 14 DAYS 07/24/20 07/24/21  Ignacia Bayley, PA-C  clindamycin (CLEOCIN) 300 MG capsule Take 1 capsule (300 mg total) by mouth 3 (three) times daily for 10 days 01/21/21     gabapentin (NEURONTIN) 100 MG capsule TAKE 1 CAPSULE BY MOUTH 3 TIMES DAILY 05/28/20 05/28/21  Barbette Reichmann, MD  ibuprofen (ADVIL) 800 MG tablet Take 1 tablet by mouth 2 times daily as needed for Pain 11/20/20     methocarbamol (ROBAXIN) 500 MG tablet Take 1 tablet (500 mg total) by mouth 3 (three) times daily. 06/05/19   Evon Slack, PA-C  olopatadine (PATANOL) 0.1 % ophthalmic solution Place 1 drop into both eyes 2 (two) times daily 05/20/21     omeprazole (PRILOSEC) 20 MG capsule Take by mouth. 06/04/17 06/04/18  [provider]  omeprazole (PRILOSEC) 20 MG capsule TAKE 1 CAPSULE BY MOUTH TWICE DAILY 05/28/20 05/28/21  Barbette Reichmann, MD  omeprazole (  PRILOSEC) 20 MG capsule Take 1 capsule (20 mg total) by mouth 2 (two) times daily 01/21/21     polyethylene glycol (MIRALAX) 17 g packet Take 17 g by mouth daily for 7 days. 05/22/21 05/29/21  Orvil Feil, PA-C  predniSONE (DELTASONE) 10 MG tablet 10 day taper. 5,5,4,4,3,3,2,2,1,1 06/05/19   Evon Slack, PA-C  predniSONE (DELTASONE) 10 MG tablet Take 4 tablets daily x 2 days, then 3 tablets daily x 2 days, then 2 tablets daily x 2  days, then 1 tablet daily x 2 days. 12/21/20     SYMBICORT 160-4.5 MCG/ACT inhaler Inhale 2 puffs into the lungs 2 (two) times daily. 08/21/16   [provider]  tobramycin (TOBREX) 0.3 % ophthalmic solution Place 2 drops into the left eye every 4 (four) hours. 06/28/18   Tommi Rumps, PA-C    Allergies Amoxicillin and Penicillins  History reviewed. No pertinent family history.  Social History Social History   Tobacco Use   Smoking status: Former   Smokeless tobacco: Never  Building services engineer Use: Never used  Substance Use Topics   Alcohol use: No    Alcohol/week: 0.0 standard drinks   Drug use: No    Review of Systems  Constitutional: Negative for fever. Eyes: Negative for visual changes. ENT: Negative for sore throat. Neck: No neck pain  Cardiovascular: + chest pain. Respiratory: Negative for shortness of breath. Gastrointestinal: Negative for abdominal pain, vomiting or diarrhea. Genitourinary: Negative for dysuria. Musculoskeletal: Negative for back pain. Skin: Negative for rash. Neurological: Negative for headaches, weakness or numbness. Psych: No SI or HI  ____________________________________________   PHYSICAL EXAM:  VITAL SIGNS: Vitals:   05/29/21 0130 05/29/21 0200  BP: 117/68 111/74  Pulse: 65 87  Resp: 13 18  Temp:    SpO2: 94% 98%     Constitutional: Alert and oriented. Well appearing and in no apparent distress. HEENT:      Head: Normocephalic and atraumatic.         Eyes: Conjunctivae are normal. Sclera is non-icteric.       Mouth/Throat: Mucous membranes are moist.       Neck: Supple with no signs of meningismus. Cardiovascular: Irregularly irregular rhythm with tachycardic rate  respiratory: Normal respiratory effort. Lungs are clear to auscultation bilaterally.  Gastrointestinal: Soft, non tender, and non distended with positive bowel sounds. No rebound or guarding. Genitourinary: No CVA tenderness. Musculoskeletal:  No  edema, cyanosis, or erythema of extremities. Neurologic: Normal speech and language. Face is symmetric. Moving all extremities. No gross focal neurologic deficits are appreciated. Skin: Skin is warm, dry and intact. No rash noted. Psychiatric: Mood and affect are normal. Speech and behavior are normal.  ____________________________________________   LABS (all labs ordered are listed, but only abnormal results are displayed)  Labs Reviewed  BASIC METABOLIC PANEL  CBC  D-DIMER, QUANTITATIVE  TSH  T4, FREE  MAGNESIUM  TROPONIN I (HIGH SENSITIVITY)  TROPONIN I (HIGH SENSITIVITY)   ____________________________________________  EKG  ED ECG REPORT I, Nita Sickle, the attending physician, personally viewed and interpreted this ECG.  Atrial fibrillation with rate of 123 with no ST elevations or depressions.  New when compared to prior. ____________________________________________  RADIOLOGY  I have personally reviewed the images performed during this visit and I agree with the Radiologist's read.   Interpretation by Radiologist:  DG Chest 2 View  Result Date: 05/28/2021 CLINICAL DATA:  Right-sided chest pain. EXAM: CHEST - 2 VIEW COMPARISON:  August 20, 2020  FINDINGS: The heart size and mediastinal contours are within normal limits. Both lungs are clear. The visualized skeletal structures are unremarkable. IMPRESSION: No active cardiopulmonary disease. Electronically Signed   By: Aram Candela M.D.   On: 05/28/2021 19:30     ____________________________________________   PROCEDURES  Procedure(s) performed:yes .1-3 Lead EKG Interpretation Performed by: Nita Sickle, MD Authorized by: Nita Sickle, MD     Interpretation: abnormal     ECG rate assessment: tachycardic     Rhythm: atrial fibrillation     Ectopy: none     Conduction: normal     Critical Care performed:  None ____________________________________________   INITIAL IMPRESSION /  ASSESSMENT AND PLAN / ED COURSE  45 y.o. male with a history of asthma who presents for evaluation of chest pain.  On arrival to the room patient was noted to be in A. fib with RVR on telemetry.  He reports that his pain had fully resolved.  EKG showing no signs of ischemia.  He was given a dose of IV Cardizem and started on p.o. Cardizem.  Labs showing normal magnesium and potassium.  2 high-sensitivity troponins with no signs of demand ischemia.  D-dimer negative to rule out PE.  No signs of thyroid disease, dehydration, anemia, or significant electrolyte derangements.  CHA2DS2-VASc of 0 therefore patient will be started on aspirin only.  Patient was monitored on telemetry for 1 hour post therapies and remains with rate in the 80s and completely asymptomatic.  Will discharge home on Cardizem and aspirin.  Referral to cardiology has been placed.  Discussed my standard return precautions for palpitations, chest pain, syncope, shortness of breath. old medical records reviewed.      _____________________________________________ Please note:  Patient was evaluated in Emergency Department today for the symptoms described in the history of present illness. Patient was evaluated in the context of the global COVID-19 pandemic, which necessitated consideration that the patient might be at risk for infection with the SARS-CoV-2 virus that causes COVID-19. Institutional protocols and algorithms that pertain to the evaluation of patients at risk for COVID-19 are in a state of rapid change based on information released by regulatory bodies including the CDC and federal and state organizations. These policies and algorithms were followed during the patient's care in the ED.  Some ED evaluations and interventions may be delayed as a result of limited staffing during the pandemic.   [place] Controlled Substance Database was reviewed by me. ____________________________________________   FINAL CLINICAL IMPRESSION(S) / ED  DIAGNOSES   Final diagnoses:  Atrial fibrillation with RVR (HCC)      NEW MEDICATIONS STARTED DURING THIS VISIT:  ED Discharge Orders          Ordered    diltiazem (CARDIZEM CD) 180 MG 24 hr capsule  Daily        05/29/21 0224    aspirin EC 325 MG tablet  Daily        05/29/21 0224    Ambulatory referral to Cardiology       Comments: New onset afib   05/29/21 0225             Note:  This document was prepared using Dragon voice recognition software and may include unintentional dictation errors.    Nita Sickle, MD 05/29/21 [phone]

## 2021-05-29 NOTE — ED Provider Notes (Signed)
[facility] Emergency Department Provider Note  ____________________________________________  Time seen: Approximately 8:30 PM  I have reviewed the triage vital signs and the nursing notes.   HISTORY  Chief Complaint Constipation    HPI Spencer Tanner is a 45 y.o. male with a past history of asthma who comes ED complaining of constipation for the past week.  He has generalized abdominal discomfort.  No nausea or vomiting.  Eating normally.  He is having hard small bowel movements and passing gas.  No history of abdominal surgery or hernia.  No fever.  Symptoms are constant, waxing and waning.  He has tried increasing fiber intake with Chia seeds and salads without relief.  No aggravating factors.    Past Medical History:  Diagnosis Date   Asthma      There are no problems to display for this patient.    History reviewed. No pertinent surgical history.   Prior to Admission medications   Medication Sig Start Date End Date Taking? Authorizing Provider  albuterol (PROVENTIL HFA;VENTOLIN HFA) 108 (90 Base) MCG/ACT inhaler Inhale 2 puffs into the lungs every 6 (six) hours as needed. 07/30/16   [provider]  albuterol (VENTOLIN HFA) 108 (90 Base) MCG/ACT inhaler USE AS DIRECTED AS NEEDED FOR SHORTNESS OF BREATH 04/20/20 04/20/21  Hande, Roderic Palau, MD  albuterol (VENTOLIN HFA) 108 (90 Base) MCG/ACT inhaler INHALE 2 PUFFS BY MOUTH EVERY 6 HOURS AS NEEDED FOR WHEEZING 12/03/20     albuterol (VENTOLIN HFA) 108 (90 Base) MCG/ACT inhaler Inhale 2 inhalations into the lungs every 6 (six) hours as needed 12/21/20     aspirin EC 325 MG tablet Take 1 tablet (325 mg total) by mouth daily. 05/29/21 05/29/22  Nita Sickle, MD  bisacodyl (DULCOLAX) 10 MG suppository Place 1 suppository (10 mg total) rectally every 6 (six) hours as needed for up to 3 days for moderate constipation. 05/29/21 06/01/21 Yes Sharman Cheek, MD  budesonide-formoterol  [facility]) 160-4.5 MCG/ACT inhaler INHALE 2 PUFFS BY MOUTH 2 TIMES DAILY 11/22/19 11/21/20  Barbette Reichmann, MD  budesonide-formoterol (SYMBICORT) 160-4.5 MCG/ACT inhaler INHALE 2 PUFFS BY MOUTH 2 TIMES DAILY 12/20/20 12/20/21    budesonide-formoterol (SYMBICORT) 160-4.5 MCG/ACT inhaler Inhale into the lungs. 12/21/20     budesonide-formoterol (SYMBICORT) 160-4.5 MCG/ACT inhaler Inhale 2 puffs into the lungs 2 (two) times daily. 05/20/21     chlorhexidine (PERIDEX) 0.12 % solution RINSE WITH 1/2 OZ TWICE A DAY (MORNING AND EVENING) AFTER BRUSHING 10/10/20 10/10/21    clindamycin (CLEOCIN) 300 MG capsule TAKE 1 CAPSULE (300 MG TOTAL) BY MOUTH 4 (FOUR) TIMES DAILY FOR 14 DAYS 07/24/20 07/24/21  Ignacia Bayley, PA-C  clindamycin (CLEOCIN) 300 MG capsule Take 1 capsule (300 mg total) by mouth 3 (three) times daily for 10 days 01/21/21     diltiazem (CARDIZEM CD) 180 MG 24 hr capsule Take 1 capsule (180 mg total) by mouth daily. 05/29/21 05/29/22  Nita Sickle, MD  gabapentin (NEURONTIN) 100 MG capsule TAKE 1 CAPSULE BY MOUTH 3 TIMES DAILY 05/28/20 05/28/21  Barbette Reichmann, MD  ibuprofen (ADVIL) 800 MG tablet Take 1 tablet by mouth 2 times daily as needed for Pain 11/20/20     magnesium citrate SOLN Take 296 mLs (1 Bottle total) by mouth daily as needed for severe constipation. 05/29/21  Yes Sharman Cheek, MD  methocarbamol (ROBAXIN) 500 MG tablet Take 1 tablet (500 mg total) by mouth 3 (three) times daily. 06/05/19   Evon Slack, PA-C  olopatadine (PATANOL) 0.1 % ophthalmic solution  Place 1 drop into both eyes 2 (two) times daily 05/20/21     omeprazole (PRILOSEC) 20 MG capsule Take by mouth. 06/04/17 06/04/18  [provider]  omeprazole (PRILOSEC) 20 MG capsule TAKE 1 CAPSULE BY MOUTH TWICE DAILY 05/28/20 05/28/21  Barbette Reichmann, MD  omeprazole (PRILOSEC) 20 MG capsule Take 1 capsule (20 mg total) by mouth 2 (two) times daily 01/21/21     polyethylene glycol (MIRALAX) 17 g packet Take 17 g  by mouth daily for 7 days. 05/22/21 05/29/21  Orvil Feil, PA-C  predniSONE (DELTASONE) 10 MG tablet 10 day taper. 5,5,4,4,3,3,2,2,1,1 06/05/19   Evon Slack, PA-C  predniSONE (DELTASONE) 10 MG tablet Take 4 tablets daily x 2 days, then 3 tablets daily x 2 days, then 2 tablets daily x 2 days, then 1 tablet daily x 2 days. 12/21/20     SYMBICORT 160-4.5 MCG/ACT inhaler Inhale 2 puffs into the lungs 2 (two) times daily. 08/21/16   [provider]  tobramycin (TOBREX) 0.3 % ophthalmic solution Place 2 drops into the left eye every 4 (four) hours. 06/28/18   Tommi Rumps, PA-C     Allergies Amoxicillin and Penicillins   History reviewed. No pertinent family history.  Social History Social History   Tobacco Use   Smoking status: Former   Smokeless tobacco: Never  Building services engineer Use: Never used  Substance Use Topics   Alcohol use: No    Alcohol/week: 0.0 standard drinks   Drug use: No    Review of Systems  Constitutional:   No fever or chills.  ENT:   No sore throat. No rhinorrhea. Cardiovascular:   No chest pain or syncope. Respiratory:   No dyspnea or cough. Gastrointestinal:   Positive as above for abdominal discomfort and constipation Musculoskeletal:   Negative for focal pain or swelling All other systems reviewed and are negative except as documented above in ROS and HPI.  ____________________________________________   PHYSICAL EXAM:  VITAL SIGNS: ED Triage Vitals [05/29/21 1839]  Enc Vitals Group     BP (!) 138/93     Pulse Rate 90     Resp 18     Temp 98.2 F (36.8 C)     Temp Source Oral     SpO2 97 %     Weight 213 lb 13.5 oz (97 kg)     Height 6\' 1"  (1.854 m)     Head Circumference      Peak Flow      Pain Score 0     Pain Loc      Pain Edu?      Excl. in GC?     Vital signs reviewed, nursing assessments reviewed.   Constitutional:   Alert and oriented. Non-toxic appearance. Eyes:   Conjunctivae are normal. EOMI. ENT       Head:   Normocephalic and atraumatic.           Neck:   No meningismus. Full ROM.  Cardiovascular:   RRR. Cap refill less than 2 seconds. Respiratory:   Normal respiratory effort without tachypnea/retractions.  Gastrointestinal:   Soft and nontender. Non distended. There is no CVA tenderness.  No rebound, rigidity, or guarding.  Rectal exam reveals empty rectum.  No hemorrhoids.  No masses Musculoskeletal:   Normal range of motion in all extremities.  No edema. Neurologic:   Normal speech and language.  Motor grossly intact. No acute focal neurologic deficits are appreciated.  Skin:  Skin is warm, dry and intact. No rash noted.  No wounds.  ____________________________________________    LABS (pertinent positives/negatives) (all labs ordered are listed, but only abnormal results are displayed) Labs Reviewed - No data to display ____________________________________________   EKG  ____________________________________________    RADIOLOGY  DG Abdomen 1 View  Result Date: 05/29/2021 CLINICAL DATA:  Constipation EXAM: ABDOMEN - 1 VIEW COMPARISON:  None. FINDINGS: The bowel gas pattern is normal. No radio-opaque calculi or other significant radiographic abnormality are seen. IMPRESSION: Negative. Electronically Signed   By: Tish Frederickson M.D.   On: 05/29/2021 19:09    ____________________________________________   PROCEDURES Procedures  ____________________________________________  CLINICAL IMPRESSION / ASSESSMENT AND PLAN / ED COURSE  Pertinent labs & imaging results that were available during my care of the patient were reviewed by me and considered in my medical decision making (see chart for details).  Daevon Holdren was evaluated in Emergency Department on 05/29/2021 for the symptoms described in the history of present illness. He was evaluated in the context of the global COVID-19 pandemic, which necessitated consideration that the patient might be at risk  for infection with the SARS-CoV-2 virus that causes COVID-19. Institutional protocols and algorithms that pertain to the evaluation of patients at risk for COVID-19 are in a state of rapid change based on information released by regulatory bodies including the CDC and federal and state organizations. These policies and algorithms were followed during the patient's care in the ED.   Patient presents with constipation.  X-ray viewed and interpreted by me which appears normal.  Radiology report reviewed.  Vital signs are normal, exam is benign and reassuring.   Considering the patient's symptoms, medical history, and physical examination today, I have low suspicion for cholecystitis or biliary pathology, pancreatitis, perforation or bowel obstruction, hernia, intra-abdominal abscess, AAA or dissection, volvulus or intussusception, mesenteric ischemia, or appendicitis.   Patient is already tried [place] and high-fiber diet.  We will add on Dulcolax and magnesium citrate.      ____________________________________________   FINAL CLINICAL IMPRESSION(S) / ED DIAGNOSES    Final diagnoses:  Constipation, unspecified constipation type     ED Discharge Orders          Ordered    bisacodyl (DULCOLAX) 10 MG suppository  Every 6 hours PRN        05/29/21 2030    magnesium citrate SOLN  Daily PRN        05/29/21 2030            Portions of this note were generated with dragon dictation software. Dictation errors may occur despite best attempts at proofreading.   Sharman Cheek, MD 05/29/21 2032

## 2021-05-29 NOTE — ED Triage Notes (Signed)
Pt comes pov with constipation. States hasn't had a normal BM in about a week. Has been taking miralax.

## 2021-05-29 NOTE — ED Provider Notes (Signed)
Emergency Medicine Provider Triage Evaluation Note  Spencer Tanner, a 45 y.o. male  was evaluated in triage.  Pt complains of constipation and straining to stool. He reports his last "normal" BM was a week ago. Since then, he has only been able to pass small, firm stool balls. He denies FCS, NV, fecal incontinence, or rectal bleeding. He has been taking Miralax twice daily for the last week or so.   Review of Systems  Positive: Constipation  Negative: ABD pain, NV  Physical Exam  There were no vitals taken for this visit. Gen:   Awake, no distress  NAD Resp:  Normal effort CTA MSK:   Moves extremities without difficulty  Other:  ABD: soft, nontender  Medical Decision Making  Medically screening exam initiated at 6:38 PM.  Appropriate orders placed.  Spencer Tanner was informed that the remainder of the evaluation will be completed by another provider, this initial triage assessment does not replace that evaluation, and the importance of remaining in the ED until their evaluation is complete.  Patient with ED evaluation of constipation.    Lissa Hoard, PA-C 05/29/21 1843    Sharman Cheek, MD 05/29/21 2035    Sharman Cheek, MD 05/29/21 2036

## 2021-05-29 NOTE — Discharge Instructions (Signed)
Please take 1 full aspirin and 1 Cardizem a day.  Follow-up with cardiology within the next week.  Return to the emergency room for chest pain or shortness of breath or if you feel like you are going to pass out.

## 2021-05-29 NOTE — ED Notes (Signed)
Dr. Veronese at the bedside ?

## 2021-05-30 ENCOUNTER — Other Ambulatory Visit: Payer: Self-pay

## 2021-05-30 DIAGNOSIS — F411 Generalized anxiety disorder: Secondary | ICD-10-CM | POA: Diagnosis not present

## 2021-05-30 DIAGNOSIS — K219 Gastro-esophageal reflux disease without esophagitis: Secondary | ICD-10-CM | POA: Diagnosis not present

## 2021-05-30 DIAGNOSIS — I480 Paroxysmal atrial fibrillation: Secondary | ICD-10-CM | POA: Diagnosis not present

## 2021-05-30 DIAGNOSIS — J4520 Mild intermittent asthma, uncomplicated: Secondary | ICD-10-CM | POA: Diagnosis not present

## 2021-05-30 MED ORDER — DILTIAZEM HCL ER COATED BEADS 180 MG PO CP24
ORAL_CAPSULE | ORAL | 1 refills | Status: DC
  Filled 2021-05-30: qty 90, 90d supply, fill #0

## 2021-05-31 ENCOUNTER — Ambulatory Visit (INDEPENDENT_AMBULATORY_CARE_PROVIDER_SITE_OTHER): Payer: 59

## 2021-05-31 ENCOUNTER — Other Ambulatory Visit: Payer: Self-pay

## 2021-05-31 ENCOUNTER — Encounter: Payer: Self-pay | Admitting: Cardiology

## 2021-05-31 ENCOUNTER — Ambulatory Visit (INDEPENDENT_AMBULATORY_CARE_PROVIDER_SITE_OTHER): Payer: 59 | Admitting: Cardiology

## 2021-05-31 VITALS — BP 116/74 | HR 75 | Ht 73.0 in | Wt 205.0 lb

## 2021-05-31 DIAGNOSIS — I480 Paroxysmal atrial fibrillation: Secondary | ICD-10-CM

## 2021-05-31 DIAGNOSIS — R072 Precordial pain: Secondary | ICD-10-CM | POA: Diagnosis not present

## 2021-05-31 MED ORDER — METOPROLOL TARTRATE 100 MG PO TABS
100.0000 mg | ORAL_TABLET | Freq: Once | ORAL | 0 refills | Status: DC
  Filled 2021-05-31: qty 1, 1d supply, fill #0

## 2021-05-31 MED ORDER — IVABRADINE HCL 5 MG PO TABS: 10.0000 mg | ORAL_TABLET | Freq: Once | ORAL | 0 refills | Status: AC

## 2021-05-31 NOTE — Patient Instructions (Signed)
Medication Instructions:  Your physician recommends that you continue on your current medications as directed. Please refer to the Current Medication list given to you today.  *If you need a refill on your cardiac medications before your next appointment, please call your pharmacy*   Lab Work: None ordered If you have labs (blood work) drawn today and your tests are completely normal, you will receive your results only by: MyChart Message (if you have MyChart) OR A paper copy in the mail If you have any lab test that is abnormal or we need to change your treatment, we will call you to review the results.   Testing/Procedures:   Your physician has requested that you have an echocardiogram. Echocardiography is a painless test that uses sound waves to create images of your heart. It provides your doctor with information about the size and shape of your heart and how well your heart's chambers and valves are working. This procedure takes approximately one hour. There are no restrictions for this procedure.    2.  Your physician has requested that you have cardiac CT. Cardiac computed tomography (CT) is a painless test that uses an x-ray machine to take clear, detailed pictures of your heart.  Your cardiac CT will be scheduled at:   [facility]      Thursday 06/13/21 at 0900 [address] Professional [address] [place], [place] [address] [phone]  Please arrive 15 mins early for check-in and test prep.    Please follow these instructions carefully (unless otherwise directed):    On the Night Before the Test: Be sure to Drink plenty of water. Do not consume any caffeinated/decaffeinated beverages or chocolate 12 hours prior to your test.    On the Day of the Test: Drink plenty of water until 1 hour prior to the test. Do not eat any food 4 hours prior to the test. You may take your regular medications prior to the test.  Take metoprolol (Lopressor)  100 MG two hours prior to test. Take Ivabradine (Corlanor) 10 MG two hours prior to test.     After the Test: Drink plenty of water. After receiving IV contrast, you may experience a mild flushed feeling. This is normal. On occasion, you may experience a mild rash up to 24 hours after the test. This is not dangerous. If this occurs, you can take Benadryl 25 mg and increase your fluid intake. If you experience trouble breathing, this can be serious. If it is severe call 911 IMMEDIATELY. If it is mild, please call our office. If you take any of these medications: Glipizide/Metformin, Avandament, Glucavance, please do not take 48 hours after completing test unless otherwise instructed.  Please allow 2-4 weeks for scheduling of routine cardiac CTs. Some insurance companies require a pre-authorization which may delay scheduling of this test.   For non-scheduling related questions, please contact the cardiac imaging nurse navigator should you have any questions/concerns: Rockwell Alexandria, Cardiac Imaging Nurse Navigator Larey Brick, Cardiac Imaging Nurse Navigator [facility] Direct Office Dial: [phone]   For scheduling needs, including cancellations and rescheduling, please call [place], [phone].    3.  Your physician has recommended that you wear a Zio monitor.   This monitor is a medical device that records the heart's electrical activity. Doctors most often use these monitors to diagnose arrhythmias. Arrhythmias are problems with the speed or rhythm of the heartbeat. The monitor is a small device applied to your chest. You can  wear one while you do your normal daily activities. While wearing this monitor if you have any symptoms to push the button and record what you felt. Once you have worn this monitor for the period of time provider prescribed (Usually 14 days), you will return the monitor device in the postage paid box. Once it is returned they will  download the data collected and provide [place] with a report which the provider will then review and we will call you with those results. Important tips:  Avoid showering during the first 24 hours of wearing the monitor. Avoid excessive sweating to help maximize wear time. Do not submerge the device, no hot tubs, and no swimming pools. Keep any lotions or oils away from the patch. After 24 hours you may shower with the patch on. Take brief showers with your back facing the shower head.  Do not remove patch once it has been placed because that will interrupt data and decrease adhesive wear time. Push the button when you have any symptoms and write down what you were feeling. Once you have completed wearing your monitor, remove and place into box which has postage paid and place in your outgoing mailbox.  If for some reason you have misplaced your box then call our office and we can provide another box and/or mail it off for you.     Follow-Up: At [facility], you and your health needs are our priority.  As part of our continuing mission to provide you with exceptional heart care, we have created designated Provider Care Teams.  These Care Teams include your primary Cardiologist (physician) and Advanced Practice Providers (APPs -  Physician Assistants and Nurse Practitioners) who all work together to provide you with the care you need, when you need it.  We recommend signing up for the patient portal called "MyChart".  Sign up information is provided on this After Visit Summary.  MyChart is used to connect with patients for Virtual Visits (Telemedicine).  Patients are able to view lab/test results, encounter notes, upcoming appointments, etc.  Non-urgent messages can be sent to your provider as well.   To learn more about what you can do with MyChart, go to [email or web address].    Your next appointment:    6 weeks  The format for your next appointment:   In Person  Provider:   Debbe Odea, MD  ONLY   Other Instructions

## 2021-05-31 NOTE — Progress Notes (Signed)
Cardiology Office Note:    Date:  05/31/2021   ID:  Spencer Tanner, DOB [birth year], MRN [id number]  PCP:  Spencer Reichmann, MD   [facility] Providers Cardiologist:  None     Referring MD: Spencer Reichmann, MD   Chief Complaint  Patient presents with   New Patient (Initial Visit)    ED follow up -- Afib RVR. Meds reviewed verbally with patient.    Spencer Tanner is a 45 y.o. male who is being seen today for the evaluation of atrial fibrillation at the request of Spencer Reichmann, MD.   History of Present Illness:    Spencer Tanner is a 45 y.o. male with a hx of asthma who presents due to A. fib.  Patient was seen in the ED 05/29/2021 due to chest pain.  Symptoms of chest pain have been ongoing over the past 6 months.  They are sometimes associated with exertion.  Chest pain symptoms are usually associated with palpitations.  Denies elevated heart rates when not having chest pain.  He works at [employer], and his job involves lifting heavy bags.  While in the ED EKG showed atrial fibrillation with RVR.  He was given IV Cardizem and started on p.o. Cardizem upon discharge.  Given aspirin only due to low CHA2DS2-VASc score of 0.  He states feeling different since starting Cardizem, feels weaker.  Past Medical History:  Diagnosis Date   Asthma     History reviewed. No pertinent surgical history.  Current Medications: Current Meds  Medication Sig   albuterol (VENTOLIN HFA) 108 (90 Base) MCG/ACT inhaler INHALE 2 PUFFS BY MOUTH EVERY 6 HOURS AS NEEDED FOR WHEEZING   aspirin EC 325 MG tablet Take 1 tablet (325 mg total) by mouth daily.   bisacodyl (DULCOLAX) 10 MG suppository Place 1 suppository (10 mg total) rectally every 6 (six) hours as needed for up to 3 days for moderate constipation.   budesonide-formoterol (SYMBICORT) 160-4.5 MCG/ACT inhaler INHALE 2 PUFFS BY MOUTH 2 TIMES DAILY   diltiazem (CARDIZEM CD) 180 MG 24 hr capsule Take 1  capsule (180 mg total) by mouth daily.   ivabradine (CORLANOR) 5 MG TABS tablet Take 2 tablets (10 mg total) by mouth once for 1 dose. Take 2 hours prior to CT scan   magnesium citrate SOLN Take 296 mLs (1 Bottle total) by mouth daily as needed for severe constipation.   metoprolol tartrate (LOPRESSOR) 100 MG tablet Take 1 tablet (100 mg total) by mouth once for 1 dose. Take 2 hours prior to your CT scan.   olopatadine (PATANOL) 0.1 % ophthalmic solution Place 1 drop into both eyes 2 (two) times daily   tobramycin (TOBREX) 0.3 % ophthalmic solution Place 2 drops into the left eye every 4 (four) hours.   [DISCONTINUED] omeprazole (PRILOSEC) 20 MG capsule TAKE 1 CAPSULE BY MOUTH TWICE DAILY     Allergies:   Amoxicillin and Penicillins   Social History   Socioeconomic History   Marital status: Single    Spouse name: Not on file   Number of children: Not on file   Years of education: Not on file   Highest education level: Not on file  Occupational History   Not on file  Tobacco Use   Smoking status: Former   Smokeless tobacco: Never  Vaping Use   Vaping Use: Never used  Substance and Sexual Activity   Alcohol use: No    Alcohol/week: 0.0 standard drinks   Drug use: No   Sexual activity:  Not on file  Other Topics Concern   Not on file  Social History Narrative   Not on file   Social Determinants of Health   Financial Resource Strain: Not on file  Food Insecurity: Not on file  Transportation Needs: Not on file  Physical Activity: Not on file  Stress: Not on file  Social Connections: Not on file     Family History: The patient's family history is not on file.  ROS:   Please see the history of present illness.     All other systems reviewed and are negative.  EKGs/Labs/Other Studies Reviewed:    The following studies were reviewed today:   EKG:  EKG is  ordered today.  The ekg ordered today demonstrates normal sinus rhythm, ECG.  Recent Labs: 05/23/2021: ALT  69 05/28/2021: BUN 13; Creatinine, Ser 1.23; Hemoglobin 16.9; Platelets 322; Potassium 4.4; Sodium 137 05/29/2021: Magnesium 2.4; TSH 2.111  Recent Lipid Panel No results found for: CHOL, TRIG, HDL, CHOLHDL, VLDL, LDLCALC, LDLDIRECT   Risk Assessment/Calculations:          Physical Exam:    VS:  BP 116/74 (BP Location: Left Arm, Patient Position: Sitting, Cuff Size: Large)   Pulse 75   Ht 6\' 1"  (1.854 m)   Wt 205 lb (93 kg)   SpO2 96%   BMI 27.05 kg/m     Wt Readings from Last 3 Encounters:  05/31/21 205 lb (93 kg)  05/29/21 213 lb 13.5 oz (97 kg)  05/28/21 214 lb (97.1 kg)     GEN:  Well nourished, well developed in no acute distress HEENT: Normal NECK: No JVD; No carotid bruits LYMPHATICS: No lymphadenopathy CARDIAC: RRR, no murmurs, rubs, gallops RESPIRATORY:  Clear to auscultation without rales, wheezing or rhonchi  ABDOMEN: Soft, non-tender, non-distended MUSCULOSKELETAL:  No edema; No deformity  SKIN: Warm and dry NEUROLOGIC:  Alert and oriented x 3 PSYCHIATRIC:  Normal affect   ASSESSMENT:    1. Paroxysmal atrial fibrillation (HCC)   2. Precordial pain    PLAN:    In order of problems listed above:  Paroxysmal atrial fibrillation, CHA2DS2-VASc score of 0.  Continue diltiazem 180, aspirin okay.  Get echocardiogram.  Place cardiac monitor to evaluate A. fib burden. Chest pain, former smoker.  Get echo and coronary CTA to evaluate presence of CAD.  Follow-up after cardiac testing.     Medication Adjustments/Labs and Tests Ordered: Current medicines are reviewed at length with the patient today.  Concerns regarding medicines are outlined above.  Orders Placed This Encounter  Procedures   CT CORONARY MORPH W/CTA COR W/SCORE W/CA W/CM &/OR WO/CM   LONG TERM MONITOR (3-14 DAYS)   EKG 12-Lead   ECHOCARDIOGRAM COMPLETE    Meds ordered this encounter  Medications   metoprolol tartrate (LOPRESSOR) 100 MG tablet    Sig: Take 1 tablet (100 mg total) by  mouth once for 1 dose. Take 2 hours prior to your CT scan.    Dispense:  1 tablet    Refill:  0   ivabradine (CORLANOR) 5 MG TABS tablet    Sig: Take 2 tablets (10 mg total) by mouth once for 1 dose. Take 2 hours prior to CT scan    Dispense:  2 tablet    Refill:  0     Patient Instructions  Medication Instructions:  Your physician recommends that you continue on your current medications as directed. Please refer to the Current Medication list given to you today.  *If you  need a refill on your cardiac medications before your next appointment, please call your pharmacy*   Lab Work: None ordered If you have labs (blood work) drawn today and your tests are completely normal, you will receive your results only by: MyChart Message (if you have MyChart) OR A paper copy in the mail If you have any lab test that is abnormal or we need to change your treatment, we will call you to review the results.   Testing/Procedures:   Your physician has requested that you have an echocardiogram. Echocardiography is a painless test that uses sound waves to create images of your heart. It provides your doctor with information about the size and shape of your heart and how well your heart's chambers and valves are working. This procedure takes approximately one hour. There are no restrictions for this procedure.    2.  Your physician has requested that you have cardiac CT. Cardiac computed tomography (CT) is a painless test that uses an x-ray machine to take clear, detailed pictures of your heart.  Your cardiac CT will be scheduled at:   [facility]      Thursday 06/13/21 at 0900 [address] Professional [address] [place], [place] [address] [phone]  Please arrive 15 mins early for check-in and test prep.    Please follow these instructions carefully (unless otherwise directed):    On the Night Before the Test: Be sure to Drink plenty of water. Do not consume  any caffeinated/decaffeinated beverages or chocolate 12 hours prior to your test.    On the Day of the Test: Drink plenty of water until 1 hour prior to the test. Do not eat any food 4 hours prior to the test. You may take your regular medications prior to the test.  Take metoprolol (Lopressor) 100 MG two hours prior to test. Take Ivabradine (Corlanor) 10 MG two hours prior to test.     After the Test: Drink plenty of water. After receiving IV contrast, you may experience a mild flushed feeling. This is normal. On occasion, you may experience a mild rash up to 24 hours after the test. This is not dangerous. If this occurs, you can take Benadryl 25 mg and increase your fluid intake. If you experience trouble breathing, this can be serious. If it is severe call 911 IMMEDIATELY. If it is mild, please call our office. If you take any of these medications: Glipizide/Metformin, Avandament, Glucavance, please do not take 48 hours after completing test unless otherwise instructed.  Please allow 2-4 weeks for scheduling of routine cardiac CTs. Some insurance companies require a pre-authorization which may delay scheduling of this test.   For non-scheduling related questions, please contact the cardiac imaging nurse navigator should you have any questions/concerns: Rockwell Alexandria, Cardiac Imaging Nurse Navigator Larey Brick, Cardiac Imaging Nurse Navigator [place] [facility] Direct Office Dial: [phone]   For scheduling needs, including cancellations and rescheduling, please call [place], [phone].    3.  Your physician has recommended that you wear a Zio monitor.   This monitor is a medical device that records the heart's electrical activity. Doctors most often use these monitors to diagnose arrhythmias. Arrhythmias are problems with the speed or rhythm of the heartbeat. The monitor is a small device applied to your chest. You can wear one while you do your  normal daily activities. While wearing this monitor if you have any symptoms to push the button and record what you felt. Once  you have worn this monitor for the period of time provider prescribed (Usually 14 days), you will return the monitor device in the postage paid box. Once it is returned they will download the data collected and provide [place] with a report which the provider will then review and we will call you with those results. Important tips:  Avoid showering during the first 24 hours of wearing the monitor. Avoid excessive sweating to help maximize wear time. Do not submerge the device, no hot tubs, and no swimming pools. Keep any lotions or oils away from the patch. After 24 hours you may shower with the patch on. Take brief showers with your back facing the shower head.  Do not remove patch once it has been placed because that will interrupt data and decrease adhesive wear time. Push the button when you have any symptoms and write down what you were feeling. Once you have completed wearing your monitor, remove and place into box which has postage paid and place in your outgoing mailbox.  If for some reason you have misplaced your box then call our office and we can provide another box and/or mail it off for you.     Follow-Up: At [facility], you and your health needs are our priority.  As part of our continuing mission to provide you with exceptional heart care, we have created designated Provider Care Teams.  These Care Teams include your primary Cardiologist (physician) and Advanced Practice Providers (APPs -  Physician Assistants and Nurse Practitioners) who all work together to provide you with the care you need, when you need it.  We recommend signing up for the patient portal called "MyChart".  Sign up information is provided on this After Visit Summary.  MyChart is used to connect with patients for Virtual Visits (Telemedicine).  Patients are able to view lab/test results,  encounter notes, upcoming appointments, etc.  Non-urgent messages can be sent to your provider as well.   To learn more about what you can do with MyChart, go to [email or web address].    Your next appointment:    6 weeks  The format for your next appointment:   In Person  Provider:   Debbe Odea, MD  ONLY   Other Instructions       Signed, Debbe Odea, MD  05/31/2021 12:23 PM    [facility]

## 2021-06-01 ENCOUNTER — Other Ambulatory Visit: Payer: Self-pay

## 2021-06-01 ENCOUNTER — Encounter: Payer: Self-pay | Admitting: Emergency Medicine

## 2021-06-01 ENCOUNTER — Emergency Department
Admission: EM | Admit: 2021-06-01 | Discharge: 2021-06-01 | Disposition: A | Discharge status: Home / Self Care | Payer: 59 | Attending: Emergency Medicine | Admitting: Emergency Medicine

## 2021-06-01 DIAGNOSIS — R030 Elevated blood-pressure reading, without diagnosis of hypertension: Secondary | ICD-10-CM | POA: Diagnosis present

## 2021-06-01 DIAGNOSIS — Z7951 Long term (current) use of inhaled steroids: Secondary | ICD-10-CM | POA: Insufficient documentation

## 2021-06-01 DIAGNOSIS — Z79899 Other long term (current) drug therapy: Secondary | ICD-10-CM | POA: Insufficient documentation

## 2021-06-01 DIAGNOSIS — Z87891 Personal history of nicotine dependence: Secondary | ICD-10-CM | POA: Insufficient documentation

## 2021-06-01 DIAGNOSIS — I10 Essential (primary) hypertension: Secondary | ICD-10-CM | POA: Diagnosis not present

## 2021-06-01 DIAGNOSIS — Z7982 Long term (current) use of aspirin: Secondary | ICD-10-CM | POA: Diagnosis not present

## 2021-06-01 DIAGNOSIS — J45909 Unspecified asthma, uncomplicated: Secondary | ICD-10-CM | POA: Diagnosis not present

## 2021-06-01 HISTORY — DX: Essential (primary) hypertension: I10

## 2021-06-01 NOTE — ED Provider Notes (Signed)
[facility] Emergency Department Provider Note  ____________________________________________   Event Date/Time   First MD Initiated Contact with Patient 06/01/21 [phone]     (approximate)  I have reviewed the triage vital signs and the nursing notes.   HISTORY  Chief Complaint wants bp checked    HPI Spencer Tanner is a 45 y.o. male patient request BP check.  Patient stated recently started on blood pressure medication and wanted no if it is effective.  Denies headache, vision disturbance, vertigo, or weakness.  Started low pressure 100 mg daily yesterday.  Took medication approximately an hour and a half prior to arrival.      Past Medical History:  Diagnosis Date   Asthma    Hypertension     There are no problems to display for this patient.   History reviewed. No pertinent surgical history.  Prior to Admission medications   Medication Sig Start Date End Date Taking? Authorizing Provider  albuterol (VENTOLIN HFA) 108 (90 Base) MCG/ACT inhaler INHALE 2 PUFFS BY MOUTH EVERY 6 HOURS AS NEEDED FOR WHEEZING 12/03/20     aspirin EC 325 MG tablet Take 1 tablet (325 mg total) by mouth daily. 05/29/21 05/29/22  Nita Sickle, MD  bisacodyl (DULCOLAX) 10 MG suppository Place 1 suppository (10 mg total) rectally every 6 (six) hours as needed for up to 3 days for moderate constipation. 05/29/21 06/01/21  Sharman Cheek, MD  budesonide-formoterol [facility]) 160-4.5 MCG/ACT inhaler INHALE 2 PUFFS BY MOUTH 2 TIMES DAILY 12/20/20 12/20/21    diltiazem (CARDIZEM CD) 180 MG 24 hr capsule Take 1 capsule (180 mg total) by mouth daily. 05/29/21 05/29/22  Nita Sickle, MD  magnesium citrate SOLN Take 296 mLs (1 Bottle total) by mouth daily as needed for severe constipation. 05/29/21   Sharman Cheek, MD  metoprolol tartrate (LOPRESSOR) 100 MG tablet Take 1 tablet (100 mg total) by mouth once for 1 dose. Take 2 hours prior to your CT scan. 05/31/21  06/01/21  Debbe Odea, MD  olopatadine (PATANOL) 0.1 % ophthalmic solution Place 1 drop into both eyes 2 (two) times daily 05/20/21     tobramycin (TOBREX) 0.3 % ophthalmic solution Place 2 drops into the left eye every 4 (four) hours. 06/28/18   Tommi Rumps, PA-C    Allergies Amoxicillin and Penicillins  History reviewed. No pertinent family history.  Social History Social History   Tobacco Use   Smoking status: Former   Smokeless tobacco: Never  Building services engineer Use: Never used  Substance Use Topics   Alcohol use: No    Alcohol/week: 0.0 standard drinks   Drug use: No    Review of Systems  Constitutional: No fever/chills Eyes: No visual changes. ENT: No sore throat. Cardiovascular: Denies chest pain.   Respiratory: Denies shortness of breath. Gastrointestinal: No abdominal pain.  No nausea, no vomiting.  No diarrhea.  No constipation. Genitourinary: Negative for dysuria. Musculoskeletal: Negative for back pain. Skin: Negative for rash. Neurological: Negative for headaches, focal weakness or numbness. Endocrine: Hypertension Allergic/Immunilogical: Penicillin ____________________________________________   PHYSICAL EXAM:  VITAL SIGNS: ED Triage Vitals  Enc Vitals Group     BP 06/01/21 0642 (!) 148/92     Pulse Rate 06/01/21 0642 78     Resp 06/01/21 0642 20     Temp 06/01/21 0642 98.6 F (37 C)     Temp Source 06/01/21 0642 Tympanic     SpO2 06/01/21 0642 99 %     Weight 06/01/21 0642 204  lb (92.5 kg)     Height 06/01/21 0642 6\' 1"  (1.854 m)     Head Circumference --      Peak Flow --      Pain Score 06/01/21 0645 0     Pain Loc --      Pain Edu? --      Excl. in GC? --     Constitutional: Alert and oriented. Well appearing and in no acute distress.  Appears anxious Eyes: Conjunctivae are normal. PERRL. EOMI. Head: Atraumatic. Nose: No congestion/rhinnorhea. Mouth/Throat: Mucous membranes are moist.  Oropharynx non-erythematous. Neck:  No stridor.  No cervical spine tenderness to palpation. Hematological/Lymphatic/Immunilogical: No cervical lymphadenopathy. Cardiovascular: Normal rate, regular rhythm. Grossly normal heart sounds.  Good peripheral circulation.  Initial blood pressure was 148/92 retaken in treatment room is now 127/70. Respiratory: Normal respiratory effort.  No retractions. Lungs CTAB. Gastrointestinal: Soft and nontender. No distention. No abdominal bruits. No CVA tenderness.  Musculoskeletal: No lower extremity tenderness nor edema.  No joint effusions. Neurologic:  Normal speech and language. No gross focal neurologic deficits are appreciated. No gait instability. Skin:  Skin is warm, dry and intact. No rash noted. Psychiatric: Mood and affect are normal. Speech and behavior are normal.  ____________________________________________   LABS (all labs ordered are listed, but only abnormal results are displayed)  Labs Reviewed - No data to display ____________________________________________  EKG   ____________________________________________  RADIOLOGY I, 06/03/21, personally viewed and evaluated these images (plain radiographs) as part of my medical decision making, as well as reviewing the written report by the radiologist.  ED MD interpretation:    Official radiology report(s): No results found.  ____________________________________________   PROCEDURES  Procedure(s) performed (including Critical Care):  Procedures   ____________________________________________   INITIAL IMPRESSION / ASSESSMENT AND PLAN / ED COURSE  As part of my medical decision making, I reviewed the following data within the electronic MEDICAL RECORD NUMBER         Patient here for blood pressure check.  Patient believes nurse blood pressure in normal range.  Advised continue previous medication follow-up PCP.      ____________________________________________   FINAL CLINICAL IMPRESSION(S) / ED  DIAGNOSES  Final diagnoses:  Hypertension, unspecified type     ED Discharge Orders     None        Note:  This document was prepared using Dragon voice recognition software and may include unintentional dictation errors.    Joni Reining, PA-C 06/01/21 0745    06/03/21, MD 06/06/21 [phone]

## 2021-06-01 NOTE — ED Notes (Signed)
Pt denies any medical complaints. Pt reports ever since being diagnosed with Afib, he wants "every little thing checked." Pt A&O X4, respirations even and unlabored, no s/s of distress noted. Pt ambulates with steady gait.

## 2021-06-01 NOTE — Discharge Instructions (Addendum)
Your blood pressure seems to be controlled with your medications.  Follow-up with family doctor.  Advised to purchase a home blood pressure unit.

## 2021-06-01 NOTE — ED Triage Notes (Signed)
Pt took his bp meds and wanted to come to hospital and get bp checked to see if high.  Pt asking RN if his bp is high or low because if OK he is going to leave. Informed pt RN unable to advise pt on what to do.  No pain. Denies any sx r/t bp.

## 2021-06-03 ENCOUNTER — Ambulatory Visit: Payer: 59

## 2021-06-03 ENCOUNTER — Other Ambulatory Visit: Payer: Self-pay

## 2021-06-04 ENCOUNTER — Other Ambulatory Visit: Payer: Self-pay

## 2021-06-04 MED ORDER — ALBUTEROL SULFATE HFA 108 (90 BASE) MCG/ACT IN AERS
2.0000 | INHALATION_SPRAY | Freq: Four times a day (QID) | RESPIRATORY_TRACT | 5 refills | Status: DC
  Filled 2021-06-04: qty 18, 25d supply, fill #0
  Filled 2021-06-21: qty 18, 25d supply, fill #1
  Filled 2021-07-25: qty 18, 25d supply, fill #2
  Filled 2021-08-26: qty 18, 25d supply, fill #3
  Filled 2021-10-16: qty 18, 25d supply, fill #4
  Filled 2021-11-29: qty 18, 25d supply, fill #5

## 2021-06-06 ENCOUNTER — Telehealth: Payer: Self-pay | Admitting: Cardiology

## 2021-06-06 NOTE — Telephone Encounter (Signed)
Called patient back and left a VM requesting a call back as there is no DPR on file. Will tell patient that there are no restrictions for him working at this time.

## 2021-06-06 NOTE — Telephone Encounter (Signed)
Called patient back and informed him that that there were no restrictions at this time. He also asked if it would be okay to take Heartburn medication and I informed him that that would be okay.  Patient was very grateful for the call back and confirmed all upcoming testing appointments.

## 2021-06-06 NOTE — Telephone Encounter (Signed)
Patient returning call.

## 2021-06-06 NOTE — Telephone Encounter (Signed)
Patient came by office Would like to know if he is ok to go back to work on Monday or is he to wait til after all testing is completed  Please call to discuss

## 2021-06-07 ENCOUNTER — Telehealth: Payer: Self-pay | Admitting: Cardiology

## 2021-06-07 NOTE — Telephone Encounter (Signed)
Pt c/o medication issue:  1. Name of Medication: sudafed PE  2. How are you currently taking this medication (dosage and times per day)? New for cold   3. Are you having a reaction (difficulty breathing--STAT)? na  4. What is your medication issue? Patient wants to know if safe to take for cold given cardiac meds and issues

## 2021-06-07 NOTE — Telephone Encounter (Signed)
I do not recommend any type of sudafed wether it is phenylephrine or pseudoephedrine due to his uncontrolled BP. He can take afrin nasal spray for 3 day for nasal congestion

## 2021-06-07 NOTE — Telephone Encounter (Signed)
Was able to reach back out to Spencer Tanner regarding medication sudafed PE, advised pharmD advised  "I do not recommend any type of sudafed wether it is phenylephrine or pseudoephedrine due to his uncontrolled BP. He can take afrin nasal spray for 3 day for nasal congestion"  Advised could try Coricidin HBP as long as he read the ingredients to make sure phenylephrine or pseudoephedrine  were NOT active ingredients and try the Afrin spray, if not better reach out to PCP to f/u with his symptoms. Pt verbalized understanding and thankful for returning his call with recommendations.

## 2021-06-12 ENCOUNTER — Telehealth (HOSPITAL_COMMUNITY): Payer: Self-pay | Admitting: Emergency Medicine

## 2021-06-12 NOTE — Telephone Encounter (Signed)
Attempted to call patient regarding upcoming cardiac CT appointment. °Left message on voicemail with name and callback number °Alizay Bronkema RN Navigator Cardiac Imaging °[facility] °[phone] Office °[phone] Cell ° °

## 2021-06-13 ENCOUNTER — Other Ambulatory Visit: Payer: Self-pay

## 2021-06-13 ENCOUNTER — Ambulatory Visit
Admission: RE | Admit: 2021-06-13 | Discharge: 2021-06-13 | Disposition: A | Discharge status: Home / Self Care | Payer: 59 | Source: Ambulatory Visit | Attending: Cardiology | Admitting: Cardiology

## 2021-06-13 DIAGNOSIS — R072 Precordial pain: Secondary | ICD-10-CM | POA: Diagnosis not present

## 2021-06-13 MED ORDER — IOHEXOL 350 MG/ML SOLN
75.0000 mL | Freq: Once | INTRAVENOUS | Status: AC | PRN
  Administered 2021-06-13: 75 mL via INTRAVENOUS

## 2021-06-13 MED ORDER — NITROGLYCERIN 0.4 MG SL SUBL
0.8000 mg | SUBLINGUAL_TABLET | Freq: Once | SUBLINGUAL | Status: AC
  Administered 2021-06-13: 0.8 mg via SUBLINGUAL

## 2021-06-13 MED ORDER — METOPROLOL TARTRATE 5 MG/5ML IV SOLN: 5.0000 mg | INTRAVENOUS | Status: DC | PRN

## 2021-06-13 NOTE — Progress Notes (Signed)
Pt tolerated exam without incident; pt denies lightheadedness or dizziness; pt ambulatory to lobby steady gait noted  

## 2021-06-17 ENCOUNTER — Telehealth: Payer: Self-pay | Admitting: Cardiology

## 2021-06-17 DIAGNOSIS — I480 Paroxysmal atrial fibrillation: Secondary | ICD-10-CM | POA: Diagnosis not present

## 2021-06-17 NOTE — Telephone Encounter (Signed)
Please call with monitor and CT results.

## 2021-06-17 NOTE — Telephone Encounter (Signed)
Spoke with pt.  Discussed coronary CTA results per result note in chart.  Pt voiced understanding and has no further questions.  Pt will proceed with echo scheduled 11/21 and follow up as scheduled 11/28.

## 2021-06-20 ENCOUNTER — Telehealth: Payer: Self-pay | Admitting: Cardiology

## 2021-06-20 NOTE — Telephone Encounter (Signed)
His diltiazem could increase the concentration of clindamycin. Should monitor for side effects of clindamycin. Would use alternative if possible. If no alternative Ok to take for a short course

## 2021-06-20 NOTE — Telephone Encounter (Signed)
Patient calling Would like to know if it is ok to take clindamycin along with his heart medications Please call to discuss

## 2021-06-20 NOTE — Telephone Encounter (Signed)
Called patient and informed him of the response from PharmD as stated below. Patient was prescribed a 10 day course of the Clindamycin. He will reach out to the prescribing doctor to inform him of the interaction with his Diltiazem.   Patient was very grateful for the follow up.

## 2021-06-21 ENCOUNTER — Other Ambulatory Visit: Payer: Self-pay

## 2021-06-21 DIAGNOSIS — J45909 Unspecified asthma, uncomplicated: Secondary | ICD-10-CM | POA: Diagnosis not present

## 2021-06-21 DIAGNOSIS — Z87891 Personal history of nicotine dependence: Secondary | ICD-10-CM | POA: Insufficient documentation

## 2021-06-21 DIAGNOSIS — Z79899 Other long term (current) drug therapy: Secondary | ICD-10-CM | POA: Insufficient documentation

## 2021-06-21 DIAGNOSIS — Z7951 Long term (current) use of inhaled steroids: Secondary | ICD-10-CM | POA: Diagnosis not present

## 2021-06-21 DIAGNOSIS — I10 Essential (primary) hypertension: Secondary | ICD-10-CM | POA: Insufficient documentation

## 2021-06-21 DIAGNOSIS — R109 Unspecified abdominal pain: Secondary | ICD-10-CM | POA: Insufficient documentation

## 2021-06-21 DIAGNOSIS — K5730 Diverticulosis of large intestine without perforation or abscess without bleeding: Secondary | ICD-10-CM | POA: Diagnosis not present

## 2021-06-21 LAB — COMPREHENSIVE METABOLIC PANEL
ALT: 75 U/L — ABNORMAL HIGH (ref 0–44)
AST: 34 U/L (ref 15–41)
Albumin: 4.1 g/dL (ref 3.5–5.0)
Alkaline Phosphatase: 69 U/L (ref 38–126)
Anion gap: 7 (ref 5–15)
BUN: 12 mg/dL (ref 6–20)
CO2: 29 mmol/L (ref 22–32)
Calcium: 9.7 mg/dL (ref 8.9–10.3)
Chloride: 103 mmol/L (ref 98–111)
Creatinine, Ser: 1.16 mg/dL (ref 0.61–1.24)
GFR, Estimated: >60 mL/min (ref >60)
Glucose, Bld: 90 mg/dL (ref 70–99)
Potassium: 4.6 mmol/L (ref 3.5–5.1)
Sodium: 139 mmol/L (ref 135–145)
Total Bilirubin: 0.7 mg/dL (ref 0.3–1.2)
Total Protein: 7.6 g/dL (ref 6.5–8.1)

## 2021-06-21 LAB — URINALYSIS, ROUTINE W REFLEX MICROSCOPIC
Bilirubin Urine: NEGATIVE
Glucose, UA: NEGATIVE mg/dL
Hgb urine dipstick: NEGATIVE
Ketones, ur: NEGATIVE mg/dL
Leukocytes,Ua: NEGATIVE
Nitrite: NEGATIVE
Protein, ur: NEGATIVE mg/dL
Specific Gravity, Urine: 1.009 (ref 1.005–1.030)
pH: 6 (ref 5.0–8.0)

## 2021-06-21 LAB — CBC
HCT: 48 % (ref 39.0–52.0)
Hemoglobin: 16.6 g/dL (ref 13.0–17.0)
MCH: 30 pg (ref 26.0–34.0)
MCHC: 34.6 g/dL (ref 30.0–36.0)
MCV: 86.6 fL (ref 80.0–100.0)
Platelets: 347 10*3/uL (ref 150–400)
RBC: 5.54 MIL/uL (ref 4.22–5.81)
RDW: 13.2 % (ref 11.5–15.5)
WBC: 6.3 10*3/uL (ref 4.0–10.5)
nRBC: 0 % (ref 0.0–0.2)

## 2021-06-21 LAB — LIPASE, BLOOD: Lipase: 34 U/L (ref 11–51)

## 2021-06-21 MED ORDER — OMEPRAZOLE 20 MG PO CPDR
DELAYED_RELEASE_CAPSULE | ORAL | 5 refills | Status: DC
  Filled 2021-06-21: qty 60, 30d supply, fill #0

## 2021-06-21 NOTE — ED Triage Notes (Signed)
Pt presents to ER c/o BIL flank pain and some abd pain.  Pt also states he checked his BP at [employer] and it was elevated and wanted to make sure the two were not related.  Pt states sx started 2 days ago. Pt denies n/v/d, states he has had normal Bms.  Pt A&O x4.  Has hx of afib and takes diltiazem which he has been compliant with.

## 2021-06-22 ENCOUNTER — Emergency Department
Admission: EM | Admit: 2021-06-22 | Discharge: 2021-06-22 | Disposition: A | Discharge status: Home / Self Care | Payer: 59 | Attending: Emergency Medicine | Admitting: Emergency Medicine

## 2021-06-22 ENCOUNTER — Emergency Department: Payer: 59

## 2021-06-22 DIAGNOSIS — R109 Unspecified abdominal pain: Secondary | ICD-10-CM

## 2021-06-22 DIAGNOSIS — J45909 Unspecified asthma, uncomplicated: Secondary | ICD-10-CM | POA: Diagnosis not present

## 2021-06-22 DIAGNOSIS — K5730 Diverticulosis of large intestine without perforation or abscess without bleeding: Secondary | ICD-10-CM | POA: Diagnosis not present

## 2021-06-22 DIAGNOSIS — I10 Essential (primary) hypertension: Secondary | ICD-10-CM

## 2021-06-22 DIAGNOSIS — Z79899 Other long term (current) drug therapy: Secondary | ICD-10-CM | POA: Diagnosis not present

## 2021-06-22 DIAGNOSIS — Z7951 Long term (current) use of inhaled steroids: Secondary | ICD-10-CM | POA: Diagnosis not present

## 2021-06-22 DIAGNOSIS — Z87891 Personal history of nicotine dependence: Secondary | ICD-10-CM | POA: Diagnosis not present

## 2021-06-22 MED ORDER — SUCRALFATE 1 G PO TABS
1.0000 g | ORAL_TABLET | Freq: Once | ORAL | Status: AC
  Administered 2021-06-22: 1 g via ORAL
  Filled 2021-06-22: qty 1

## 2021-06-22 MED ORDER — SUCRALFATE 1 G PO TABS
1.0000 g | ORAL_TABLET | Freq: Four times a day (QID) | ORAL | 0 refills | Status: DC
  Filled 2021-06-22 – 2021-06-24 (×2): qty 20, 5d supply, fill #0

## 2021-06-22 MED ORDER — PANTOPRAZOLE SODIUM 40 MG PO TBEC
40.0000 mg | DELAYED_RELEASE_TABLET | Freq: Once | ORAL | Status: AC
  Administered 2021-06-22: 40 mg via ORAL
  Filled 2021-06-22: qty 1

## 2021-06-22 MED ORDER — ALUM & MAG HYDROXIDE-SIMETH 200-200-20 MG/5ML PO SUSP
30.0000 mL | Freq: Once | ORAL | Status: AC
  Administered 2021-06-22: 30 mL via ORAL
  Filled 2021-06-22: qty 30

## 2021-06-22 NOTE — ED Notes (Signed)
See triage note  presents with bilateral flank pain and some abd pain  denies any fever  no n/v/d

## 2021-06-22 NOTE — ED Provider Notes (Addendum)
[facility] Emergency Department Provider Note  ____________________________________________   Event Date/Time   First MD Initiated Contact with Patient 06/22/21 [phone]     (approximate)  I have reviewed the triage vital signs and the nursing notes.   HISTORY  Chief Complaint Hypertension and Flank Pain   HPI Spencer Tanner is a 45 y.o. male with past medical history of HTN, asthma and A. fib not on anticoagulation but on diltiazem who presents for assessment of 3 days of some intermittent abdominal pain.  No clear alleviating aggravating factors including food or NSAIDs.  No significant EtOH use.  He denies any vomiting, nausea, diarrhea, constipation, burning with urination, blood in stool, blood in his urine, back pain, rash, chest pain, cough, shortness of breath or extremity pain.  No prior similar episodes.  No recent injuries or falls.  He states he is on Protonix for GERD but feels this has not been helping much.  He has no other acute concerns at this time.         Past Medical History:  Diagnosis Date   Asthma    Hypertension     There are no problems to display for this patient.   History reviewed. No pertinent surgical history.  Prior to Admission medications   Medication Sig Start Date End Date Taking? Authorizing Provider  sucralfate (CARAFATE) 1 g tablet Take 1 tablet (1 g total) by mouth 4 (four) times daily for 5 days. 06/22/21 06/27/21 Yes Gilles Chiquito, MD  albuterol (VENTOLIN HFA) 108 (90 Base) MCG/ACT inhaler INHALE 2 PUFFS BY MOUTH EVERY 6 HOURS AS NEEDED FOR WHEEZING 06/04/21     aspirin EC 325 MG tablet Take 1 tablet (325 mg total) by mouth daily. 05/29/21 05/29/22  Nita Sickle, MD  budesonide-formoterol [facility]) 160-4.5 MCG/ACT inhaler INHALE 2 PUFFS BY MOUTH 2 TIMES DAILY 12/20/20 12/20/21    diltiazem (CARDIZEM CD) 180 MG 24 hr capsule Take 1 capsule (180 mg total) by mouth daily. 05/29/21 05/29/22  Nita Sickle, MD  magnesium citrate SOLN Take 296 mLs (1 Bottle total) by mouth daily as needed for severe constipation. 05/29/21   Sharman Cheek, MD  metoprolol tartrate (LOPRESSOR) 100 MG tablet Take 1 tablet (100 mg total) by mouth once for 1 dose. Take 2 hours prior to your CT scan. 05/31/21 06/04/21  Debbe Odea, MD  olopatadine (PATANOL) 0.1 % ophthalmic solution Place 1 drop into both eyes 2 (two) times daily 05/20/21     omeprazole (PRILOSEC) 20 MG capsule Take 1 capsule (20 mg total) by mouth 2 (two) times daily 06/21/21     tobramycin (TOBREX) 0.3 % ophthalmic solution Place 2 drops into the left eye every 4 (four) hours. 06/28/18   Tommi Rumps, PA-C    Allergies Amoxicillin and Penicillins  History reviewed. No pertinent family history.  Social History Social History   Tobacco Use   Smoking status: Former   Smokeless tobacco: Never  Building services engineer Use: Never used  Substance Use Topics   Alcohol use: No    Alcohol/week: 0.0 standard drinks   Drug use: No    Review of Systems  Review of Systems  Constitutional:  Negative for chills and fever.  HENT:  Negative for sore throat.   Eyes:  Negative for pain.  Respiratory:  Negative for cough and stridor.   Cardiovascular:  Negative for chest pain.  Gastrointestinal:  Positive for abdominal pain. Negative for vomiting.  Genitourinary:  Negative for dysuria.  Musculoskeletal:  Negative for myalgias.  Skin:  Negative for rash.  Neurological:  Negative for seizures, loss of consciousness and headaches.  Psychiatric/Behavioral:  Negative for suicidal ideas.   All other systems reviewed and are negative.    ____________________________________________   PHYSICAL EXAM:  VITAL SIGNS: ED Triage Vitals  Enc Vitals Group     BP 06/21/21 2003 (!) 146/88     Pulse Rate 06/21/21 2003 67     Resp 06/21/21 2003 18     Temp 06/21/21 2003 98.5 F (36.9 C)     Temp Source 06/21/21 2003 Oral     SpO2 06/21/21  2003 99 %     Weight 06/21/21 2004 205 lb 0.4 oz (93 kg)     Height 06/22/21 0722 6\' 1"  (1.854 m)     Head Circumference --      Peak Flow --      Pain Score 06/21/21 2004 9     Pain Loc --      Pain Edu? --      Excl. in GC? --    Vitals:   06/22/21 0830 06/22/21 0930  BP: 124/74 (!) 113/56  Pulse: (!) 51 (!) 54  Resp: 18 18  Temp:    SpO2: 100% 95%   Physical Exam Vitals and nursing note reviewed.  Constitutional:      Appearance: He is well-developed.  HENT:     Head: Normocephalic and atraumatic.     Right Ear: External ear normal.     Left Ear: External ear normal.     Nose: Nose normal.  Eyes:     Conjunctiva/sclera: Conjunctivae normal.  Cardiovascular:     Rate and Rhythm: Normal rate and regular rhythm.     Heart sounds: No murmur heard. Pulmonary:     Effort: Pulmonary effort is normal. No respiratory distress.     Breath sounds: Normal breath sounds.  Abdominal:     Palpations: Abdomen is soft.     Tenderness: There is no abdominal tenderness.  Musculoskeletal:     Cervical back: Neck supple.  Skin:    General: Skin is warm and dry.     Capillary Refill: Capillary refill takes less than 2 seconds.  Neurological:     Mental Status: He is alert and oriented to person, place, and time.  Psychiatric:        Mood and Affect: Mood normal.     ____________________________________________   LABS (all labs ordered are listed, but only abnormal results are displayed)  Labs Reviewed  COMPREHENSIVE METABOLIC PANEL - Abnormal; Notable for the following components:      Result Value   ALT 75 (*)    All other components within normal limits  URINALYSIS, ROUTINE W REFLEX MICROSCOPIC - Abnormal; Notable for the following components:   Color, Urine STRAW (*)    APPearance CLEAR (*)    All other components within normal limits  LIPASE, BLOOD  CBC   ____________________________________________  EKG  ECG shows sinus bradycardia with ventricular rate of 59,  immature atrial complexes without evidence of acute ischemia or significant arrhythmia. ____________________________________________  RADIOLOGY  ED MD interpretation: CT abdomen pelvis shows no evidence of kidney stones, cholelithiasis, cholecystitis, pancreatitis, appendicitis, perinephric stranding or other acute process.  There is some mild spondylosis of the thoracolumbar spine and diverticulosis without evidence of diverticulitis.  Official radiology report(s): CT ABDOMEN PELVIS WO CONTRAST  Result Date: 06/22/2021 CLINICAL DATA:  Flank pain, kidney stone suspected EXAM: CT ABDOMEN AND  PELVIS WITHOUT CONTRAST TECHNIQUE: Multidetector CT imaging of the abdomen and pelvis was performed following the standard protocol without IV contrast. COMPARISON:  05/29/2021 abdominal radiograph. 02/10/2021 MRI abdomen. FINDINGS: Lower chest: No significant pulmonary nodules or acute consolidative airspace disease. Hepatobiliary: Normal liver size. No liver mass. Normal gallbladder with no radiopaque cholelithiasis. No biliary ductal dilatation. Pancreas: Normal, with no mass or duct dilation. Spleen: Normal size. No mass. Adrenals/Urinary Tract: Normal adrenals. No renal stones. No hydronephrosis. No contour deforming renal masses. Normal caliber ureters. No ureteral stones. Normal bladder. Stomach/Bowel: Normal non-distended stomach. Normal caliber small bowel with no small bowel wall thickening. Normal appendix. Mild left colonic diverticulosis with no large bowel wall thickening or significant pericolonic fat stranding. Vascular/Lymphatic: Normal caliber abdominal aorta. No pathologically enlarged lymph nodes in the abdomen or pelvis. Reproductive: Normal size prostate. Other: No pneumoperitoneum, ascites or focal fluid collection. Musculoskeletal: No aggressive appearing focal osseous lesions. Mild-to-moderate thoracolumbar spondylosis. IMPRESSION: 1. No acute abnormality. No urolithiasis. No hydronephrosis. 2.  Mild left colonic diverticulosis. Electronically Signed   By: Delbert Phenix M.D.   On: 06/22/2021 09:04    ____________________________________________   PROCEDURES  Procedure(s) performed (including Critical Care):  Procedures   ____________________________________________   INITIAL IMPRESSION / ASSESSMENT AND PLAN / ED COURSE        Patient presents with above-stated history exam for assessment of some intermittent crampy bilateral flank pain over the last couple of days.  On arrival he is afebrile and hemodynamically stable to slight hypertensive.  His abdomen is soft nontender throughout and his no CVA tenderness.  He reportedly has been tolerating p.o. without difficulty and has not had any other associated GI symptoms or chest symptoms.  Differential includes possible kidney stone, cholecystitis, pancreatitis, peptic ulcer disease, GERD, diverticulitis and possible atypical presentation for ACS.  Patient is not anticoagulated has a history of A. fib overall history is much less consistent with mesenteric ischemia or dissection given intermittent nature of pain described with abdomen soft throughout and patient reporting is able to eat and drink without difficulty and having normal bowel movements.  In addition he has no leukocytosis or anemia.  His lipase is not consistent with pancreatitis.  CMP shows no significant electrolyte or metabolic derangements aside from ALT slightly above upper limit of normal at 75.  This is likely not reflect an acute infectious hepatitis.  UA without evidence of blood or signs of infection.  History and EKG not suggestive of atypical presentation for ACS.  CT abdomen pelvis shows no evidence of kidney stones, cholelithiasis, cholecystitis, pancreatitis, appendicitis, perinephric stranding or other acute process.  There is some mild spondylosis of the thoracolumbar spine and diverticulosis without evidence of diverticulitis.  Patient given some Maalox and  Carafate on reassessment stated he was feeling much better.  Given significant improvement in pain with use of medications with otherwise reassuring exam and work-up I have a low suspicion for other immediate life-threatening pathology at this time and I think patient is stable for discharge with close outpatient PCP follow-up.  Will trial short course of Carafate outpatient.  Advised him to continue taking his Protonix.  Discharged in stable condition.  Strict return precautions advised and discussed.       ____________________________________________   FINAL CLINICAL IMPRESSION(S) / ED DIAGNOSES  Final diagnoses:  Hypertension, unspecified type  Abdominal pain, unspecified abdominal location    Medications  alum & mag hydroxide-simeth (MAALOX/MYLANTA) 200-200-20 MG/5ML suspension 30 mL (30 mLs Oral Given 06/22/21 0821)  sucralfate (  CARAFATE) tablet 1 g (1 g Oral Given 06/22/21 0821)  pantoprazole (PROTONIX) EC tablet 40 mg (40 mg Oral Given 06/22/21 3419)     ED Discharge Orders          Ordered    sucralfate (CARAFATE) 1 g tablet  4 times daily        06/22/21 0940             Note:  This document was prepared using Dragon voice recognition software and may include unintentional dictation errors.    Gilles Chiquito, MD 06/22/21 0945    Gilles Chiquito, MD 06/22/21 [phone]

## 2021-06-24 ENCOUNTER — Other Ambulatory Visit: Payer: Self-pay

## 2021-06-25 ENCOUNTER — Other Ambulatory Visit: Payer: Self-pay

## 2021-06-26 ENCOUNTER — Emergency Department
Admission: EM | Admit: 2021-06-26 | Discharge: 2021-06-26 | Disposition: A | Discharge status: Home / Self Care | Payer: 59 | Attending: Emergency Medicine | Admitting: Emergency Medicine

## 2021-06-26 ENCOUNTER — Emergency Department: Payer: 59

## 2021-06-26 ENCOUNTER — Other Ambulatory Visit: Payer: Self-pay

## 2021-06-26 DIAGNOSIS — Z7982 Long term (current) use of aspirin: Secondary | ICD-10-CM | POA: Diagnosis not present

## 2021-06-26 DIAGNOSIS — I10 Essential (primary) hypertension: Secondary | ICD-10-CM | POA: Insufficient documentation

## 2021-06-26 DIAGNOSIS — Z7951 Long term (current) use of inhaled steroids: Secondary | ICD-10-CM | POA: Diagnosis not present

## 2021-06-26 DIAGNOSIS — Z79899 Other long term (current) drug therapy: Secondary | ICD-10-CM | POA: Diagnosis not present

## 2021-06-26 DIAGNOSIS — Z87891 Personal history of nicotine dependence: Secondary | ICD-10-CM | POA: Diagnosis not present

## 2021-06-26 DIAGNOSIS — J45909 Unspecified asthma, uncomplicated: Secondary | ICD-10-CM | POA: Diagnosis not present

## 2021-06-26 DIAGNOSIS — R0789 Other chest pain: Secondary | ICD-10-CM | POA: Diagnosis not present

## 2021-06-26 DIAGNOSIS — R079 Chest pain, unspecified: Secondary | ICD-10-CM | POA: Diagnosis not present

## 2021-06-26 LAB — CBC
HCT: 45.4 % (ref 39.0–52.0)
Hemoglobin: 15.5 g/dL (ref 13.0–17.0)
MCH: 30 pg (ref 26.0–34.0)
MCHC: 34.1 g/dL (ref 30.0–36.0)
MCV: 87.8 fL (ref 80.0–100.0)
Platelets: 316 10*3/uL (ref 150–400)
RBC: 5.17 MIL/uL (ref 4.22–5.81)
RDW: 13.3 % (ref 11.5–15.5)
WBC: 6.8 10*3/uL (ref 4.0–10.5)
nRBC: 0 % (ref 0.0–0.2)

## 2021-06-26 LAB — BASIC METABOLIC PANEL
Anion gap: 7 (ref 5–15)
BUN: 10 mg/dL (ref 6–20)
CO2: 28 mmol/L (ref 22–32)
Calcium: 9.4 mg/dL (ref 8.9–10.3)
Chloride: 104 mmol/L (ref 98–111)
Creatinine, Ser: 1.14 mg/dL (ref 0.61–1.24)
GFR, Estimated: >60 mL/min (ref >60)
Glucose, Bld: 116 mg/dL — ABNORMAL HIGH (ref 70–99)
Potassium: 4.2 mmol/L (ref 3.5–5.1)
Sodium: 139 mmol/L (ref 135–145)

## 2021-06-26 LAB — TROPONIN I (HIGH SENSITIVITY)
Troponin I (High Sensitivity): 5 ng/L (ref <18)
Troponin I (High Sensitivity): 5 ng/L (ref <18)

## 2021-06-26 NOTE — ED Provider Notes (Signed)
[facility] Emergency Department Provider Note ____________________________________________   Event Date/Time   First MD Initiated Contact with Patient 06/26/21 [phone]     (approximate)  I have reviewed the triage vital signs and the nursing notes.   HISTORY  Chief Complaint Chest Pain    HPI Spencer Tanner is a 45 y.o. male with PMH as noted below including asthma, hypertension, and atrial fibrillation who presents with chest discomfort for the last 12 hours, intermittent, described as a pinching sensation, and lasting for few seconds at a time.  He denies associated shortness of breath or lightheadedness.  He has no nausea or vomiting.  He denies any prior history of this symptom.  The patient states that he is taking diltiazem for atrial fibrillation and has not missed any of the medication.  He has no leg pain or swelling.  Past Medical History:  Diagnosis Date   Asthma    Hypertension     There are no problems to display for this patient.   History reviewed. No pertinent surgical history.  Prior to Admission medications   Medication Sig Start Date End Date Taking? Authorizing Provider  albuterol (VENTOLIN HFA) 108 (90 Base) MCG/ACT inhaler INHALE 2 PUFFS BY MOUTH EVERY 6 HOURS AS NEEDED FOR WHEEZING 06/04/21     aspirin EC 325 MG tablet Take 1 tablet (325 mg total) by mouth daily. 05/29/21 05/29/22  Nita Sickle, MD  budesonide-formoterol [facility]) 160-4.5 MCG/ACT inhaler INHALE 2 PUFFS BY MOUTH 2 TIMES DAILY 12/20/20 12/20/21    diltiazem (CARDIZEM CD) 180 MG 24 hr capsule Take 1 capsule (180 mg total) by mouth daily. 05/29/21 05/29/22  Nita Sickle, MD  magnesium citrate SOLN Take 296 mLs (1 Bottle total) by mouth daily as needed for severe constipation. 05/29/21   Sharman Cheek, MD  metoprolol tartrate (LOPRESSOR) 100 MG tablet Take 1 tablet (100 mg total) by mouth once for 1 dose. Take 2 hours prior to your CT scan. 05/31/21  06/04/21  Debbe Odea, MD  olopatadine (PATANOL) 0.1 % ophthalmic solution Place 1 drop into both eyes 2 (two) times daily 05/20/21     omeprazole (PRILOSEC) 20 MG capsule Take 1 capsule (20 mg total) by mouth 2 (two) times daily 06/21/21     sucralfate (CARAFATE) 1 g tablet Take 1 tablet (1 g total) by mouth 4 (four) times daily for 5 days. 06/22/21 06/30/21  Gilles Chiquito, MD  tobramycin (TOBREX) 0.3 % ophthalmic solution Place 2 drops into the left eye every 4 (four) hours. 06/28/18   Tommi Rumps, PA-C    Allergies Amoxicillin and Penicillins  No family history on file.  Social History Social History   Tobacco Use   Smoking status: Former   Smokeless tobacco: Never  Building services engineer Use: Never used  Substance Use Topics   Alcohol use: No    Alcohol/week: 0.0 standard drinks   Drug use: No    Review of Systems  Constitutional: No fever/chills Eyes: No visual changes. ENT: No sore throat. Cardiovascular: Positive for intermittent chest discomfort. Respiratory: Denies shortness of breath. Gastrointestinal: No vomiting or diarrhea.  Genitourinary: Negative for dysuria.  Musculoskeletal: Negative for back pain. Skin: Negative for rash. Neurological: Negative for headaches, focal weakness or numbness.   ____________________________________________   PHYSICAL EXAM:  VITAL SIGNS: ED Triage Vitals [06/26/21 0028]  Enc Vitals Group     BP 140/81     Pulse Rate 66     Resp 18  Temp 98.1 F (36.7 C)     Temp Source Oral     SpO2 100 %     Weight 202 lb (91.6 kg)     Height 6\' 1"  (1.854 m)     Head Circumference      Peak Flow      Pain Score 8     Pain Loc      Pain Edu?      Excl. in GC?     Constitutional: Alert and oriented. Well appearing and in no acute distress. Eyes: Conjunctivae are normal.  Head: Atraumatic. Nose: No congestion/rhinnorhea. Mouth/Throat: Mucous membranes are moist.   Neck: Normal range of motion.   Cardiovascular: Normal rate, regular rhythm. Grossly normal heart sounds.  Good peripheral circulation. Respiratory: Normal respiratory effort.  No retractions. Lungs CTAB. Gastrointestinal: No distention.  Musculoskeletal: No lower extremity edema.  Extremities warm and well perfused.  Neurologic:  Normal speech and language. No gross focal neurologic deficits are appreciated.  Skin:  Skin is warm and dry. No rash noted. Psychiatric: Mood and affect are normal. Speech and behavior are normal.  ____________________________________________   LABS (all labs ordered are listed, but only abnormal results are displayed)  Labs Reviewed  BASIC METABOLIC PANEL - Abnormal; Notable for the following components:      Result Value   Glucose, Bld 116 (*)    All other components within normal limits  CBC  TROPONIN I (HIGH SENSITIVITY)  TROPONIN I (HIGH SENSITIVITY)   ____________________________________________  EKG  ED ECG REPORT I, , the attending physician, personally viewed and interpreted this ECG.  Date: 06/26/2021 EKG Time: 0033 Rate: 67 Rhythm: normal sinus rhythm QRS Axis: normal Intervals: normal ST/T Wave abnormalities: normal Narrative Interpretation: no evidence of acute ischemia  ____________________________________________  RADIOLOGY  Chest x-ray interpreted by me shows no focal consolidation or edema  ____________________________________________   PROCEDURES  Procedure(s) performed: No  Procedures  Critical Care performed: No ____________________________________________   INITIAL IMPRESSION / ASSESSMENT AND PLAN / ED COURSE  Pertinent labs & imaging results that were available during my care of the patient were reviewed by me and considered in my medical decision making (see chart for details).   45 year old male with PMH as noted above including atrial fibrillation presents with intermittent atypical chest discomfort since yesterday  afternoon, with no active symptoms at this time.  On exam the patient is well-appearing and his vital signs are normal.  The physical exam is otherwise unremarkable.  Work-up obtained from triage is all within normal limits, including EKG, chest x-ray, and basic labs.  Troponins are negative x2.  Overall presentation is consistent with potential radiculopathy/paresthesia, musculoskeletal pain, GERD, or other benign etiology.  There is no evidence of cardiac cause.  At this time, the patient is stable for discharge home.  I counseled him on the results of the work-up.  He will follow-up with his cardiologist.  Return precautions given, and he expresses understanding.   ____________________________________________   FINAL CLINICAL IMPRESSION(S) / ED DIAGNOSES  Final diagnoses:  Atypical chest pain      NEW MEDICATIONS STARTED DURING THIS VISIT:  New Prescriptions   No medications on file     Note:  This document was prepared using Dragon voice recognition software and may include unintentional dictation errors.    60, MD 06/26/21 [phone]

## 2021-06-26 NOTE — ED Triage Notes (Signed)
Pt states he has chest pain that started around 4pm this afternoon. Pt has hx of a fib states he takes dilt for it and has not missed any doses.

## 2021-06-26 NOTE — Discharge Instructions (Signed)
Return to the ER for new, worsening, or persistent severe chest pain, difficulty breathing, weakness or lightheadedness, or any other new or worsening symptoms that concern you.  Follow-up with your cardiologist if you continue to have any symptoms.

## 2021-07-01 ENCOUNTER — Telehealth: Payer: Self-pay | Admitting: Cardiology

## 2021-07-01 NOTE — Telephone Encounter (Signed)
Patient states he is feeling a slight pressure in his head . He states this has been going on for about 3 days.   Pt c/o BP issue: STAT if pt c/o blurred vision, one-sided weakness or slurred speech  1. What are your last 5 BP readings?  Today 111/69 Yesterday 129/76  2. Are you having any other symptoms (ex. Dizziness, headache, blurred vision, passed out)? Slight pressure in his head, bilateral weakness in legs.   3. What is your BP issue?

## 2021-07-02 NOTE — Telephone Encounter (Signed)
Called patient and he informed me that he has a slight pressure in his head and is wondering if that could be from his medications. He wanted to know if he should be seen or go to the ED. Reviewing patients chart he has been to the ED twice recently on 06/22/21, and 06/26/21 for hypertension and CP. All tests were within normal limits during both visits and patient was discharged. Patients CCTA was normal on 06/13/21. He has an Echocardiogram scheduled for next week on 07/08/21 and a follow up with Dr. Azucena Cecil on 07/15/21. I informed patient that because we have not started any new medications, it does not sound like a medication reaction. His BP looks good as documented below. I informed him to stay hydrated and continue to monitor his symptoms. To let us know if they worsen, otherwise continue with the plan in place. Patient verbalized understanding and agreed with plan.

## 2021-07-02 NOTE — Telephone Encounter (Signed)
Patient is calling back to discuss his condition. See telephone encounter from yesterday.

## 2021-07-03 ENCOUNTER — Other Ambulatory Visit: Payer: Self-pay

## 2021-07-03 ENCOUNTER — Encounter: Payer: Self-pay | Admitting: Emergency Medicine

## 2021-07-03 ENCOUNTER — Emergency Department
Admission: EM | Admit: 2021-07-03 | Discharge: 2021-07-03 | Disposition: A | Discharge status: Home / Self Care | Payer: 59 | Attending: Emergency Medicine | Admitting: Emergency Medicine

## 2021-07-03 DIAGNOSIS — Z79899 Other long term (current) drug therapy: Secondary | ICD-10-CM | POA: Insufficient documentation

## 2021-07-03 DIAGNOSIS — Z87891 Personal history of nicotine dependence: Secondary | ICD-10-CM | POA: Diagnosis not present

## 2021-07-03 DIAGNOSIS — R519 Headache, unspecified: Secondary | ICD-10-CM | POA: Insufficient documentation

## 2021-07-03 DIAGNOSIS — G44209 Tension-type headache, unspecified, not intractable: Secondary | ICD-10-CM

## 2021-07-03 DIAGNOSIS — J45909 Unspecified asthma, uncomplicated: Secondary | ICD-10-CM | POA: Diagnosis not present

## 2021-07-03 DIAGNOSIS — Z7982 Long term (current) use of aspirin: Secondary | ICD-10-CM | POA: Diagnosis not present

## 2021-07-03 DIAGNOSIS — I10 Essential (primary) hypertension: Secondary | ICD-10-CM | POA: Diagnosis not present

## 2021-07-03 MED ORDER — BACLOFEN 10 MG PO TABS
10.0000 mg | ORAL_TABLET | Freq: Three times a day (TID) | ORAL | 0 refills | Status: AC
  Filled 2021-07-03: qty 21, 7d supply, fill #0

## 2021-07-03 MED ORDER — MELOXICAM 15 MG PO TABS
15.0000 mg | ORAL_TABLET | Freq: Every day | ORAL | 2 refills | Status: DC
  Filled 2021-07-03: qty 30, 30d supply, fill #0

## 2021-07-03 NOTE — Discharge Instructions (Signed)
Follow-up with your regular doctor if not improved in 3 to 4 days.  Return emergency department worsening Try to do neck and shoulder stretches to relieve the tension

## 2021-07-03 NOTE — ED Provider Notes (Signed)
[facility] Emergency Department Provider Note  ____________________________________________   Event Date/Time   First MD Initiated Contact with Patient 07/03/21 1231     (approximate)  I have reviewed the triage vital signs and the nursing notes.   HISTORY  Chief Complaint Head Pressure    HPI Spencer Tanner is a 45 y.o. male presents with pressure on the top of his head.  Patient states it comes from the back of his neck to the top of his head.  Does not complain of headache.  States he does have pain into his shoulders.  No known injury.  No nausea, vomiting, or headache.  Past Medical History:  Diagnosis Date   Asthma    Hypertension     There are no problems to display for this patient.   History reviewed. No pertinent surgical history.  Prior to Admission medications   Medication Sig Start Date End Date Taking? Authorizing Provider  albuterol (VENTOLIN HFA) 108 (90 Base) MCG/ACT inhaler INHALE 2 PUFFS BY MOUTH EVERY 6 HOURS AS NEEDED FOR WHEEZING 06/04/21     aspirin EC 325 MG tablet Take 1 tablet (325 mg total) by mouth daily. 05/29/21 05/29/22  Nita Sickle, MD  baclofen (LIORESAL) 10 MG tablet Take 1 tablet (10 mg total) by mouth 3 (three) times daily for 7 days. 07/03/21 07/10/21 Yes Winfred Redel, Roselyn Bering, PA-C  budesonide-formoterol (SYMBICORT) 160-4.5 MCG/ACT inhaler INHALE 2 PUFFS BY MOUTH 2 TIMES DAILY 12/20/20 12/20/21    diltiazem (CARDIZEM CD) 180 MG 24 hr capsule Take 1 capsule (180 mg total) by mouth daily. 05/29/21 05/29/22  Nita Sickle, MD  magnesium citrate SOLN Take 296 mLs (1 Bottle total) by mouth daily as needed for severe constipation. 05/29/21   Sharman Cheek, MD  meloxicam (MOBIC) 15 MG tablet Take 1 tablet (15 mg total) by mouth daily. 07/03/21 07/03/22 Yes Wendee Hata, Roselyn Bering, PA-C  olopatadine (PATANOL) 0.1 % ophthalmic solution Place 1 drop into both eyes 2 (two) times daily 05/20/21     omeprazole  (PRILOSEC) 20 MG capsule Take 1 capsule (20 mg total) by mouth 2 (two) times daily 06/21/21     sucralfate (CARAFATE) 1 g tablet Take 1 tablet (1 g total) by mouth 4 (four) times daily for 5 days. 06/22/21 06/30/21  Gilles Chiquito, MD  tobramycin (TOBREX) 0.3 % ophthalmic solution Place 2 drops into the left eye every 4 (four) hours. 06/28/18   Tommi Rumps, PA-C    Allergies Amoxicillin and Penicillins  History reviewed. No pertinent family history.  Social History Social History   Tobacco Use   Smoking status: Former   Smokeless tobacco: Never  Building services engineer Use: Never used  Substance Use Topics   Alcohol use: No    Alcohol/week: 0.0 standard drinks   Drug use: No    Review of Systems  Constitutional: No fever/chills Eyes: No visual changes. ENT: No sore throat. Respiratory: Denies cough Cardiovascular: Denies chest pain Gastrointestinal: Denies abdominal pain Genitourinary: Negative for dysuria. Musculoskeletal: Negative for back pain. Skin: Negative for rash. Psychiatric: no mood changes,     ____________________________________________   PHYSICAL EXAM:  VITAL SIGNS: ED Triage Vitals  Enc Vitals Group     BP 07/03/21 1226 (!) 157/63     Pulse Rate 07/03/21 1226 73     Resp 07/03/21 1226 18     Temp 07/03/21 1226 98.4 F (36.9 C)     Temp Source 07/03/21 1226 Oral  SpO2 07/03/21 1226 99 %     Weight 07/03/21 1227 202 lb (91.6 kg)     Height 07/03/21 1227 6\' 1"  (1.854 m)     Head Circumference --      Peak Flow --      Pain Score 07/03/21 1227 8     Pain Loc --      Pain Edu? --      Excl. in GC? --     Constitutional: Alert and oriented. Well appearing and in no acute distress. Eyes: Conjunctivae are normal.  Head: Atraumatic. Nose: No congestion/rhinnorhea. Mouth/Throat: Mucous membranes are moist.   Neck:  supple no lymphadenopathy noted Cardiovascular: Normal rate, regular rhythm. Heart sounds are normal Respiratory: Normal  respiratory effort.  No retractions, lungs c t a  GU: deferred Musculoskeletal: FROM all extremities, warm and well perfused, C-spine nontender, paravertebral muscles tender and spasm, tender at muscle insertion on the back of the skull Neurologic:  Normal speech and language.  Skin:  Skin is warm, dry and intact. No rash noted. Psychiatric: Mood and affect are normal. Speech and behavior are normal.  ____________________________________________   LABS (all labs ordered are listed, but only abnormal results are displayed)  Labs Reviewed - No data to display ____________________________________________   ____________________________________________  RADIOLOGY   ____________________________________________   PROCEDURES  Procedure(s) performed:No  Procedures    ____________________________________________   INITIAL IMPRESSION / ASSESSMENT AND PLAN / ED COURSE  Pertinent labs & imaging results that were available during my care of the patient were reviewed by me and considered in my medical decision making (see chart for details).   The patient is a 45 year old male presents with tension-like pressure to the back of the head.  See HPI.  Physical exam is consistent with same.  Do not feel the patient needs imaging at this time.  Is afebrile.  Has had no injury.  Feel this is more of a tension type pressure.  We will do a trial of anti-inflammatories muscle relaxers.  If symptoms do not improve with this medication he should follow-up with his regular physician for further evaluation.  Discharged in stable condition.     Spencer Tanner was evaluated in Emergency Department on 07/03/2021 for the symptoms described in the history of present illness. He was evaluated in the context of the global COVID-19 pandemic, which necessitated consideration that the patient might be at risk for infection with the SARS-CoV-2 virus that causes COVID-19. Institutional protocols and  algorithms that pertain to the evaluation of patients at risk for COVID-19 are in a state of rapid change based on information released by regulatory bodies including the CDC and federal and state organizations. These policies and algorithms were followed during the patient's care in the ED.    As part of my medical decision making, I reviewed the following data within the electronic MEDICAL RECORD NUMBER Nursing notes reviewed and incorporated, Old chart reviewed, Notes from prior ED visits, and [place] Controlled Substance Database  ____________________________________________   FINAL CLINICAL IMPRESSION(S) / ED DIAGNOSES  Final diagnoses:  Tension headache      NEW MEDICATIONS STARTED DURING THIS VISIT:  New Prescriptions   BACLOFEN (LIORESAL) 10 MG TABLET    Take 1 tablet (10 mg total) by mouth 3 (three) times daily for 7 days.   MELOXICAM (MOBIC) 15 MG TABLET    Take 1 tablet (15 mg total) by mouth daily.     Note:  This document was prepared using 07/05/2021  software and may include unintentional dictation errors.    Faythe Ghee, PA-C 07/03/21 1559    Merwyn Katos, MD 07/03/21 [phone]

## 2021-07-03 NOTE — ED Triage Notes (Signed)
Pt comes into the ED via POV c/o head pressure that has been ongoing x 3 days.  Pt denies any nasal congestion, denies a headache and denies any cold like symptoms.  PT in NAD at this time with even and unlabored respirations at this time.  Pt ambulatory to triage and is neurologically intact.

## 2021-07-08 ENCOUNTER — Other Ambulatory Visit: Payer: Self-pay

## 2021-07-08 ENCOUNTER — Ambulatory Visit (INDEPENDENT_AMBULATORY_CARE_PROVIDER_SITE_OTHER): Payer: 59

## 2021-07-08 DIAGNOSIS — R072 Precordial pain: Secondary | ICD-10-CM

## 2021-07-08 LAB — ECHOCARDIOGRAM COMPLETE
AR max vel: 3.55 cm2
AV Area VTI: 3.65 cm2
AV Area mean vel: 3.36 cm2
AV Mean grad: 4 mmHg
AV Peak grad: 7.4 mmHg
Ao pk vel: 1.36 m/s
Area-P 1/2: 4.1 cm2
Calc EF: 53.3 %
S' Lateral: 3.1 cm
Single Plane A2C EF: 52.1 %
Single Plane A4C EF: 55.9 %

## 2021-07-15 ENCOUNTER — Encounter: Payer: Self-pay | Admitting: Cardiology

## 2021-07-15 ENCOUNTER — Other Ambulatory Visit: Payer: Self-pay

## 2021-07-15 ENCOUNTER — Ambulatory Visit (INDEPENDENT_AMBULATORY_CARE_PROVIDER_SITE_OTHER): Payer: 59 | Admitting: Cardiology

## 2021-07-15 VITALS — BP 140/68 | HR 70 | Ht 73.0 in | Wt 204.0 lb

## 2021-07-15 DIAGNOSIS — R072 Precordial pain: Secondary | ICD-10-CM

## 2021-07-15 DIAGNOSIS — I480 Paroxysmal atrial fibrillation: Secondary | ICD-10-CM | POA: Diagnosis not present

## 2021-07-15 NOTE — Progress Notes (Signed)
Cardiology Office Note:    Date:  07/15/2021   ID:  Spencer Tanner, DOB 1976/02/14, MRN [id number]  PCP:  Barbette Reichmann, MD   [facility] Providers Cardiologist:  None     Referring MD: Barbette Reichmann, MD   Chief Complaint  Patient presents with   OTher    Follow up post testing. Patient c/o pressure in his head and "his legs are light" Meds reviewed verbally with patient.     History of Present Illness:    Spencer Tanner is a 45 y.o. male with a hx of asthma, paroxysmal atrial fibrillation who presents for follow-up.    Last seen due to atrial fibrillation and chest pain.  Cardiac monitor was placed to evaluate A. fib burden.  Currently taking Cardizem 180 mg daily.  Denies palpitations, denies further chest pain.  States having occasional head pressure and left leg lightheadedness.  Plans to follow-up with PCP regarding this.  Prior notes Echo 06/2021 EF 60% Due to A. fib.  Patient was seen in the ED 05/29/2021 due to chest pain.  EKG showed A. fib with RVR.  Cardiac monitor 06/2021, no A. fib or atrial flutter noted.   Past Medical History:  Diagnosis Date   Asthma    Hypertension     History reviewed. No pertinent surgical history.  Current Medications: Current Meds  Medication Sig   albuterol (VENTOLIN HFA) 108 (90 Base) MCG/ACT inhaler INHALE 2 PUFFS BY MOUTH EVERY 6 HOURS AS NEEDED FOR WHEEZING   aspirin EC 325 MG tablet Take 1 tablet (325 mg total) by mouth daily.   budesonide-formoterol (SYMBICORT) 160-4.5 MCG/ACT inhaler INHALE 2 PUFFS BY MOUTH 2 TIMES DAILY   diltiazem (CARDIZEM CD) 180 MG 24 hr capsule Take 1 capsule (180 mg total) by mouth daily.   magnesium citrate SOLN Take 296 mLs (1 Bottle total) by mouth daily as needed for severe constipation.   meloxicam (MOBIC) 15 MG tablet Take 1 tablet (15 mg total) by mouth daily.   olopatadine (PATANOL) 0.1 % ophthalmic solution Place 1 drop into both eyes 2 (two) times daily    omeprazole (PRILOSEC) 20 MG capsule Take 1 capsule (20 mg total) by mouth 2 (two) times daily   sucralfate (CARAFATE) 1 g tablet Take 1 tablet (1 g total) by mouth 4 (four) times daily for 5 days.   tobramycin (TOBREX) 0.3 % ophthalmic solution Place 2 drops into the left eye every 4 (four) hours.     Allergies:   Amoxicillin and Penicillins   Social History   Socioeconomic History   Marital status: Single    Spouse name: Not on file   Number of children: Not on file   Years of education: Not on file   Highest education level: Not on file  Occupational History   Not on file  Tobacco Use   Smoking status: Former   Smokeless tobacco: Never  Vaping Use   Vaping Use: Never used  Substance and Sexual Activity   Alcohol use: No    Alcohol/week: 0.0 standard drinks   Drug use: No   Sexual activity: Not on file  Other Topics Concern   Not on file  Social History Narrative   Not on file   Social Determinants of Health   Financial Resource Strain: Not on file  Food Insecurity: Not on file  Transportation Needs: Not on file  Physical Activity: Not on file  Stress: Not on file  Social Connections: Not on file     Family  History: The patient's family history is not on file.  ROS:   Please see the history of present illness.     All other systems reviewed and are negative.  EKGs/Labs/Other Studies Reviewed:    The following studies were reviewed today:   EKG:  EKG not ordered today.   Recent Labs: 05/29/2021: Magnesium 2.4; TSH 2.111 06/21/2021: ALT 75 06/26/2021: BUN 10; Creatinine, Ser 1.14; Hemoglobin 15.5; Platelets 316; Potassium 4.2; Sodium 139  Recent Lipid Panel No results found for: CHOL, TRIG, HDL, CHOLHDL, VLDL, LDLCALC, LDLDIRECT   Risk Assessment/Calculations:          Physical Exam:    VS:  BP 140/68 (BP Location: Left Arm, Patient Position: Sitting, Cuff Size: Normal)   Pulse 70   Ht 6\' 1"  (1.854 m)   Wt 204 lb (92.5 kg)   SpO2 99%   BMI  26.91 kg/m     Wt Readings from Last 3 Encounters:  07/15/21 204 lb (92.5 kg)  07/03/21 202 lb (91.6 kg)  06/26/21 202 lb (91.6 kg)     GEN:  Well nourished, well developed in no acute distress HEENT: Normal NECK: No JVD; No carotid bruits LYMPHATICS: No lymphadenopathy CARDIAC: RRR, no murmurs, rubs, gallops RESPIRATORY:  Clear to auscultation without rales, wheezing or rhonchi  ABDOMEN: Soft, non-tender, non-distended MUSCULOSKELETAL:  No edema; No deformity  SKIN: Warm and dry NEUROLOGIC:  Alert and oriented x 3 PSYCHIATRIC:  Normal affect   ASSESSMENT:    1. Paroxysmal atrial fibrillation (HCC)   2. Precordial pain     PLAN:    In order of problems listed above:  Paroxysmal atrial fibrillation, CHA2DS2-VASc score of 0.  Continue diltiazem 180, aspirin okay.  Echocardiogram shows normal systolic and diastolic function, EF 60 to 65%.  Cardiac monitor with no evidence of atrial fibrillation or atrial flutter. Chest pain, former smoker.  Normal coronary CTA, no evidence of CAD.  Follow-up yearly.     Medication Adjustments/Labs and Tests Ordered: Current medicines are reviewed at length with the patient today.  Concerns regarding medicines are outlined above.  No orders of the defined types were placed in this encounter.   No orders of the defined types were placed in this encounter.    Patient Instructions  Medication Instructions:  Your physician recommends that you continue on your current medications as directed. Please refer to the Current Medication list given to you today.  *If you need a refill on your cardiac medications before your next appointment, please call your pharmacy*   Lab Work: None ordered If you have labs (blood work) drawn today and your tests are completely normal, you will receive your results only by: MyChart Message (if you have MyChart) OR A paper copy in the mail If you have any lab test that is abnormal or we need to change your  treatment, we will call you to review the results.   Testing/Procedures: None ordered   Follow-Up: At [facility], you and your health needs are our priority.  As part of our continuing mission to provide you with exceptional heart care, we have created designated Provider Care Teams.  These Care Teams include your primary Cardiologist (physician) and Advanced Practice Providers (APPs -  Physician Assistants and Nurse Practitioners) who all work together to provide you with the care you need, when you need it.  We recommend signing up for the patient portal called "MyChart".  Sign up information is provided on this After Visit Summary.  MyChart is  used to connect with patients for Virtual Visits (Telemedicine).  Patients are able to view lab/test results, encounter notes, upcoming appointments, etc.  Non-urgent messages can be sent to your provider as well.   To learn more about what you can do with MyChart, go to [email or web address].    Your next appointment:   1 year(s)  The format for your next appointment:   In Person  Provider:   You may see Dr. Azucena Cecil or one of the following Advanced Practice Providers on your designated Care Team:   Nicolasa Ducking, NP Eula Listen, PA-C Cadence Fransico Michael, [place]    Other Instructions    Signed, Debbe Odea, MD  07/15/2021 4:34 PM    [facility]

## 2021-07-15 NOTE — Patient Instructions (Signed)
Medication Instructions:  Your physician recommends that you continue on your current medications as directed. Please refer to the Current Medication list given to you today.  *If you need a refill on your cardiac medications before your next appointment, please call your pharmacy*   Lab Work: None ordered If you have labs (blood work) drawn today and your tests are completely normal, you will receive your results only by: MyChart Message (if you have MyChart) OR A paper copy in the mail If you have any lab test that is abnormal or we need to change your treatment, we will call you to review the results.   Testing/Procedures: None ordered   Follow-Up: At [facility], you and your health needs are our priority.  As part of our continuing mission to provide you with exceptional heart care, we have created designated Provider Care Teams.  These Care Teams include your primary Cardiologist (physician) and Advanced Practice Providers (APPs -  Physician Assistants and Nurse Practitioners) who all work together to provide you with the care you need, when you need it.  We recommend signing up for the patient portal called "MyChart".  Sign up information is provided on this After Visit Summary.  MyChart is used to connect with patients for Virtual Visits (Telemedicine).  Patients are able to view lab/test results, encounter notes, upcoming appointments, etc.  Non-urgent messages can be sent to your provider as well.   To learn more about what you can do with MyChart, go to [email or web address].    Your next appointment:   1 year(s)  The format for your next appointment:   In Person  Provider:   You may see Dr. Azucena Cecil or one of the following Advanced Practice Providers on your designated Care Team:   Nicolasa Ducking, NP Eula Listen, PA-C Cadence Fransico Michael, [place]    Other Instructions

## 2021-07-16 ENCOUNTER — Other Ambulatory Visit: Payer: Self-pay | Admitting: Family Medicine

## 2021-07-16 ENCOUNTER — Ambulatory Visit
Admission: RE | Admit: 2021-07-16 | Discharge: 2021-07-16 | Disposition: A | Discharge status: Home / Self Care | Payer: 59 | Source: Ambulatory Visit | Attending: Family Medicine | Admitting: Family Medicine

## 2021-07-16 DIAGNOSIS — Y92009 Unspecified place in unspecified non-institutional (private) residence as the place of occurrence of the external cause: Secondary | ICD-10-CM | POA: Insufficient documentation

## 2021-07-16 DIAGNOSIS — R519 Headache, unspecified: Secondary | ICD-10-CM | POA: Insufficient documentation

## 2021-07-16 DIAGNOSIS — R200 Anesthesia of skin: Secondary | ICD-10-CM | POA: Diagnosis not present

## 2021-07-16 DIAGNOSIS — W010XXA Fall on same level from slipping, tripping and stumbling without subsequent striking against object, initial encounter: Secondary | ICD-10-CM | POA: Diagnosis not present

## 2021-07-16 DIAGNOSIS — G311 Senile degeneration of brain, not elsewhere classified: Secondary | ICD-10-CM | POA: Insufficient documentation

## 2021-07-16 DIAGNOSIS — S8992XA Unspecified injury of left lower leg, initial encounter: Secondary | ICD-10-CM | POA: Diagnosis not present

## 2021-07-16 DIAGNOSIS — W19XXXA Unspecified fall, initial encounter: Secondary | ICD-10-CM | POA: Diagnosis not present

## 2021-07-16 DIAGNOSIS — S0990XA Unspecified injury of head, initial encounter: Secondary | ICD-10-CM | POA: Diagnosis present

## 2021-07-17 ENCOUNTER — Other Ambulatory Visit: Payer: Self-pay

## 2021-07-17 MED ORDER — PREDNISONE 20 MG PO TABS
ORAL_TABLET | ORAL | 0 refills | Status: DC
  Filled 2021-07-17: qty 5, 5d supply, fill #0

## 2021-07-19 ENCOUNTER — Telehealth: Payer: Self-pay | Admitting: Cardiology

## 2021-07-19 NOTE — Telephone Encounter (Signed)
Patient came by office to fill out forms and pay $29 cash fee Placed forms in nurse box

## 2021-07-19 NOTE — Telephone Encounter (Signed)
LMOV about [employer] Disability forms

## 2021-07-23 NOTE — Telephone Encounter (Signed)
Spoke with Dr. Azucena Cecil and he reviewed patients chart. Patient does not meet any criteria to be given disability from a cardiac perspective. Called patient to inform him and got his VM. Requested a call back.

## 2021-07-23 NOTE — Telephone Encounter (Signed)
Patient called back and I informed him that we are not able to complete his disability forms. Patient will come by tomorrow to pick up the $29 cash fee that he paid, and the disability paperwork. Patient verbalized understanding and agreed with plan.

## 2021-07-24 NOTE — Telephone Encounter (Signed)
Patient picked up form and was refunded $29 cash fee .

## 2021-07-25 ENCOUNTER — Other Ambulatory Visit: Payer: Self-pay

## 2021-07-25 MED ORDER — PREDNISONE 10 MG PO TABS
ORAL_TABLET | ORAL | 0 refills | Status: DC
  Filled 2021-07-25: qty 18, 9d supply, fill #0

## 2021-07-26 ENCOUNTER — Other Ambulatory Visit: Payer: Self-pay

## 2021-07-26 ENCOUNTER — Emergency Department
Admission: EM | Admit: 2021-07-26 | Discharge: 2021-07-27 | Disposition: A | Discharge status: Home / Self Care | Payer: 59 | Attending: Emergency Medicine | Admitting: Emergency Medicine

## 2021-07-26 DIAGNOSIS — I10 Essential (primary) hypertension: Secondary | ICD-10-CM | POA: Insufficient documentation

## 2021-07-26 DIAGNOSIS — Z87891 Personal history of nicotine dependence: Secondary | ICD-10-CM | POA: Insufficient documentation

## 2021-07-26 DIAGNOSIS — Z79899 Other long term (current) drug therapy: Secondary | ICD-10-CM | POA: Insufficient documentation

## 2021-07-26 DIAGNOSIS — J45909 Unspecified asthma, uncomplicated: Secondary | ICD-10-CM | POA: Diagnosis not present

## 2021-07-26 DIAGNOSIS — Z7982 Long term (current) use of aspirin: Secondary | ICD-10-CM | POA: Diagnosis not present

## 2021-07-26 DIAGNOSIS — R519 Headache, unspecified: Secondary | ICD-10-CM | POA: Insufficient documentation

## 2021-07-26 DIAGNOSIS — Z7951 Long term (current) use of inhaled steroids: Secondary | ICD-10-CM | POA: Diagnosis not present

## 2021-07-26 MED ORDER — PROCHLORPERAZINE MALEATE 10 MG PO TABS
10.0000 mg | ORAL_TABLET | Freq: Once | ORAL | Status: AC
  Administered 2021-07-27: 10 mg via ORAL
  Filled 2021-07-26: qty 1

## 2021-07-26 MED ORDER — PROCHLORPERAZINE MALEATE 10 MG PO TABS
10.0000 mg | ORAL_TABLET | Freq: Four times a day (QID) | ORAL | 0 refills | Status: DC | PRN
  Filled 2021-07-29: qty 20, 5d supply, fill #0

## 2021-07-26 NOTE — ED Triage Notes (Signed)
Pt presents to ER c/o pressure in his head that he states goes from the back of his head around the sides of his head.  Pt states he has been rx prednisone and meloxicam for this head pressure without any relief.  Pt A&O x4 at this time.  NIH 0

## 2021-07-26 NOTE — Discharge Instructions (Signed)
1.  You may take Compazine as needed for headaches. 2.  Return to the ER for worsening symptoms, persistent vomiting, lethargy or other concerns.

## 2021-07-26 NOTE — ED Provider Notes (Signed)
[facility] Emergency Department Provider Note   ____________________________________________   Event Date/Time   First MD Initiated Contact with Patient 07/26/21 2315     (approximate)  I have reviewed the triage vital signs and the nursing notes.   HISTORY  Chief Complaint Headache    HPI Spencer Tanner is a 45 y.o. male who presents to the ED from home with a chief complaint of head pressure.  Symptoms ongoing since fall with striking head November 6.  He was seen initially in the ED and prescribed meloxicam and prednisone which he states has not helped his symptoms.  He was seen by his PCP with normal CT scan done.  History of craniotomy as a baby for "fluid".  History of PAF not on anticoagulation.  Describes tight band around his head with occasional spikes of sharp pain.  Not associated with photophobia, vision changes, nausea/vomiting, dizziness, slurred speech, confusion, extremity weakness/numbness/tingling.  Denies recent fever, cough, chest pain, shortness of breath, abdominal pain.  Denies family history of migraine headaches.  Denies illicit drug use.      Past Medical History:  Diagnosis Date   Asthma    Hypertension     There are no problems to display for this patient.   History reviewed. No pertinent surgical history.  Prior to Admission medications   Medication Sig Start Date End Date Taking? Authorizing Provider  prochlorperazine (COMPAZINE) 10 MG tablet Take 1 tablet (10 mg total) by mouth every 6 (six) hours as needed for nausea (headache). 07/26/21  Yes Irean Hong, MD  albuterol (VENTOLIN HFA) 108 (90 Base) MCG/ACT inhaler INHALE 2 PUFFS BY MOUTH EVERY 6 HOURS AS NEEDED FOR WHEEZING 06/04/21     aspirin EC 325 MG tablet Take 1 tablet (325 mg total) by mouth daily. 05/29/21 05/29/22  Nita Sickle, MD  budesonide-formoterol [facility]) 160-4.5 MCG/ACT inhaler INHALE 2 PUFFS BY MOUTH 2 TIMES DAILY 12/20/20 12/20/21     diltiazem (CARDIZEM CD) 180 MG 24 hr capsule Take 1 capsule (180 mg total) by mouth daily. 05/29/21 05/29/22  Nita Sickle, MD  magnesium citrate SOLN Take 296 mLs (1 Bottle total) by mouth daily as needed for severe constipation. 05/29/21   Sharman Cheek, MD  meloxicam (MOBIC) 15 MG tablet Take 1 tablet (15 mg total) by mouth daily. 07/03/21 07/03/22  Fisher, Roselyn Bering, PA-C  olopatadine (PATANOL) 0.1 % ophthalmic solution Place 1 drop into both eyes 2 (two) times daily 05/20/21     omeprazole (PRILOSEC) 20 MG capsule Take 1 capsule (20 mg total) by mouth 2 (two) times daily 06/21/21     predniSONE (DELTASONE) 10 MG tablet Take 1 tablet (10 mg total) by mouth once daily 3,3,3,2,2,2,1,1,1 and stop 07/24/21     predniSONE (DELTASONE) 20 MG tablet Take 1 tablet (20 mg total) by mouth once daily 07/17/21     sucralfate (CARAFATE) 1 g tablet Take 1 tablet (1 g total) by mouth 4 (four) times daily for 5 days. 06/22/21 07/15/21  Gilles Chiquito, MD  tobramycin (TOBREX) 0.3 % ophthalmic solution Place 2 drops into the left eye every 4 (four) hours. 06/28/18   Tommi Rumps, PA-C    Allergies Amoxicillin and Penicillins  History reviewed. No pertinent family history.  Social History Social History   Tobacco Use   Smoking status: Former   Smokeless tobacco: Never  Building services engineer Use: Never used  Substance Use Topics   Alcohol use: No    Alcohol/week: 0.0  standard drinks   Drug use: No    Review of Systems  Constitutional: No fever/chills Eyes: No visual changes. ENT: No sore throat. Cardiovascular: Denies chest pain. Respiratory: Denies shortness of breath. Gastrointestinal: No abdominal pain.  No nausea, no vomiting.  No diarrhea.  No constipation. Genitourinary: Negative for dysuria. Musculoskeletal: Negative for back pain. Skin: Negative for rash. Neurological: Positive for headache. Negative for focal weakness or  numbness.   ____________________________________________   PHYSICAL EXAM:  VITAL SIGNS: ED Triage Vitals  Enc Vitals Group     BP 07/26/21 1945 135/81     Pulse Rate 07/26/21 1945 68     Resp 07/26/21 1945 20     Temp 07/26/21 1945 98.8 F (37.1 C)     Temp Source 07/26/21 1945 Oral     SpO2 07/26/21 1945 100 %     Weight 07/26/21 1945 205 lb (93 kg)     Height 07/26/21 1945 6\' 1"  (1.854 m)     Head Circumference --      Peak Flow --      Pain Score 07/26/21 1953 8     Pain Loc --      Pain Edu? --      Excl. in [place]? --     Constitutional: Alert and oriented. Well appearing and in no acute distress. Eyes: Conjunctivae are normal. PERRL. EOMI. Head: Atraumatic.  No sinus tenderness to palpation. Nose: No congestion/rhinnorhea. Mouth/Throat: Mucous membranes are moist.  Oropharynx non-erythematous. Neck: No stridor.  No cervical spine tenderness to palpation.  No carotid bruits.  Supple neck without meningismus. Cardiovascular: Normal rate, regular rhythm. Grossly normal heart sounds.  Good peripheral circulation. Respiratory: Normal respiratory effort.  No retractions. Lungs CTAB. Gastrointestinal: Soft and nontender. No distention. No abdominal bruits. No CVA tenderness. Musculoskeletal: No lower extremity tenderness nor edema.  No joint effusions. Neurologic: Alert and oriented x3.  CN II -XII grossly intact.  Normal speech and language. No gross focal neurologic deficits are appreciated. No gait instability. Skin:  Skin is warm, dry and intact. No rash noted. Psychiatric: Mood and affect are normal. Speech and behavior are normal.  ____________________________________________   LABS (all labs ordered are listed, but only abnormal results are displayed)  Labs Reviewed - No data to display ____________________________________________  EKG  None ____________________________________________  RADIOLOGY I, Terrion Gencarelli J, personally viewed and evaluated these images  (plain radiographs) as part of my medical decision making, as well as reviewing the written report by the radiologist.  ED MD interpretation: None  Official radiology report(s): No results found.  ____________________________________________   PROCEDURES  Procedure(s) performed (including Critical Care):  Procedures   ____________________________________________   INITIAL IMPRESSION / ASSESSMENT AND PLAN / ED COURSE  As part of my medical decision making, I reviewed the following data within the electronic MEDICAL RECORD NUMBER Nursing notes reviewed and incorporated, Old chart reviewed, and Notes from prior ED visits     45 year old male presenting with a 5-week history of headache. Differential diagnosis includes, but is not limited to, intracranial hemorrhage, meningitis/encephalitis, previous head trauma, cavernous venous thrombosis, tension headache, temporal arteritis, migraine or migraine equivalent, idiopathic intracranial hypertension, and non-specific headache.   He is well-appearing on examination, no focal neurological deficits, supple neck without meningismus.  He is driving so will provide Compazine tablet to take when he gets home along with a prescription.  Will refer to neurology for further evaluation of chronic headaches.  Strict return precautions given.  Patient verbalizes understanding and agrees  with plan of care.      ____________________________________________   FINAL CLINICAL IMPRESSION(S) / ED DIAGNOSES  Final diagnoses:  Acute nonintractable headache, unspecified headache type     ED Discharge Orders          Ordered    prochlorperazine (COMPAZINE) 10 MG tablet  Every 6 hours PRN        07/26/21 2354             Note:  This document was prepared using Dragon voice recognition software and may include unintentional dictation errors.    Irean Hong, MD 07/27/21 [phone]

## 2021-07-27 DIAGNOSIS — J45909 Unspecified asthma, uncomplicated: Secondary | ICD-10-CM | POA: Diagnosis not present

## 2021-07-27 DIAGNOSIS — I10 Essential (primary) hypertension: Secondary | ICD-10-CM | POA: Diagnosis not present

## 2021-07-27 DIAGNOSIS — R519 Headache, unspecified: Secondary | ICD-10-CM | POA: Diagnosis not present

## 2021-07-27 DIAGNOSIS — Z79899 Other long term (current) drug therapy: Secondary | ICD-10-CM | POA: Diagnosis not present

## 2021-07-27 DIAGNOSIS — Z87891 Personal history of nicotine dependence: Secondary | ICD-10-CM | POA: Diagnosis not present

## 2021-07-27 DIAGNOSIS — Z7951 Long term (current) use of inhaled steroids: Secondary | ICD-10-CM | POA: Diagnosis not present

## 2021-07-27 DIAGNOSIS — Z7982 Long term (current) use of aspirin: Secondary | ICD-10-CM | POA: Diagnosis not present

## 2021-07-29 ENCOUNTER — Other Ambulatory Visit: Payer: Self-pay

## 2021-08-26 ENCOUNTER — Other Ambulatory Visit: Payer: Self-pay

## 2021-09-08 ENCOUNTER — Other Ambulatory Visit: Payer: Self-pay

## 2021-09-08 ENCOUNTER — Emergency Department: Payer: 59

## 2021-09-08 DIAGNOSIS — J45909 Unspecified asthma, uncomplicated: Secondary | ICD-10-CM | POA: Diagnosis not present

## 2021-09-08 DIAGNOSIS — R1011 Right upper quadrant pain: Secondary | ICD-10-CM | POA: Diagnosis not present

## 2021-09-08 DIAGNOSIS — Z7982 Long term (current) use of aspirin: Secondary | ICD-10-CM | POA: Insufficient documentation

## 2021-09-08 DIAGNOSIS — R0789 Other chest pain: Secondary | ICD-10-CM | POA: Diagnosis not present

## 2021-09-08 DIAGNOSIS — I10 Essential (primary) hypertension: Secondary | ICD-10-CM | POA: Insufficient documentation

## 2021-09-08 DIAGNOSIS — R079 Chest pain, unspecified: Secondary | ICD-10-CM | POA: Diagnosis not present

## 2021-09-08 LAB — BASIC METABOLIC PANEL
Anion gap: 7 (ref 5–15)
BUN: 12 mg/dL (ref 6–20)
CO2: 29 mmol/L (ref 22–32)
Calcium: 9.5 mg/dL (ref 8.9–10.3)
Chloride: 101 mmol/L (ref 98–111)
Creatinine, Ser: 1.21 mg/dL (ref 0.61–1.24)
GFR, Estimated: >60 mL/min (ref >60)
Glucose, Bld: 95 mg/dL (ref 70–99)
Potassium: 4.1 mmol/L (ref 3.5–5.1)
Sodium: 137 mmol/L (ref 135–145)

## 2021-09-08 LAB — HEPATIC FUNCTION PANEL
ALT: 45 U/L — ABNORMAL HIGH (ref 0–44)
AST: 25 U/L (ref 15–41)
Albumin: 3.9 g/dL (ref 3.5–5.0)
Alkaline Phosphatase: 81 U/L (ref 38–126)
Bilirubin, Direct: <0.1 mg/dL (ref 0.0–0.2)
Total Bilirubin: 0.5 mg/dL (ref 0.3–1.2)
Total Protein: 7.4 g/dL (ref 6.5–8.1)

## 2021-09-08 LAB — CBC
HCT: 46.5 % (ref 39.0–52.0)
Hemoglobin: 15.9 g/dL (ref 13.0–17.0)
MCH: 30.1 pg (ref 26.0–34.0)
MCHC: 34.2 g/dL (ref 30.0–36.0)
MCV: 87.9 fL (ref 80.0–100.0)
Platelets: 310 10*3/uL (ref 150–400)
RBC: 5.29 MIL/uL (ref 4.22–5.81)
RDW: 13.2 % (ref 11.5–15.5)
WBC: 6.6 10*3/uL (ref 4.0–10.5)
nRBC: 0 % (ref 0.0–0.2)

## 2021-09-08 LAB — TROPONIN I (HIGH SENSITIVITY): Troponin I (High Sensitivity): 5 ng/L (ref <18)

## 2021-09-08 LAB — LIPASE, BLOOD: Lipase: 35 U/L (ref 11–51)

## 2021-09-08 NOTE — ED Triage Notes (Signed)
Pt ambulatory to triage, NAD noted. Pt c/o left chest pain that started a few days ago. Pt denies nausea, vomiting, and/or shortness of breath.

## 2021-09-09 ENCOUNTER — Encounter: Payer: Self-pay | Admitting: Radiology

## 2021-09-09 ENCOUNTER — Emergency Department
Admission: EM | Admit: 2021-09-09 | Discharge: 2021-09-09 | Disposition: A | Discharge status: Home / Self Care | Payer: 59 | Attending: Emergency Medicine | Admitting: Emergency Medicine

## 2021-09-09 ENCOUNTER — Other Ambulatory Visit: Payer: Self-pay

## 2021-09-09 DIAGNOSIS — R079 Chest pain, unspecified: Secondary | ICD-10-CM

## 2021-09-09 DIAGNOSIS — R0789 Other chest pain: Secondary | ICD-10-CM | POA: Diagnosis not present

## 2021-09-09 DIAGNOSIS — J45909 Unspecified asthma, uncomplicated: Secondary | ICD-10-CM | POA: Diagnosis not present

## 2021-09-09 DIAGNOSIS — Z7982 Long term (current) use of aspirin: Secondary | ICD-10-CM | POA: Diagnosis not present

## 2021-09-09 DIAGNOSIS — I10 Essential (primary) hypertension: Secondary | ICD-10-CM | POA: Diagnosis not present

## 2021-09-09 DIAGNOSIS — R1011 Right upper quadrant pain: Secondary | ICD-10-CM | POA: Diagnosis not present

## 2021-09-09 LAB — TROPONIN I (HIGH SENSITIVITY): Troponin I (High Sensitivity): 5 ng/L (ref <18)

## 2021-09-09 MED ORDER — HYDROCODONE-ACETAMINOPHEN 5-325 MG PO TABS
ORAL_TABLET | ORAL | 0 refills | Status: DC
  Filled 2021-09-09: qty 15, 4d supply, fill #0

## 2021-09-09 MED ORDER — HYDROCODONE-ACETAMINOPHEN 5-325 MG PO TABS: 1.0000 | ORAL_TABLET | Freq: Four times a day (QID) | ORAL | 0 refills | Status: DC | PRN

## 2021-09-09 MED ORDER — KETOROLAC TROMETHAMINE 30 MG/ML IJ SOLN
15.0000 mg | Freq: Once | INTRAMUSCULAR | Status: AC
  Administered 2021-09-09: 15 mg via INTRAVENOUS
  Filled 2021-09-09: qty 1

## 2021-09-09 MED ORDER — NAPROXEN 500 MG PO TABS: 500.0000 mg | ORAL_TABLET | Freq: Two times a day (BID) | ORAL | 0 refills | Status: DC

## 2021-09-09 MED ORDER — NAPROXEN 500 MG PO TABS
ORAL_TABLET | ORAL | 0 refills | Status: DC
  Filled 2021-09-09: qty 10, 5d supply, fill #0

## 2021-09-09 NOTE — ED Provider Notes (Signed)
[facility] Provider Note    Event Date/Time   First MD Initiated Contact with Patient 09/09/21 0116     (approximate)   History   Chest Pain   HPI  Lashone Loduca is a 46 y.o. male who presents to the ED from home with a chief complaint of chest and abdominal pain.  Patient has a history of hypertension, PAF not on anticoagulation who reports a 3-day history of left chest and right upper quadrant pain.  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  Pain is not exacerbated by deep breathing.  Denies fever, chills, cough, shortness of breath, abdominal pain, nausea, vomiting or dizziness.  Denies recent travel, trauma or hormone use.  Denies exercising excessively or using supplements.     Past Medical History   Past Medical History:  Diagnosis Date   Asthma    Hypertension      Active Problem List  There are no problems to display for this patient.    Past Surgical History  History reviewed. No pertinent surgical history.   Home Medications   Prior to Admission medications   Medication Sig Start Date End Date Taking? Authorizing Provider  HYDROcodone-acetaminophen (NORCO) 5-325 MG tablet Take 1 tablet by mouth every 6 (six) hours as needed for moderate pain. 09/09/21  Yes Paulette Blanch, MD  naproxen (NAPROSYN) 500 MG tablet Take 1 tablet (500 mg total) by mouth 2 (two) times daily with a meal. 09/09/21  Yes Paulette Blanch, MD  albuterol (VENTOLIN HFA) 108 (90 Base) MCG/ACT inhaler INHALE 2 PUFFS BY MOUTH EVERY 6 HOURS AS NEEDED FOR WHEEZING 06/04/21     aspirin EC 325 MG tablet Take 1 tablet (325 mg total) by mouth daily. 05/29/21 05/29/22  Rudene Re, MD  budesonide-formoterol [facility]) 160-4.5 MCG/ACT inhaler INHALE 2 PUFFS BY MOUTH 2 TIMES DAILY 12/20/20 12/20/21    diltiazem (CARDIZEM CD) 180 MG 24 hr capsule Take 1 capsule (180 mg total) by mouth daily. 05/29/21  05/29/22  Rudene Re, MD  magnesium citrate SOLN Take 296 mLs (1 Bottle total) by mouth daily as needed for severe constipation. 05/29/21   Carrie Mew, MD  meloxicam (MOBIC) 15 MG tablet Take 1 tablet (15 mg total) by mouth daily. 07/03/21 07/03/22  Fisher, Linden Dolin, PA-C  olopatadine (PATANOL) 0.1 % ophthalmic solution Place 1 drop into both eyes 2 (two) times daily 05/20/21     omeprazole (PRILOSEC) 20 MG capsule Take 1 capsule (20 mg total) by mouth 2 (two) times daily 06/21/21     predniSONE (DELTASONE) 10 MG tablet Take 1 tablet (10 mg total) by mouth once daily 3,3,3,2,2,2,1,1,1 and stop 07/24/21     predniSONE (DELTASONE) 20 MG tablet Take 1 tablet (20 mg total) by mouth once daily 07/17/21     prochlorperazine (COMPAZINE) 10 MG tablet Take 1 tablet (10 mg total) by mouth every 6 (six) hours as needed for nausea (headache). 07/26/21   Paulette Blanch, MD  sucralfate (CARAFATE) 1 g tablet Take 1 tablet (1 g total) by mouth 4 (four) times daily for 5 days. 06/22/21 07/15/21  Lucrezia Starch, MD  tobramycin (TOBREX) 0.3 % ophthalmic solution Place 2 drops into the left eye every 4 (four) hours. 06/28/18   Johnn Hai, PA-C     Allergies  Amoxicillin and Penicillins   Family History  History reviewed. No pertinent family history.   Physical Exam  Triage Vital Signs: ED  Triage Vitals  Enc Vitals Group     BP 09/08/21 2300 140/85     Pulse Rate 09/08/21 2300 (!) 58     Resp 09/08/21 2300 18     Temp 09/08/21 2300 98.6 F (37 C)     Temp Source 09/08/21 2300 Oral     SpO2 09/08/21 2300 97 %     Weight 09/08/21 2301 205 lb (93 kg)     Height 09/08/21 2301 6\' 1"  (1.854 m)     Head Circumference --      Peak Flow --      Pain Score 09/08/21 2301 9     Pain Loc --      Pain Edu? --      Excl. in [place]? --     Updated Vital Signs: BP 137/79    Pulse (!) 55    Temp 98.6 F (37 C) (Oral)    Resp 16    Ht 6\' 1"  (1.854 m)    Wt 93 kg    SpO2 99%    BMI 27.05 kg/m     General: Awake, no distress.  CV:  RRR.  Good peripheral perfusion.  Resp:  Normal effort.  CTAB.  Anterior left chest and right sided pain on movement of trunk and lifting left arm. Abd:  Nontender to light or deep palpation.  No distention.  Other:  No calf swelling or tenderness.   ED Results / Procedures / Treatments  Labs (all labs ordered are listed, but only abnormal results are displayed) Labs Reviewed  HEPATIC FUNCTION PANEL - Abnormal; Notable for the following components:      Result Value   ALT 45 (*)    All other components within normal limits  BASIC METABOLIC PANEL  CBC  LIPASE, BLOOD  TROPONIN I (HIGH SENSITIVITY)  TROPONIN I (HIGH SENSITIVITY)     EKG  ED ECG REPORT I, Dasani Crear J, the attending physician, personally viewed and interpreted this ECG.   Date: 09/09/2021  EKG Time: 2256  Rate: 54  Rhythm: sinus bradycardia  Axis: Normal  Intervals:none  ST&T Change: Nonspecific    RADIOLOGY I have personally reviewed patient's chest x-ray as well as the radiology interpretation:  Chest x-ray: No acute cardiopulmonary process  Official radiology report(s): DG Chest 2 View  Result Date: 09/09/2021 CLINICAL DATA:  Chest pain. EXAM: CHEST - 2 VIEW COMPARISON:  Chest radiograph dated 06/26/2021. FINDINGS: The heart size and mediastinal contours are within normal limits. Both lungs are clear. The visualized skeletal structures are unremarkable. IMPRESSION: No active cardiopulmonary disease. Electronically Signed   By: Anner Crete M.D.   On: 09/09/2021 00:16     PROCEDURES:  Critical Care performed: No  .1-3 Lead EKG Interpretation Performed by: Paulette Blanch, MD Authorized by: Paulette Blanch, MD     Interpretation: normal     ECG rate:  55   ECG rate assessment: bradycardic     Rhythm: sinus bradycardia     Ectopy: none     Conduction: normal   Comments:     Patient placed on cardiac monitor to evaluate for arrhythmias   MEDICATIONS  ORDERED IN ED: Medications  ketorolac (TORADOL) 30 MG/ML injection 15 mg (15 mg Intravenous Given 09/09/21 0151)     IMPRESSION / MDM / ASSESSMENT AND PLAN / ED COURSE  I reviewed the triage vital signs and the nursing notes.  46 year old male presenting with chest pain x 3 days. Differential diagnosis includes, but is not limited to, ACS, aortic dissection, pulmonary embolism, cardiac tamponade, pneumothorax, pneumonia, pericarditis, myocarditis, GI-related causes including esophagitis/gastritis, and musculoskeletal chest wall pain.   I have personally reviewed patient's record and see an office visit from 07/16/2021 where he has documented echocardiogram with EF 60-65%.  The patient is on the cardiac monitor to evaluate for evidence of arrhythmia and/or significant heart rate changes.  Laboratory results demonstrate normal WBC, normal electrolytes, initial troponin negative, normal LFTs/lipase.  Chest x-ray negative for acute process.  Low suspicion for PE.  Will administer IV ketorolac for most likely muscular skeletal chest pain; awaiting repeat troponin.  Will reassess.  Clinical Course as of 09/09/21 Daleen Squibb Sep 09, 2021  0220 Patient resting in no acute distress.  Updated him of repeat negative troponin.  Will discharge home with as needed prescription for NSAIDs, analgesia and advised moist heat.  Patient will follow up with his PCP closely.  Strict return precautions given.  Patient verbalizes understanding and agrees with plan of care. [JS]    Clinical Course User Index [JS] Paulette Blanch, MD     FINAL CLINICAL IMPRESSION(S) / ED DIAGNOSES   Final diagnoses:  Nonspecific chest pain     Rx / DC Orders   ED Discharge Orders          Ordered    naproxen (NAPROSYN) 500 MG tablet  2 times daily with meals        09/09/21 0222    HYDROcodone-acetaminophen (NORCO) 5-325 MG tablet  Every 6 hours PRN        09/09/21 0222             Note:   This document was prepared using Dragon voice recognition software and may include unintentional dictation errors.   Paulette Blanch, MD 09/09/21 Delrae Rend

## 2021-09-09 NOTE — Discharge Instructions (Signed)
You may take pain medicines as needed (Naprosyn/Norco). Do not take Naprosyn if you are taking Meloxicam. Apply moist heat to affected area several times daily as needed for discomfort. Return to the ER for worsening symptoms, persistent vomiting, difficulty breathing or other concerns.

## 2021-09-12 ENCOUNTER — Other Ambulatory Visit: Payer: Self-pay

## 2021-09-12 DIAGNOSIS — I480 Paroxysmal atrial fibrillation: Secondary | ICD-10-CM | POA: Diagnosis not present

## 2021-09-12 DIAGNOSIS — Z09 Encounter for follow-up examination after completed treatment for conditions other than malignant neoplasm: Secondary | ICD-10-CM | POA: Diagnosis not present

## 2021-09-12 DIAGNOSIS — R0789 Other chest pain: Secondary | ICD-10-CM | POA: Diagnosis not present

## 2021-09-12 MED ORDER — PREDNISONE 10 MG PO TABS
ORAL_TABLET | ORAL | 0 refills | Status: DC
  Filled 2021-09-12: qty 18, 9d supply, fill #0

## 2021-09-24 ENCOUNTER — Other Ambulatory Visit: Payer: Self-pay

## 2021-09-24 DIAGNOSIS — J45909 Unspecified asthma, uncomplicated: Secondary | ICD-10-CM | POA: Diagnosis not present

## 2021-09-24 DIAGNOSIS — Z0000 Encounter for general adult medical examination without abnormal findings: Secondary | ICD-10-CM | POA: Diagnosis not present

## 2021-09-24 DIAGNOSIS — I480 Paroxysmal atrial fibrillation: Secondary | ICD-10-CM | POA: Diagnosis not present

## 2021-09-24 DIAGNOSIS — R519 Headache, unspecified: Secondary | ICD-10-CM | POA: Diagnosis not present

## 2021-09-24 DIAGNOSIS — F411 Generalized anxiety disorder: Secondary | ICD-10-CM | POA: Diagnosis not present

## 2021-09-24 DIAGNOSIS — Z1331 Encounter for screening for depression: Secondary | ICD-10-CM | POA: Diagnosis not present

## 2021-09-24 MED ORDER — MELOXICAM 15 MG PO TABS
ORAL_TABLET | ORAL | 3 refills | Status: DC
  Filled 2021-09-24: qty 30, 30d supply, fill #0

## 2021-09-24 MED ORDER — BACLOFEN 10 MG PO TABS
ORAL_TABLET | ORAL | 1 refills | Status: DC
  Filled 2021-09-24: qty 45, 30d supply, fill #0

## 2021-09-24 MED ORDER — TOPIRAMATE 25 MG PO TABS
25.0000 mg | ORAL_TABLET | Freq: Every day | ORAL | 5 refills | Status: DC
  Filled 2021-09-24: qty 30, 30d supply, fill #0

## 2021-10-16 ENCOUNTER — Other Ambulatory Visit: Payer: Self-pay

## 2021-10-23 DIAGNOSIS — H52223 Regular astigmatism, bilateral: Secondary | ICD-10-CM | POA: Diagnosis not present

## 2021-10-24 ENCOUNTER — Other Ambulatory Visit: Payer: Self-pay

## 2021-10-27 ENCOUNTER — Emergency Department: Payer: 59

## 2021-10-27 ENCOUNTER — Other Ambulatory Visit: Payer: Self-pay

## 2021-10-27 ENCOUNTER — Emergency Department
Admission: EM | Admit: 2021-10-27 | Discharge: 2021-10-27 | Disposition: A | Discharge status: Home / Self Care | Payer: 59 | Attending: Emergency Medicine | Admitting: Emergency Medicine

## 2021-10-27 DIAGNOSIS — R079 Chest pain, unspecified: Secondary | ICD-10-CM | POA: Diagnosis not present

## 2021-10-27 DIAGNOSIS — I4891 Unspecified atrial fibrillation: Secondary | ICD-10-CM | POA: Diagnosis not present

## 2021-10-27 DIAGNOSIS — X500XXA Overexertion from strenuous movement or load, initial encounter: Secondary | ICD-10-CM | POA: Insufficient documentation

## 2021-10-27 DIAGNOSIS — R0789 Other chest pain: Secondary | ICD-10-CM | POA: Insufficient documentation

## 2021-10-27 DIAGNOSIS — I480 Paroxysmal atrial fibrillation: Secondary | ICD-10-CM | POA: Insufficient documentation

## 2021-10-27 LAB — CBC
HCT: 47.1 % (ref 39.0–52.0)
Hemoglobin: 15.8 g/dL (ref 13.0–17.0)
MCH: 29 pg (ref 26.0–34.0)
MCHC: 33.5 g/dL (ref 30.0–36.0)
MCV: 86.4 fL (ref 80.0–100.0)
Platelets: 290 10*3/uL (ref 150–400)
RBC: 5.45 MIL/uL (ref 4.22–5.81)
RDW: 13.1 % (ref 11.5–15.5)
WBC: 7.2 10*3/uL (ref 4.0–10.5)
nRBC: 0 % (ref 0.0–0.2)

## 2021-10-27 LAB — D-DIMER, QUANTITATIVE: D-Dimer, Quant: <0.27 ug/mL-FEU (ref 0.00–0.50)

## 2021-10-27 LAB — BASIC METABOLIC PANEL
Anion gap: 7 (ref 5–15)
BUN: 13 mg/dL (ref 6–20)
CO2: 27 mmol/L (ref 22–32)
Calcium: 9.3 mg/dL (ref 8.9–10.3)
Chloride: 104 mmol/L (ref 98–111)
Creatinine, Ser: 1.21 mg/dL (ref 0.61–1.24)
GFR, Estimated: >60 mL/min (ref >60)
Glucose, Bld: 135 mg/dL — ABNORMAL HIGH (ref 70–99)
Potassium: 3.9 mmol/L (ref 3.5–5.1)
Sodium: 138 mmol/L (ref 135–145)

## 2021-10-27 LAB — TROPONIN I (HIGH SENSITIVITY)
Troponin I (High Sensitivity): 5 ng/L (ref <18)
Troponin I (High Sensitivity): 4 ng/L (ref <18)

## 2021-10-27 IMAGING — CR DG CHEST 2V
2 series · 2 of 2 positions shown · non-contrast
Comparison: 11/07/2016

CLINICAL DATA: Chest tightness

EXAM:
CHEST - 2 VIEW

[chest pa]
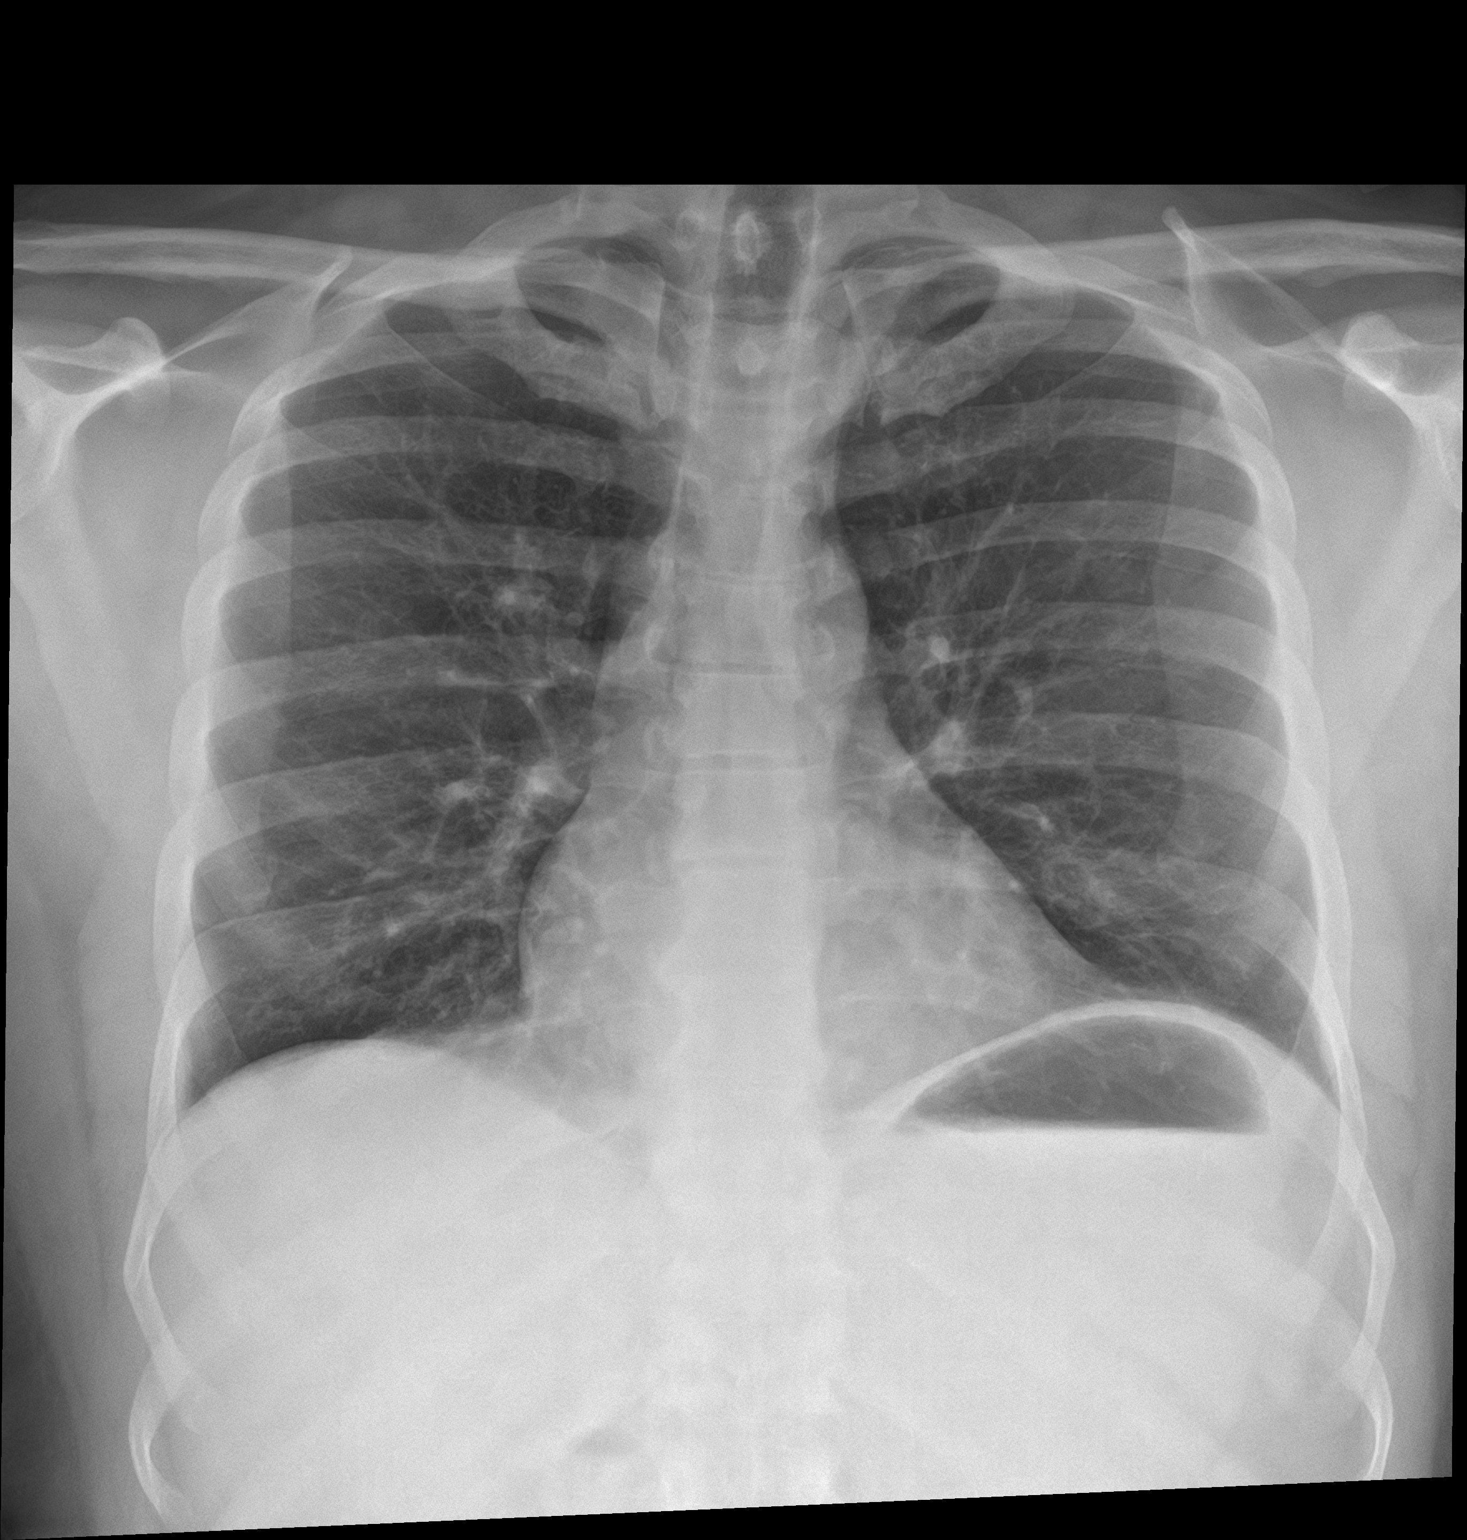

[chest lat]
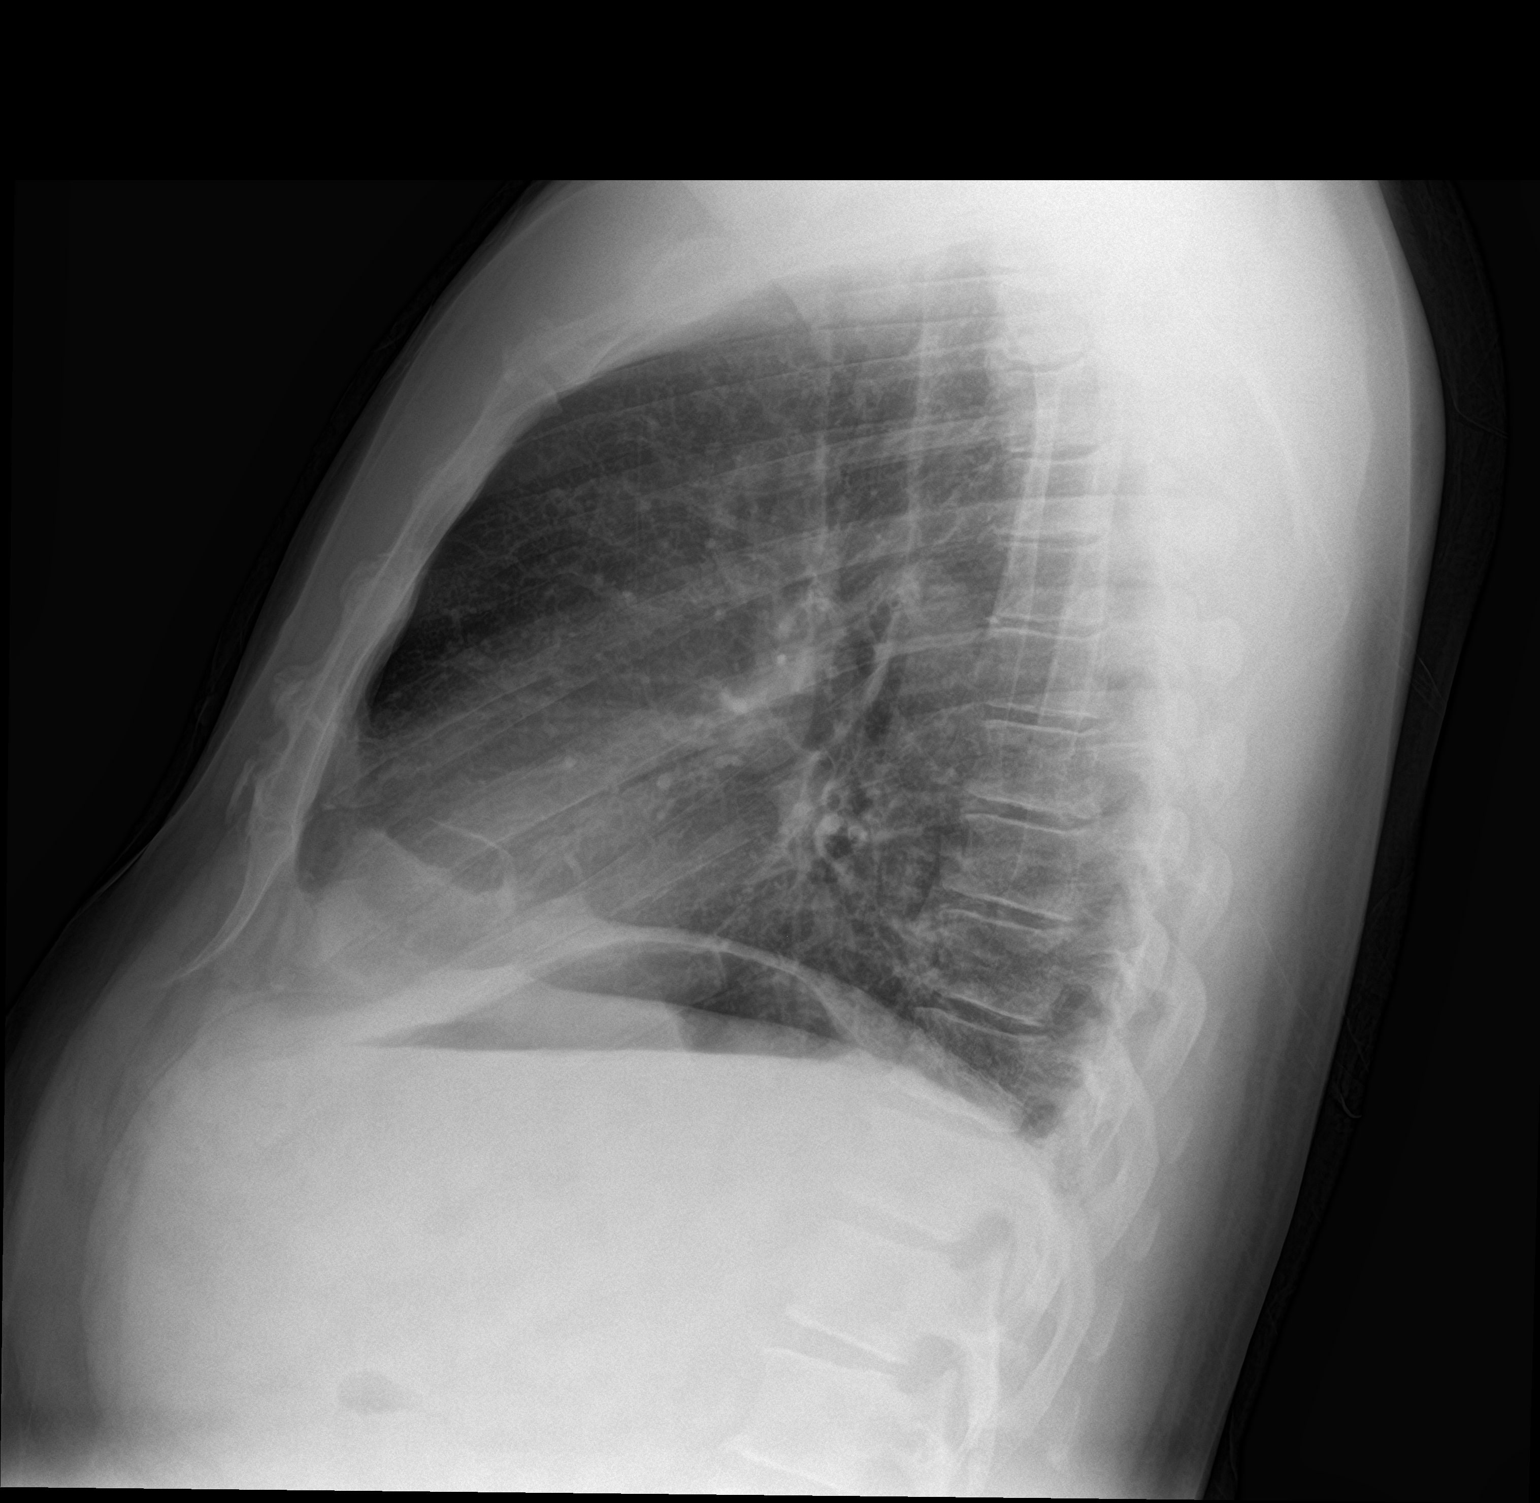

[2 of 2 positions shown; findings below may reference images not displayed]

FINDINGS: Cardiac shadow is stable. Lungs are well aerated bilaterally. No
focal infiltrate or sizable effusion is seen. No acute bony
abnormality is noted.
IMPRESSION: No active cardiopulmonary disease.

## 2021-10-27 NOTE — ED Triage Notes (Signed)
Pt states left sided chest pain. Pt states history of a fib but is not on blood thinners at this time. Pt states pain began around lunch time. Pt appears in no acute distress.  ?

## 2021-10-27 NOTE — ED Provider Notes (Signed)
? ?[facility] ?Provider Note ? ? ? None  ?  (approximate) ? ? ?History  ? ?Chest Pain ? ? ?HPI ? ?Spencer Tanner is a 46 y.o. male with history of paroxysmal A-fib not on anticoagulation who comes in with concerns for chest discomfort.  Patient reports intermittent chest pain in his right upper chest.  Patient does report that he has been working a lot more he does a lot of heavy lifting so is not sure if it is related to that but he denies any shortness of breath.  The pain is a stabbing sensation.  Denies any swelling in his legs.  He went to make sure that it was not related to his heart.  Denies any sick contacts ? ? ?Physical Exam  ? ?Triage Vital Signs: ?ED Triage Vitals [10/27/21 2107]  ?Enc Vitals Group  ?   BP 124/73  ?   Pulse Rate 65  ?   Resp 16  ?   Temp 99.1 ?F (37.3 ?C)  ?   Temp src   ?   SpO2 94 %  ?   Weight 202 lb (91.6 kg)  ?   Height 6' (1.829 m)  ?   Head Circumference   ?   Peak Flow   ?   Pain Score 8  ?   Pain Loc   ?   Pain Edu?   ?   Excl. in GC?   ? ? ?Most recent vital signs: ?Vitals:  ? 10/27/21 2107  ?BP: 124/73  ?Pulse: 65  ?Resp: 16  ?Temp: 99.1 ?F (37.3 ?C)  ?SpO2: 94%  ? ? ? ?General: Awake, no distress.  ?CV:  Good peripheral perfusion.  No chest wall tenderness.  No rash noted ?Resp:  Normal effort.  Clear lungs ?Abd:  No distention.  ?Other:   ? ? ?ED Results / Procedures / Treatments  ? ?Labs ?(all labs ordered are listed, but only abnormal results are displayed) ?Labs Reviewed  ?BASIC METABOLIC PANEL - Abnormal; Notable for the following components:  ?    Result Value  ? Glucose, Bld 135 (*)   ? All other components within normal limits  ?CBC  ?TROPONIN I (HIGH SENSITIVITY)  ? ? ? ?EKG ? ?My interpretation of EKG: ? ?Normal sinus rate of 61 without any ST elevation or T wave inversions, normal intervals ? ?RADIOLOGY ?I have reviewed the xray personally and no pneumonia ? ?PROCEDURES: ? ?Critical Care performed: No ? ?.1-3 Lead EKG  Interpretation ?Performed by: Concha Se, MD ?Authorized by: Concha Se, MD  ? ?  Interpretation: normal   ?  ECG rate:  60 ?  ECG rate assessment: normal   ?  Rhythm: sinus rhythm   ?  Ectopy: none   ?  Conduction: normal   ? ? ?MEDICATIONS ORDERED IN ED: ?Medications - No data to display ? ? ?IMPRESSION / MDM / ASSESSMENT AND PLAN / ED COURSE  ?I reviewed the triage vital signs and the nursing notes. ?             ?               ? ?Differential diagnosis includes, but is not limited to, ACS, pneumothorax.  Patient is PERC negative although his oxygen levels are slightly low at 94%.  We will get D-dimer and recheck these but suspect it could have just been an error but given the continued intermittent chest pain we will get D-dimer.  Patient denies any pain at this time ? ? ?Initial troponin is negative.  CBC is reassuring.  BMP is reassuring ? ? ?Patient will be handed off pending the D-dimer and repeat troponin and most likely dispo home with cardiology follow-up ? ?The patient is on the cardiac monitor to evaluate for evidence of arrhythmia and/or significant heart rate changes. ? ? ? ?FINAL CLINICAL IMPRESSION(S) / ED DIAGNOSES  ? ?Final diagnoses:  ?Atypical chest pain  ? ? ? ?Rx / DC Orders  ? ?ED Discharge Orders   ? ? None  ? ?  ? ? ? ?Note:  This document was prepared using Dragon voice recognition software and may include unintentional dictation errors. ?  ?Concha Se, MD ?10/27/21 2253 ? ?

## 2021-10-29 ENCOUNTER — Other Ambulatory Visit: Payer: Self-pay

## 2021-10-29 DIAGNOSIS — R42 Dizziness and giddiness: Secondary | ICD-10-CM | POA: Diagnosis not present

## 2021-10-29 DIAGNOSIS — G44209 Tension-type headache, unspecified, not intractable: Secondary | ICD-10-CM | POA: Diagnosis not present

## 2021-10-29 MED ORDER — NORTRIPTYLINE HCL 10 MG PO CAPS
ORAL_CAPSULE | ORAL | 3 refills | Status: DC
  Filled 2021-10-29: qty 60, 30d supply, fill #0

## 2021-11-01 ENCOUNTER — Ambulatory Visit: Payer: 59 | Admitting: Cardiology

## 2021-11-04 ENCOUNTER — Encounter: Payer: Self-pay | Admitting: Cardiology

## 2021-11-05 NOTE — Progress Notes (Signed)
? ?Cardiology Office Note   ? ?Date:  11/06/2021  ? ?ID:  Spencer Tanner, DOB October 18, 1975, MRN [id number] ? ?PCP:  Barbette ReichmannHande, Vishwanath, MD  ?Cardiologist:  Debbe OdeaBrian Agbor-Etang, MD  ?Electrophysiologist:  None  ? ?Chief Complaint: ED follow up ? ?History of Present Illness:  ? ?Spencer Tanner is a 46 y.o. male with history of normal coronary arteries by coronary CTA in 05/2021, PAF, HTN, and headaches who presents for ED follow up.  ? ?He was seen in the ED on 05/29/2021 with chest pain.  He was noted to be an newly documented A-fib with RVR.  High-sensitivity troponin and D-dimer negative.  He was started on Cardizem.  He established care with our office on 05/31/2021 and was noted to be in sinus rhythm.  Subsequent Zio patch in 05/2021 showed no evidence of Afib/flutter with a predominant rhythm of sinus with an average rate of 75 bpm (range 37-139 bpm), 2nd degree AV block type I was noted, junctional rhythm was present, rare PACs, atrial couplet, triplets, PVCs, ventricular couplets and triplets.  Coronary CTA on 06/13/2021 showed a calcium score of 0 and was without evidence of CAD.  He was seen in the ED in 06/2021 three times, initially with elevated BP, 2nd time for atypical chest pain, and a third time for headache. High sensitivity troponin was negative x 2.  Echo in 06/2021 showed an EF of 60-65%, no RWMA, normal LV diastolic function parameters, normal RV systolic function and ventricular cavity size, and no significant valvular abnormalities.  He was seen in the ED in 08/2021 with left sided chest and right upper quadrant pain. High sensitivity troponin was negative x 2.  He was treated with ketorolac. ? ?He was seen in the ED on 10/27/2021 with right upper chest discomfort.  He reported having done a lot of lifting, though was unsure if this was contributing.  Work up was reassuring including negative high sensitivity troponin x 2, normal d-dimer, and unremarkable CBC.  Outpatient follow-up was  recommended. ? ?During each of the above ED visits, he has been documented to be in sinus rhythm. ? ?He comes in today doing well from a cardiac perspective.  He notes since he was diagnosed with A-fib, he has been more in tune with his body.  He reports intermittent episodes of brief several second lasting chest discomfort or chest burning.  It is during these episodes when he presents to the ED to ensure he is doing okay.  He has been without symptoms of prolonged chest discomfort or angina.  No exertional symptoms.  He will also occasionally note a fleeting palpitation that does not always occur with his chest discomfort.  Symptoms do not feel similar to his prior A-fib.  Lastly, he does feel a little fatigued/lightheaded on the current dose of Cardizem CD.  He has been without symptoms of dizziness, presyncope, or syncope.  No lower extremity swelling, abdominal distention, orthopnea, PND, early satiety.  It was noted he was under increased stress and drinking large amounts of caffeine back in the fall 2022 when he was diagnosed with A-fib.  He has since tried to minimize his stress and is drinking less caffeine. ? ? ?Labs independently reviewed: ?10/2021 - HGB 15.8, PLT 290, potassium 3.9, BUN 13, SCr 1.21 ?08/2021 - albumin 3.9, AST normal, ALT 45 ?05/2021 - TSH normal, magnesium 2.0 ?01/2019 - TC 192, TG 75, HDL 61, LDL 116 ? ?Past Medical History:  ?Diagnosis Date  ? Asthma   ? Hypertension   ? ? ?  History reviewed. No pertinent surgical history. ? ?Current Medications: ?Current Meds  ?Medication Sig  ? albuterol (VENTOLIN HFA) 108 (90 Base) MCG/ACT inhaler INHALE 2 PUFFS BY MOUTH EVERY 6 HOURS AS NEEDED FOR WHEEZING  ? aspirin EC 325 MG tablet Take 1 tablet (325 mg total) by mouth daily.  ? diltiazem (CARDIZEM CD) 120 MG 24 hr capsule Take 1 capsule (120 mg total) by mouth daily.  ? [DISCONTINUED] diltiazem (CARDIZEM CD) 180 MG 24 hr capsule Take 1 capsule (180 mg total) by mouth daily.  ? ? ?Allergies:    Amoxicillin and Penicillins  ? ?Social History  ? ?Socioeconomic History  ? Marital status: Single  ?  Spouse name: Not on file  ? Number of children: Not on file  ? Years of education: Not on file  ? Highest education level: Not on file  ?Occupational History  ? Not on file  ?Tobacco Use  ? Smoking status: Former  ? Smokeless tobacco: Never  ?Vaping Use  ? Vaping Use: Never used  ?Substance and Sexual Activity  ? Alcohol use: No  ?  Alcohol/week: 0.0 standard drinks  ? Drug use: No  ? Sexual activity: Not on file  ?Other Topics Concern  ? Not on file  ?Social History Narrative  ? Not on file  ? ?Social Determinants of Health  ? ?Financial Resource Strain: Not on file  ?Food Insecurity: Not on file  ?Transportation Needs: Not on file  ?Physical Activity: Not on file  ?Stress: Not on file  ?Social Connections: Not on file  ?  ? ?Family History:  ?The patient's family history is not on file. ? ?ROS:   ?12-point review of systems is negative as otherwise noted in the HPI. ? ? ?EKGs/Labs/Other Studies Reviewed:   ? ?Studies reviewed were summarized above. The additional studies were reviewed today: ? ?2D echo 07/08/2021: ?1. Left ventricular ejection fraction, by estimation, is 60 to 65%. The  ?left ventricle has normal function. The left ventricle has no regional  ?wall motion abnormalities. Left ventricular diastolic parameters were  ?normal. The average left ventricular  ?global longitudinal strain is -17.6 %. The global longitudinal strain is  ?normal.  ? 2. Right ventricular systolic function is normal. The right ventricular  ?size is normal.  ? 3. The mitral valve is normal in structure. No evidence of mitral valve  ?regurgitation.  ? 4. The aortic valve is tricuspid. Aortic valve regurgitation is not  ?visualized.  ? 5. The inferior vena cava is normal in size with greater than 50%  ?respiratory variability, suggesting right atrial pressure of 3 mmHg. ?__________ ? ?Zio patch 05/2021: ?Patient had a min HR of 37  bpm, max HR of 139 bpm, and avg HR of 75 bpm. Predominant underlying rhythm was Sinus Rhythm. Second Degree AV Block-Mobitz I (Wenckebach) was present. Junctional Rhythm was present. Isolated SVEs were rare (<1.0%), SVE  ?Couplets were rare (<1.0%), and SVE Triplets were rare (<1.0%). Isolated VEs were rare (<1.0%, 58), VE Couplets were rare (<1.0%, 2), and VE Triplets were rare (<1.0%, 1).  ?Benign cardiac monitor, no significant arrhythmias, no evidence of atrial fibrillation or atrial flutter. ?__________ ? ?Coronary CTA 06/13/2021: ?FINDINGS: ?Aorta:  Normal size.  No calcifications.  No dissection. ?  ?Aortic Valve:  Trileaflet.  No calcifications. ?  ?Coronary Arteries:  Normal coronary origin.  Right dominance. ?  ?RCA is a dominant artery that gives rise to PDA and PLA. There is no ?plaque. ?  ?Left main is a  large artery that gives rise to LAD and LCX arteries. ?LM has no disease. ?  ?LAD has no plaque. ?  ?LCX is a non-dominant artery that gives rise to two obtuse marginal ?branches. There is no plaque. ?  ?Other findings: ?  ?Normal pulmonary vein drainage into the left atrium. ?  ?Normal left atrial appendage without a thrombus. ?  ?Normal size of the pulmonary artery. ?  ?IMPRESSION: ?1. Coronary calcium score of 0. Patient is low risk for coronary ?events. ?  ?2. Normal coronary origin with right dominance. ?  ?3. No evidence of CAD. ?  ?4. CAD-RADS 0. No evidence of CAD (0%). Consider non-atherosclerotic ?causes of chest pain. ? ? ? ?EKG:  EKG is not ordered today.  Recent ED EKG reviewed. ? ?Recent Labs: ?05/29/2021: Magnesium 2.4; TSH 2.111 ?09/08/2021: ALT 45 ?10/27/2021: BUN 13; Creatinine, Ser 1.21; Hemoglobin 15.8; Platelets 290; Potassium 3.9; Sodium 138  ?Recent Lipid Panel ?No results found for: CHOL, TRIG, HDL, CHOLHDL, VLDL, LDLCALC, LDLDIRECT ? ?PHYSICAL EXAM:   ? ?VS:  BP 140/80 (BP Location: Left Arm, Patient Position: Sitting, Cuff Size: Normal)   Pulse 73   Ht 6\' 1"  (1.854 m)   Wt 207  lb 8 oz (94.1 kg)   SpO2 98%   BMI 27.38 kg/m?   BMI: Body mass index is 27.38 kg/m?. ? ?Physical Exam ?Vitals reviewed.  ?Constitutional:   ?   Appearance: He is well-developed.  ?HENT:  ?   Head: Normocepha

## 2021-11-06 ENCOUNTER — Ambulatory Visit (INDEPENDENT_AMBULATORY_CARE_PROVIDER_SITE_OTHER): Payer: 59 | Admitting: Physician Assistant

## 2021-11-06 ENCOUNTER — Other Ambulatory Visit: Payer: Self-pay

## 2021-11-06 ENCOUNTER — Encounter: Payer: Self-pay | Admitting: Physician Assistant

## 2021-11-06 VITALS — BP 140/80 | HR 73 | Ht 73.0 in | Wt 207.5 lb

## 2021-11-06 DIAGNOSIS — I4891 Unspecified atrial fibrillation: Secondary | ICD-10-CM

## 2021-11-06 DIAGNOSIS — I10 Essential (primary) hypertension: Secondary | ICD-10-CM | POA: Diagnosis not present

## 2021-11-06 DIAGNOSIS — R0789 Other chest pain: Secondary | ICD-10-CM | POA: Diagnosis not present

## 2021-11-06 MED ORDER — DILTIAZEM HCL ER COATED BEADS 120 MG PO CP24
120.0000 mg | ORAL_CAPSULE | Freq: Every day | ORAL | 3 refills | Status: DC
  Filled 2021-11-06: qty 90, 90d supply, fill #0

## 2021-11-06 NOTE — Patient Instructions (Signed)
Medication Instructions:  ?Your physician has recommended you make the following change in your medication:  ? ?STOP Cardizem 180 mg ?START Cardizem 120 mg once daily ? ?*If you need a refill on your cardiac medications before your next appointment, please call your pharmacy* ? ? ?Lab Work: ?None ? ?If you have labs (blood work) drawn today and your tests are completely normal, you will receive your results only by: ?MyChart Message (if you have MyChart) OR ?A paper copy in the mail ?If you have any lab test that is abnormal or we need to change your treatment, we will call you to review the results. ? ? ?Testing/Procedures: ?None ? ? ?Follow-Up: ?At [facility], you and your health needs are our priority.  As part of our continuing mission to provide you with exceptional heart care, we have created designated Provider Care Teams.  These Care Teams include your primary Cardiologist (physician) and Advanced Practice Providers (APPs -  Physician Assistants and Nurse Practitioners) who all work together to provide you with the care you need, when you need it. ? ? ?Your next appointment:   ?2 month(s) ? ?The format for your next appointment:   ?In Person ? ?Provider:   ?Debbe Odea, MD or Eula Listen, PA-C ?

## 2021-11-07 ENCOUNTER — Other Ambulatory Visit (HOSPITAL_COMMUNITY): Payer: Self-pay | Admitting: Physician Assistant

## 2021-11-07 ENCOUNTER — Other Ambulatory Visit: Payer: Self-pay | Admitting: Physician Assistant

## 2021-11-07 DIAGNOSIS — R519 Headache, unspecified: Secondary | ICD-10-CM

## 2021-11-07 DIAGNOSIS — R42 Dizziness and giddiness: Secondary | ICD-10-CM

## 2021-11-07 DIAGNOSIS — R930 Abnormal findings on diagnostic imaging of skull and head, not elsewhere classified: Secondary | ICD-10-CM

## 2021-11-16 ENCOUNTER — Ambulatory Visit
Admission: RE | Admit: 2021-11-16 | Discharge: 2021-11-16 | Disposition: A | Discharge status: Home / Self Care | Payer: 59 | Source: Ambulatory Visit | Attending: Physician Assistant | Admitting: Physician Assistant

## 2021-11-16 DIAGNOSIS — R42 Dizziness and giddiness: Secondary | ICD-10-CM | POA: Diagnosis not present

## 2021-11-16 DIAGNOSIS — R519 Headache, unspecified: Secondary | ICD-10-CM | POA: Insufficient documentation

## 2021-11-16 DIAGNOSIS — R930 Abnormal findings on diagnostic imaging of skull and head, not elsewhere classified: Secondary | ICD-10-CM | POA: Diagnosis not present

## 2021-11-16 MED ORDER — GADOBUTROL 1 MMOL/ML IV SOLN
9.0000 mL | Freq: Once | INTRAVENOUS | Status: AC | PRN
  Administered 2021-11-16: 10 mL via INTRAVENOUS

## 2021-11-29 ENCOUNTER — Other Ambulatory Visit: Payer: Self-pay

## 2021-12-02 ENCOUNTER — Other Ambulatory Visit: Payer: Self-pay

## 2021-12-02 MED ORDER — DILTIAZEM HCL ER COATED BEADS 180 MG PO CP24
ORAL_CAPSULE | ORAL | 0 refills | Status: DC
  Filled 2021-12-02: qty 90, 90d supply, fill #0

## 2021-12-10 ENCOUNTER — Other Ambulatory Visit: Payer: Self-pay

## 2021-12-10 DIAGNOSIS — R42 Dizziness and giddiness: Secondary | ICD-10-CM | POA: Diagnosis not present

## 2021-12-10 DIAGNOSIS — G44209 Tension-type headache, unspecified, not intractable: Secondary | ICD-10-CM | POA: Diagnosis not present

## 2021-12-10 DIAGNOSIS — R519 Headache, unspecified: Secondary | ICD-10-CM | POA: Diagnosis not present

## 2021-12-10 MED ORDER — VENLAFAXINE HCL 37.5 MG PO TABS
ORAL_TABLET | ORAL | 3 refills | Status: DC
  Filled 2021-12-10: qty 60, 30d supply, fill #0

## 2021-12-19 ENCOUNTER — Emergency Department: Payer: 59

## 2021-12-19 ENCOUNTER — Emergency Department
Admission: EM | Admit: 2021-12-19 | Discharge: 2021-12-19 | Discharge status: Against Medical Advice | Payer: 59 | Attending: Emergency Medicine | Admitting: Emergency Medicine

## 2021-12-19 ENCOUNTER — Other Ambulatory Visit: Payer: Self-pay

## 2021-12-19 DIAGNOSIS — Z5321 Procedure and treatment not carried out due to patient leaving prior to being seen by health care provider: Secondary | ICD-10-CM | POA: Insufficient documentation

## 2021-12-19 DIAGNOSIS — I10 Essential (primary) hypertension: Secondary | ICD-10-CM | POA: Diagnosis not present

## 2021-12-19 DIAGNOSIS — R079 Chest pain, unspecified: Secondary | ICD-10-CM | POA: Insufficient documentation

## 2021-12-19 DIAGNOSIS — R0789 Other chest pain: Secondary | ICD-10-CM | POA: Diagnosis not present

## 2021-12-19 LAB — BASIC METABOLIC PANEL
Anion gap: 8 (ref 5–15)
BUN: 10 mg/dL (ref 6–20)
CO2: 28 mmol/L (ref 22–32)
Calcium: 9.7 mg/dL (ref 8.9–10.3)
Chloride: 104 mmol/L (ref 98–111)
Creatinine, Ser: 1.24 mg/dL (ref 0.61–1.24)
GFR, Estimated: >60 mL/min (ref >60)
Glucose, Bld: 111 mg/dL — ABNORMAL HIGH (ref 70–99)
Potassium: 4.5 mmol/L (ref 3.5–5.1)
Sodium: 140 mmol/L (ref 135–145)

## 2021-12-19 LAB — CBC WITH DIFFERENTIAL/PLATELET
Abs Immature Granulocytes: 0.01 10*3/uL (ref 0.00–0.07)
Basophils Absolute: 0.1 10*3/uL (ref 0.0–0.1)
Basophils Relative: 1 %
Eosinophils Absolute: 0.5 10*3/uL (ref 0.0–0.5)
Eosinophils Relative: 7 %
HCT: 51.2 % (ref 39.0–52.0)
Hemoglobin: 17 g/dL (ref 13.0–17.0)
Immature Granulocytes: 0 %
Lymphocytes Relative: 37 %
Lymphs Abs: 2.5 10*3/uL (ref 0.7–4.0)
MCH: 28.7 pg (ref 26.0–34.0)
MCHC: 33.2 g/dL (ref 30.0–36.0)
MCV: 86.3 fL (ref 80.0–100.0)
Monocytes Absolute: 0.5 10*3/uL (ref 0.1–1.0)
Monocytes Relative: 7 %
Neutro Abs: 3.2 10*3/uL (ref 1.7–7.7)
Neutrophils Relative %: 48 %
Platelets: 307 10*3/uL (ref 150–400)
RBC: 5.93 MIL/uL — ABNORMAL HIGH (ref 4.22–5.81)
RDW: 13.4 % (ref 11.5–15.5)
WBC: 6.8 10*3/uL (ref 4.0–10.5)
nRBC: 0 % (ref 0.0–0.2)

## 2021-12-19 LAB — TROPONIN I (HIGH SENSITIVITY)
Troponin I (High Sensitivity): 5 ng/L (ref <18)
Troponin I (High Sensitivity): 6 ng/L (ref <18)

## 2021-12-19 NOTE — ED Provider Triage Note (Signed)
?  Emergency Medicine Provider Triage Evaluation Note ? ?Spencer Tanner , a 46 y.o.male,  was evaluated in triage.  Pt complains of chest pain.  Patient states that it is predominantly left-sided, started this morning, has mildly improved over the course of the day.  Expresses concern about his blood pressure being elevated despite having taken his medications this morning.  ? ? ?Review of Systems  ?Positive: Chest pain ?Negative: Denies fever, abdominal pain, vomiting ? ?Physical Exam  ? ?Vitals:  ? 12/19/21 1626  ?BP: (!) 157/102  ?Pulse: 82  ?Resp: 18  ?Temp: 98.5 ?F (36.9 ?C)  ?SpO2: 96%  ? ?Gen:   Awake, no distress   ?Resp:  Normal effort  ?MSK:   Moves extremities without difficulty  ?Other:  None. ? ?Medical Decision Making  ?Given the patient's initial medical screening exam, the following diagnostic evaluation has been ordered. The patient will be placed in the appropriate treatment space, once one is available, to complete the evaluation and treatment. I have discussed the plan of care with the patient and I have advised the patient that an ED physician or mid-level practitioner will reevaluate their condition after the test results have been received, as the results may give them additional insight into the type of treatment they may need.  ? ? ?Diagnostics: Labs, EKG, CXR. ? ?Treatments: none immediately ?  ?Varney Daily, PA ?12/19/21 1628 ? ?

## 2021-12-19 NOTE — ED Triage Notes (Signed)
Pt has htn and started having cp today. Pt states he took his bp meds this am ?

## 2021-12-29 ENCOUNTER — Other Ambulatory Visit: Payer: Self-pay

## 2021-12-29 ENCOUNTER — Emergency Department: Payer: 59

## 2021-12-29 DIAGNOSIS — I10 Essential (primary) hypertension: Secondary | ICD-10-CM | POA: Diagnosis not present

## 2021-12-29 DIAGNOSIS — J45909 Unspecified asthma, uncomplicated: Secondary | ICD-10-CM | POA: Diagnosis not present

## 2021-12-29 DIAGNOSIS — R002 Palpitations: Secondary | ICD-10-CM | POA: Insufficient documentation

## 2021-12-29 LAB — CBC
HCT: 49.5 % (ref 39.0–52.0)
Hemoglobin: 16.4 g/dL (ref 13.0–17.0)
MCH: 29 pg (ref 26.0–34.0)
MCHC: 33.1 g/dL (ref 30.0–36.0)
MCV: 87.6 fL (ref 80.0–100.0)
Platelets: 318 10*3/uL (ref 150–400)
RBC: 5.65 MIL/uL (ref 4.22–5.81)
RDW: 13.5 % (ref 11.5–15.5)
WBC: 7.4 10*3/uL (ref 4.0–10.5)
nRBC: 0 % (ref 0.0–0.2)

## 2021-12-29 LAB — BASIC METABOLIC PANEL
Anion gap: 8 (ref 5–15)
BUN: 8 mg/dL (ref 6–20)
CO2: 27 mmol/L (ref 22–32)
Calcium: 9.7 mg/dL (ref 8.9–10.3)
Chloride: 106 mmol/L (ref 98–111)
Creatinine, Ser: 1.23 mg/dL (ref 0.61–1.24)
GFR, Estimated: >60 mL/min (ref >60)
Glucose, Bld: 96 mg/dL (ref 70–99)
Potassium: 3.6 mmol/L (ref 3.5–5.1)
Sodium: 141 mmol/L (ref 135–145)

## 2021-12-29 LAB — TROPONIN I (HIGH SENSITIVITY): Troponin I (High Sensitivity): 6 ng/L (ref <18)

## 2021-12-29 LAB — MAGNESIUM: Magnesium: 2.1 mg/dL (ref 1.7–2.4)

## 2021-12-29 NOTE — ED Triage Notes (Signed)
Pt was diagnosed with Afib and feels like his heart is beating fast tonight. Pt states the feeling started less than an hour ago. Pt was throwing large bags of trash and worked extra shift today.  ?

## 2021-12-30 ENCOUNTER — Emergency Department
Admission: EM | Admit: 2021-12-30 | Discharge: 2021-12-30 | Disposition: A | Discharge status: Home / Self Care | Payer: 59 | Attending: Emergency Medicine | Admitting: Emergency Medicine

## 2021-12-30 DIAGNOSIS — J45909 Unspecified asthma, uncomplicated: Secondary | ICD-10-CM | POA: Diagnosis not present

## 2021-12-30 DIAGNOSIS — I10 Essential (primary) hypertension: Secondary | ICD-10-CM | POA: Diagnosis not present

## 2021-12-30 DIAGNOSIS — R002 Palpitations: Secondary | ICD-10-CM

## 2021-12-30 HISTORY — DX: Unspecified atrial fibrillation: I48.91

## 2021-12-30 LAB — PROTIME-INR
INR: 1 (ref 0.8–1.2)
Prothrombin Time: 12.6 seconds (ref 11.4–15.2)

## 2021-12-30 LAB — TROPONIN I (HIGH SENSITIVITY): Troponin I (High Sensitivity): 5 ng/L (ref <18)

## 2021-12-30 NOTE — Discharge Instructions (Addendum)
Your workup in the Emergency Department today was reassuring.  We did not find any specific abnormalities.  We recommend you drink plenty of fluids, take your regular medications and/or any new ones prescribed today, and follow up with the doctor(s) listed in these documents as recommended.  Return to the Emergency Department if you develop new or worsening symptoms that concern you.  

## 2021-12-30 NOTE — ED Provider Notes (Signed)
? ?[facility] ?Provider Note ? ? ? Event Date/Time  ? First MD Initiated Contact with Patient 12/30/21 0224   ?  (approximate) ? ? ?History  ? ?Irregular Heart Beat ? ? ?HPI ? ?Spencer Tanner is a 46 y.o. male whose medical history includes paroxysmal atrial fibrillation and asthma and hypertension.  He presents by private vehicle for evaluation of palpitations.  He said that he has been working hard including an extra shift today and that he was throwing a large bag of trash and felt like the palpitations began all of a sudden.  However the symptoms have completely resolved.  He denies having chest pain at any point.  He was also not short of breath.  He denies fever, nausea, vomiting, and abdominal pain.  He has been compliant with his diltiazem.  He states he does not think that he takes a blood thinner other than aspirin. ?  ? ? ?Physical Exam  ? ?Triage Vital Signs: ?ED Triage Vitals  ?Enc Vitals Group  ?   BP 12/29/21 2202 (!) 154/92  ?   Pulse Rate 12/29/21 2202 75  ?   Resp 12/29/21 2202 19  ?   Temp 12/29/21 2202 98.6 ?F (37 ?C)  ?   Temp Source 12/29/21 2202 Oral  ?   SpO2 12/29/21 2202 99 %  ?   Weight 12/29/21 2205 95.2 kg (209 lb 14.1 oz)  ?   Height 12/29/21 2205 1.829 m (6')  ?   Head Circumference --   ?   Peak Flow --   ?   Pain Score 12/29/21 2204 8  ?   Pain Loc --   ?   Pain Edu? --   ?   Excl. in GC? --   ? ? ?Most recent vital signs: ?Vitals:  ? 12/29/21 2202  ?BP: (!) 154/92  ?Pulse: 75  ?Resp: 19  ?Temp: 98.6 ?F (37 ?C)  ?SpO2: 99%  ? ? ? ?General: Awake, no distress.  ?CV:  Good peripheral perfusion.  Normal rate, regular rhythm.  Normal heart sounds. ?Resp:  Normal effort.  Lungs are clear to auscultation bilaterally. ?Abd:  No distention.  No tenderness to palpation. ?Other:  Calm, cooperative, no focal neurological deficits ? ? ?ED Results / Procedures / Treatments  ? ?Labs ?(all labs ordered are listed, but only abnormal results are displayed) ?Labs  Reviewed  ?BASIC METABOLIC PANEL  ?CBC  ?PROTIME-INR  ?MAGNESIUM  ?TROPONIN I (HIGH SENSITIVITY)  ?TROPONIN I (HIGH SENSITIVITY)  ? ? ? ?EKG ? ?ED ECG REPORT ?ILoleta Rose, the attending physician, personally viewed and interpreted this ECG. ? ?Date: 12/29/2021 ?EKG Time: 22: 11 ?Rate: 69 ?Rhythm: normal sinus rhythm ?QRS Axis: normal ?Intervals: normal ?ST/T Wave abnormalities: normal ?Narrative Interpretation: no evidence of acute ischemia ? ? ? ?RADIOLOGY ?I personally reviewed and interpreted the patient's two-view chest x-ray and I see no evidence of widened mediastinum nor pneumonia.  The radiologist agreed that there is no evidence of acute abnormality. ? ? ? ?PROCEDURES: ? ?Critical Care performed: No ? ?Procedures ? ? ?MEDICATIONS ORDERED IN ED: ?Medications - No data to display ? ? ?IMPRESSION / MDM / ASSESSMENT AND PLAN / ED COURSE  ?I reviewed the triage vital signs and the nursing notes. ?             ?               ? ?Differential diagnosis includes, but is not limited to,  paroxysmal atrial fibrillation, musculoskeletal strain, anxiety, ACS, pneumonia. ? ?Vital signs are stable other than hypertension.  I interpreted his EKG which is normal sinus with no tachycardia, no PVCs, no evidence of ischemia. ? ?As documented above I reviewed the chest x-ray and there is no acute abnormality. ? ?Labs ordered include pro time INR, basic metabolic panel, CBC, magnesium, high-sensitivity troponin x2.  I personally reviewed the results and they are all within normal limits. ? ?Although palpitations could indicate a potentially life-threatening condition, particularly in a patient previously diagnosed with atrial fibrillation, his evaluation has been very reassuring in the emergency department.  He has no evidence of an acute abnormality and is asymptomatic.  If the patient was in A-fib, it has resolved and he is stable for discharge and outpatient follow-up.  I provided the reassuring results and gave my usual  return precautions, and he understands and agrees with the plan.   ? ? ? ? ?  ? ? ?FINAL CLINICAL IMPRESSION(S) / ED DIAGNOSES  ? ?Final diagnoses:  ?Palpitations  ? ? ? ?Rx / DC Orders  ? ?ED Discharge Orders   ? ? None  ? ?  ? ? ? ?Note:  This document was prepared using Dragon voice recognition software and may include unintentional dictation errors. ?  ?Loleta Rose, MD ?12/30/21 0304 ? ?

## 2022-01-05 NOTE — Progress Notes (Deleted)
Cardiology Office Note    Date:  01/05/2022   ID:  Spencer Tanner, DOB April 28, 1976, MRN [id number]  PCP:  Barbette ReichmannHande, Vishwanath, MD  Cardiologist:  Debbe OdeaBrian Agbor-Etang, MD  Electrophysiologist:  None   Chief Complaint: Follow up  History of Present Illness:   Spencer SensorChristopher Frasco is a 46 y.o. male with history of normal coronary arteries by coronary CTA in 05/2021, PAF, remote cortical infarct noted on MRI of the brain on 11/17/2021, HTN, and headaches who presents for follow up.    He was seen in the ED on 05/29/2021 with chest pain.  He was noted to be an newly documented A-fib with RVR.  High-sensitivity troponin and D-dimer negative.  He was started on Cardizem.  He established care with our office on 05/31/2021 and was noted to be in sinus rhythm.  Subsequent Zio patch in 05/2021 showed no evidence of Afib/flutter with a predominant rhythm of sinus with an average rate of 75 bpm (range 37-139 bpm), 2nd degree AV block type I was noted, junctional rhythm was present, rare PACs, atrial couplet, triplets, PVCs, ventricular couplets and triplets.  Coronary CTA on 06/13/2021 showed a calcium score of 0 and was without evidence of CAD.  He was seen in the ED in 06/2021 three times, initially with elevated BP, 2nd time for atypical chest pain, and a third time for headache. High sensitivity troponin was negative x 2.  Echo in 06/2021 showed an EF of 60-65%, no RWMA, normal LV diastolic function parameters, normal RV systolic function and ventricular cavity size, and no significant valvular abnormalities.  He was seen in the ED in 08/2021 with left sided chest and right upper quadrant pain. High sensitivity troponin was negative x 2.  He was treated with ketorolac.   He was seen in the ED on 10/27/2021 with right upper chest discomfort.  He reported having done a lot of lifting, though was unsure if this was contributing.  Work up was reassuring including negative high sensitivity troponin x 2, normal  d-dimer, and unremarkable CBC.  Outpatient follow-up was recommended.   During each of the above ED visits, he has been documented to be in sinus rhythm.  He was last seen in the office on 11/06/2021 for ED follow up and was without symptoms of angina or decompensation.  He did continue to note intermittent episodes of brief several second lasting chest discomfort and palpitations.  He was maintaining sinus rhythm.  He was trying to minimize caffeine intake and stress.  He did note some fatigue and lightheadedness, and with this we decreased his Cardizem to 120 mg.  He was encouraged to pick up a Kardia mobile device.  He underwent MRI of the brain, for headaches, on 11/17/2021, which showed no acute intracranial process along with changes concerning for a remote cortical infarct.  Since he was last seen on 11/06/2021, he was seen in the ED on 12/19/2021 with chest pain, though appears he left without being seen, and again on 12/30/2021 with palpitations. High sensitivity troponin was normal x 2 and EKG showed sinus rhythm at each visit to the ED.   ***   Labs independently reviewed: 12/2021 - magnesium 2.1, HGB 16.4, PLT 318, potassium 3.6, BUN 8, SCr 1.23 08/2021 - albumin 3.9, AST normal, ALT 45 05/2021 - TSH normal, magnesium 2.0 01/2019 - TC 192, TG 75, HDL 61, LDL 116  Past Medical History:  Diagnosis Date   A-fib (HCC)    Asthma    Hypertension  No past surgical history on file.  Current Medications: No outpatient medications have been marked as taking for the 01/06/22 encounter (Appointment) with Sondra Barges, PA-C.    Allergies:   Amoxicillin and Penicillins   Social History   Socioeconomic History   Marital status: Single    Spouse name: Not on file   Number of children: Not on file   Years of education: Not on file   Highest education level: Not on file  Occupational History   Not on file  Tobacco Use   Smoking status: Former   Smokeless tobacco: Never  Vaping Use    Vaping Use: Never used  Substance and Sexual Activity   Alcohol use: No    Alcohol/week: 0.0 standard drinks   Drug use: No   Sexual activity: Not on file  Other Topics Concern   Not on file  Social History Narrative   Not on file   Social Determinants of Health   Financial Resource Strain: Not on file  Food Insecurity: Not on file  Transportation Needs: Not on file  Physical Activity: Not on file  Stress: Not on file  Social Connections: Not on file     Family History:  The patient's family history is not on file.  ROS:   ROS   EKGs/Labs/Other Studies Reviewed:    Studies reviewed were summarized above. The additional studies were reviewed today:  2D echo 07/08/2021: 1. Left ventricular ejection fraction, by estimation, is 60 to 65%. The  left ventricle has normal function. The left ventricle has no regional  wall motion abnormalities. Left ventricular diastolic parameters were  normal. The average left ventricular  global longitudinal strain is -17.6 %. The global longitudinal strain is  normal.   2. Right ventricular systolic function is normal. The right ventricular  size is normal.   3. The mitral valve is normal in structure. No evidence of mitral valve  regurgitation.   4. The aortic valve is tricuspid. Aortic valve regurgitation is not  visualized.   5. The inferior vena cava is normal in size with greater than 50%  respiratory variability, suggesting right atrial pressure of 3 mmHg. __________   Luci Bank patch 05/2021: Patient had a min HR of 37 bpm, max HR of 139 bpm, and avg HR of 75 bpm. Predominant underlying rhythm was Sinus Rhythm. Second Degree AV Block-Mobitz I (Wenckebach) was present. Junctional Rhythm was present. Isolated SVEs were rare (<1.0%), SVE  Couplets were rare (<1.0%), and SVE Triplets were rare (<1.0%). Isolated VEs were rare (<1.0%, 58), VE Couplets were rare (<1.0%, 2), and VE Triplets were rare (<1.0%, 1).  Benign cardiac monitor, no  significant arrhythmias, no evidence of atrial fibrillation or atrial flutter. __________   Coronary CTA 06/13/2021: FINDINGS: Aorta:  Normal size.  No calcifications.  No dissection.   Aortic Valve:  Trileaflet.  No calcifications.   Coronary Arteries:  Normal coronary origin.  Right dominance.   RCA is a dominant artery that gives rise to PDA and PLA. There is no plaque.   Left main is a large artery that gives rise to LAD and LCX arteries. LM has no disease.   LAD has no plaque.   LCX is a non-dominant artery that gives rise to two obtuse marginal branches. There is no plaque.   Other findings:   Normal pulmonary vein drainage into the left atrium.   Normal left atrial appendage without a thrombus.   Normal size of the pulmonary artery.   IMPRESSION:  1. Coronary calcium score of 0. Patient is low risk for coronary events.   2. Normal coronary origin with right dominance.   3. No evidence of CAD.   4. CAD-RADS 0. No evidence of CAD (0%). Consider non-atherosclerotic causes of chest pain.    EKG:  EKG is ordered today.  The EKG ordered today demonstrates ***  Recent Labs: 05/29/2021: TSH 2.111 09/08/2021: ALT 45 12/29/2021: BUN 8; Creatinine, Ser 1.23; Hemoglobin 16.4; Magnesium 2.1; Platelets 318; Potassium 3.6; Sodium 141  Recent Lipid Panel No results found for: CHOL, TRIG, HDL, CHOLHDL, VLDL, LDLCALC, LDLDIRECT  PHYSICAL EXAM:    VS:  There were no vitals taken for this visit.  BMI: There is no height or weight on file to calculate BMI.  Physical Exam  Wt Readings from Last 3 Encounters:  12/29/21 209 lb 14.1 oz (95.2 kg)  12/19/21 210 lb (95.3 kg)  11/06/21 207 lb 8 oz (94.1 kg)     ASSESSMENT & PLAN:   Lone A-fib:  Chest pain: ***.  Multiple high-sensitivity troponins have been negative in the ED.  Echo showed no significant structural abnormalities.  Coronary CTA showed no evidence of CAD.  HTN: Blood pressure ***  History of remote  cortical infarct:   {Are you ordering a CV Procedure (e.g. stress test, cath, DCCV, TEE, etc)?   Press F2        :[id number]}     Disposition: F/u with Dr. Azucena Cecil or an APP in ***.   Medication Adjustments/Labs and Tests Ordered: Current medicines are reviewed at length with the patient today.  Concerns regarding medicines are outlined above. Medication changes, Labs and Tests ordered today are summarized above and listed in the Patient Instructions accessible in Encounters.   Signed, Eula Listen, PA-C 01/05/2022 9:02 AM     [facility] [place] [address] [place], [place] [address] [phone]

## 2022-01-06 ENCOUNTER — Encounter: Payer: Self-pay | Admitting: Physician Assistant

## 2022-01-06 ENCOUNTER — Other Ambulatory Visit: Payer: Self-pay

## 2022-01-06 ENCOUNTER — Ambulatory Visit (INDEPENDENT_AMBULATORY_CARE_PROVIDER_SITE_OTHER): Payer: 59 | Admitting: Cardiology

## 2022-01-06 VITALS — BP 140/90 | HR 63 | Ht 72.0 in | Wt 212.5 lb

## 2022-01-06 DIAGNOSIS — I480 Paroxysmal atrial fibrillation: Secondary | ICD-10-CM

## 2022-01-06 DIAGNOSIS — R072 Precordial pain: Secondary | ICD-10-CM

## 2022-01-06 DIAGNOSIS — Z8673 Personal history of transient ischemic attack (TIA), and cerebral infarction without residual deficits: Secondary | ICD-10-CM

## 2022-01-06 DIAGNOSIS — I10 Essential (primary) hypertension: Secondary | ICD-10-CM

## 2022-01-06 DIAGNOSIS — I4891 Unspecified atrial fibrillation: Secondary | ICD-10-CM

## 2022-01-06 MED ORDER — LOSARTAN POTASSIUM 25 MG PO TABS
25.0000 mg | ORAL_TABLET | Freq: Every day | ORAL | 3 refills | Status: DC
  Filled 2022-01-06: qty 30, 30d supply, fill #0
  Filled 2022-01-30: qty 30, 30d supply, fill #1
  Filled 2022-06-30: qty 30, 30d supply, fill #2
  Filled 2022-08-20: qty 30, 30d supply, fill #3

## 2022-01-06 MED ORDER — OMEPRAZOLE MAGNESIUM 20 MG PO TBEC: 20.0000 mg | DELAYED_RELEASE_TABLET | Freq: Every day | ORAL | Status: DC

## 2022-01-06 MED ORDER — OMEPRAZOLE 20 MG PO CPDR
20.0000 mg | DELAYED_RELEASE_CAPSULE | Freq: Every day | ORAL | 5 refills | Status: DC
  Filled 2022-01-06: qty 30, 30d supply, fill #0
  Filled 2022-01-30: qty 30, 30d supply, fill #1

## 2022-01-06 NOTE — Patient Instructions (Signed)
Medication Instructions:   Your physician has recommended you make the following change in your medication:     START taking Prilosec 20 MG once a day (you can buy over the counter)/  2.     START taking Losartan 25 MG once a day.  *If you need a refill on your cardiac medications before your next appointment, please call your pharmacy*   Lab Work:  None ordered  Testing/Procedures:  None ordered   Follow-Up: At [facility], you and your health needs are our priority.  As part of our continuing mission to provide you with exceptional heart care, we have created designated Provider Care Teams.  These Care Teams include your primary Cardiologist (physician) and Advanced Practice Providers (APPs -  Physician Assistants and Nurse Practitioners) who all work together to provide you with the care you need, when you need it.  We recommend signing up for the patient portal called "MyChart".  Sign up information is provided on this After Visit Summary.  MyChart is used to connect with patients for Virtual Visits (Telemedicine).  Patients are able to view lab/test results, encounter notes, upcoming appointments, etc.  Non-urgent messages can be sent to your provider as well.   To learn more about what you can do with MyChart, go to [email or web address].    Your next appointment:   6 week(s), or soonest available  The format for your next appointment:   In Person  Provider:   Debbe Odea, MD     Important Information About Sugar

## 2022-01-06 NOTE — Progress Notes (Signed)
Cardiology Office Note:    Date:  01/06/2022   ID:  Spencer Tanner, DOB 1975/11/15, MRN [id number]  PCP:  Barbette Reichmann, MD   [facility] Providers Cardiologist:  Debbe Odea, MD     Referring MD: Barbette Reichmann, MD   Chief Complaint  Patient presents with   Other    2 Month f/u c/o chest pain, pressure legs/head and BP issues.  Meds reviewed verbally with pt.    History of Present Illness:    Spencer Tanner is a 46 y.o. male with a hx of asthma, hypertension, paroxysmal atrial fibrillation, hypertension who presents for follow-up.    Being seen for atrial fibrillation, states having occasional left-sided chest burning, not related with foods.  Also complains of fullness in his head.  Has skipped heartbeats, occurring about 2 times weekly.  Does not check his blood pressure frequently at home.   Prior notes Echo 06/2021 EF 60% Due to A. fib.  Patient was seen in the ED 05/29/2021 due to chest pain.  EKG showed A. fib with RVR.  Cardiac monitor 06/2021, no A. fib or atrial flutter noted.   Past Medical History:  Diagnosis Date   A-fib (HCC)    Asthma    Hypertension     History reviewed. No pertinent surgical history.  Current Medications: Current Meds  Medication Sig   albuterol (VENTOLIN HFA) 108 (90 Base) MCG/ACT inhaler INHALE 2 PUFFS BY MOUTH EVERY 6 HOURS AS NEEDED FOR WHEEZING   aspirin EC 325 MG tablet Take 1 tablet (325 mg total) by mouth daily.   budesonide-formoterol (SYMBICORT) 160-4.5 MCG/ACT inhaler INHALE 2 PUFFS BY MOUTH 2 TIMES DAILY   diltiazem (CARDIZEM CD) 180 MG 24 hr capsule Take 1 capsule (180 mg total) by mouth once daily for 90 days   losartan (COZAAR) 25 MG tablet Take 1 tablet (25 mg total) by mouth daily.   omeprazole (PRILOSEC) 20 MG capsule Take 1 capsule (20 mg total) by mouth daily.   [DISCONTINUED] diltiazem (CARDIZEM CD) 120 MG 24 hr capsule Take 1 capsule (120 mg total) by mouth daily.   [DISCONTINUED]  omeprazole (PRILOSEC OTC) 20 MG tablet Take 1 tablet (20 mg total) by mouth daily.     Allergies:   Amoxicillin and Penicillins   Social History   Socioeconomic History   Marital status: Single    Spouse name: Not on file   Number of children: Not on file   Years of education: Not on file   Highest education level: Not on file  Occupational History   Not on file  Tobacco Use   Smoking status: Former   Smokeless tobacco: Never  Vaping Use   Vaping Use: Never used  Substance and Sexual Activity   Alcohol use: No    Alcohol/week: 0.0 standard drinks   Drug use: No   Sexual activity: Not on file  Other Topics Concern   Not on file  Social History Narrative   Not on file   Social Determinants of Health   Financial Resource Strain: Not on file  Food Insecurity: Not on file  Transportation Needs: Not on file  Physical Activity: Not on file  Stress: Not on file  Social Connections: Not on file     Family History: The patient's family history is not on file.  ROS:   Please see the history of present illness.     All other systems reviewed and are negative.  EKGs/Labs/Other Studies Reviewed:    The following studies were  reviewed today:   EKG:  EKG is ordered today.  EKG shows normal sinus rhythm, normal ECG  Recent Labs: 05/29/2021: TSH 2.111 09/08/2021: ALT 45 12/29/2021: BUN 8; Creatinine, Ser 1.23; Hemoglobin 16.4; Magnesium 2.1; Platelets 318; Potassium 3.6; Sodium 141  Recent Lipid Panel No results found for: CHOL, TRIG, HDL, CHOLHDL, VLDL, LDLCALC, LDLDIRECT   Risk Assessment/Calculations:          Physical Exam:    VS:  BP 140/90 (BP Location: Left Arm, Patient Position: Sitting, Cuff Size: Normal)   Pulse 63   Ht 6' (1.829 m)   Wt 212 lb 8 oz (96.4 kg)   SpO2 98%   BMI 28.82 kg/m     Wt Readings from Last 3 Encounters:  01/06/22 212 lb 8 oz (96.4 kg)  12/29/21 209 lb 14.1 oz (95.2 kg)  12/19/21 210 lb (95.3 kg)     GEN:  Well nourished,  well developed in no acute distress HEENT: Normal NECK: No JVD; No carotid bruits LYMPHATICS: No lymphadenopathy CARDIAC: RRR, no murmurs, rubs, gallops RESPIRATORY:  Clear to auscultation without rales, wheezing or rhonchi  ABDOMEN: Soft, non-tender, non-distended MUSCULOSKELETAL:  No edema; No deformity  SKIN: Warm and dry NEUROLOGIC:  Alert and oriented x 3 PSYCHIATRIC:  Normal affect   ASSESSMENT:    1. Paroxysmal atrial fibrillation (HCC)   2. Primary hypertension   3. Precordial pain     PLAN:    In order of problems listed above:  Paroxysmal atrial fibrillation, lone A-fib.  CHA2DS2-VASc score of 1.  Continue diltiazem 180, aspirin okay.  No skipped heartbeats, cardiac monitor recommended, patient declined for now.  Previous echo and coronary CTA were normal. Hypertension, BP elevated, head fullness.  Start losartan 25 mg daily, continue Cardizem 180 mg daily. Chest pain, chest burning.  Former smoker.  Normal coronary CTA, no evidence of CAD.  Start OTC Prilosec 20 mg daily  Follow-up 6 weeks     Medication Adjustments/Labs and Tests Ordered: Current medicines are reviewed at length with the patient today.  Concerns regarding medicines are outlined above.  Orders Placed This Encounter  Procedures   EKG 12-Lead     Meds ordered this encounter  Medications   DISCONTD: omeprazole (PRILOSEC OTC) 20 MG tablet    Sig: Take 1 tablet (20 mg total) by mouth daily.   losartan (COZAAR) 25 MG tablet    Sig: Take 1 tablet (25 mg total) by mouth daily.    Dispense:  30 tablet    Refill:  3   omeprazole (PRILOSEC) 20 MG capsule    Sig: Take 1 capsule (20 mg total) by mouth daily.    Dispense:  30 capsule    Refill:  5    stock capsules, not tabs      Patient Instructions  Medication Instructions:   Your physician has recommended you make the following change in your medication:     START taking Prilosec 20 MG once a day (you can buy over the counter)/  2.      START taking Losartan 25 MG once a day.  *If you need a refill on your cardiac medications before your next appointment, please call your pharmacy*   Lab Work:  None ordered  Testing/Procedures:  None ordered   Follow-Up: At [facility], you and your health needs are our priority.  As part of our continuing mission to provide you with exceptional heart care, we have created designated Provider Care Teams.  These Care Teams  include your primary Cardiologist (physician) and Advanced Practice Providers (APPs -  Physician Assistants and Nurse Practitioners) who all work together to provide you with the care you need, when you need it.  We recommend signing up for the patient portal called "MyChart".  Sign up information is provided on this After Visit Summary.  MyChart is used to connect with patients for Virtual Visits (Telemedicine).  Patients are able to view lab/test results, encounter notes, upcoming appointments, etc.  Non-urgent messages can be sent to your provider as well.   To learn more about what you can do with MyChart, go to [email or web address].    Your next appointment:   6 week(s), or soonest available  The format for your next appointment:   In Person  Provider:   Debbe OdeaBrian Agbor-Etang, MD     Important Information About Sugar         Signed, Debbe OdeaBrian Agbor-Etang, MD  01/06/2022 9:35 AM    [facility]

## 2022-01-08 ENCOUNTER — Other Ambulatory Visit: Payer: Self-pay

## 2022-01-08 DIAGNOSIS — J4520 Mild intermittent asthma, uncomplicated: Secondary | ICD-10-CM | POA: Diagnosis not present

## 2022-01-08 DIAGNOSIS — I480 Paroxysmal atrial fibrillation: Secondary | ICD-10-CM | POA: Diagnosis not present

## 2022-01-08 DIAGNOSIS — R519 Headache, unspecified: Secondary | ICD-10-CM | POA: Diagnosis not present

## 2022-01-08 DIAGNOSIS — K219 Gastro-esophageal reflux disease without esophagitis: Secondary | ICD-10-CM | POA: Diagnosis not present

## 2022-01-08 DIAGNOSIS — K029 Dental caries, unspecified: Secondary | ICD-10-CM | POA: Diagnosis not present

## 2022-01-08 MED ORDER — GABAPENTIN 100 MG PO CAPS
ORAL_CAPSULE | ORAL | 3 refills | Status: DC
  Filled 2022-01-08: qty 30, 30d supply, fill #0
  Filled 2022-01-30: qty 30, 30d supply, fill #1

## 2022-01-08 MED ORDER — CLINDAMYCIN HCL 300 MG PO CAPS
ORAL_CAPSULE | ORAL | 0 refills | Status: DC
  Filled 2022-01-08: qty 30, 10d supply, fill #0

## 2022-01-15 ENCOUNTER — Other Ambulatory Visit: Payer: Self-pay

## 2022-01-15 MED ORDER — BUDESONIDE-FORMOTEROL FUMARATE 160-4.5 MCG/ACT IN AERO
INHALATION_SPRAY | RESPIRATORY_TRACT | 5 refills | Status: DC
  Filled 2022-01-15: qty 10.2, 30d supply, fill #0
  Filled 2022-02-11: qty 10.2, 30d supply, fill #1
  Filled 2022-03-10: qty 10.2, 30d supply, fill #2
  Filled 2022-04-04: qty 10.2, 30d supply, fill #3
  Filled 2022-06-30: qty 10.2, 30d supply, fill #4
  Filled 2022-07-25: qty 10.2, 30d supply, fill #5

## 2022-01-20 DIAGNOSIS — Z0000 Encounter for general adult medical examination without abnormal findings: Secondary | ICD-10-CM | POA: Diagnosis not present

## 2022-01-20 DIAGNOSIS — J45909 Unspecified asthma, uncomplicated: Secondary | ICD-10-CM | POA: Diagnosis not present

## 2022-01-20 DIAGNOSIS — I480 Paroxysmal atrial fibrillation: Secondary | ICD-10-CM | POA: Diagnosis not present

## 2022-01-20 DIAGNOSIS — R519 Headache, unspecified: Secondary | ICD-10-CM | POA: Diagnosis not present

## 2022-01-30 ENCOUNTER — Other Ambulatory Visit: Payer: Self-pay

## 2022-01-30 MED ORDER — ALBUTEROL SULFATE HFA 108 (90 BASE) MCG/ACT IN AERS
2.0000 | INHALATION_SPRAY | Freq: Four times a day (QID) | RESPIRATORY_TRACT | 5 refills | Status: DC
  Filled 2022-01-30: qty 18, 25d supply, fill #0
  Filled 2022-02-28: qty 18, 25d supply, fill #1
  Filled 2022-03-28: qty 18, 25d supply, fill #2
  Filled 2022-07-21: qty 6.7, 10d supply, fill #3
  Filled 2022-08-20: qty 6.7, 25d supply, fill #4
  Filled 2022-10-02: qty 6.7, 25d supply, fill #5
  Filled 2022-10-29: qty 6.7, 25d supply, fill #6
  Filled 2022-11-26: qty 6.7, 25d supply, fill #7
  Filled 2022-12-31: qty 6.7, 25d supply, fill #8
  Filled 2023-01-20 (×2): qty 6.7, 25d supply, fill #9

## 2022-02-11 ENCOUNTER — Other Ambulatory Visit: Payer: Self-pay

## 2022-02-28 ENCOUNTER — Ambulatory Visit (INDEPENDENT_AMBULATORY_CARE_PROVIDER_SITE_OTHER): Payer: 59 | Admitting: Cardiology

## 2022-02-28 ENCOUNTER — Other Ambulatory Visit: Payer: Self-pay

## 2022-02-28 ENCOUNTER — Encounter: Payer: Self-pay | Admitting: Cardiology

## 2022-02-28 VITALS — BP 146/80 | HR 62 | Ht 73.0 in | Wt 218.4 lb

## 2022-02-28 DIAGNOSIS — I10 Essential (primary) hypertension: Secondary | ICD-10-CM | POA: Diagnosis not present

## 2022-02-28 DIAGNOSIS — K2100 Gastro-esophageal reflux disease with esophagitis, without bleeding: Secondary | ICD-10-CM

## 2022-02-28 DIAGNOSIS — I480 Paroxysmal atrial fibrillation: Secondary | ICD-10-CM

## 2022-02-28 MED ORDER — PANTOPRAZOLE SODIUM 40 MG PO TBEC
40.0000 mg | DELAYED_RELEASE_TABLET | Freq: Every day | ORAL | 5 refills | Status: DC
  Filled 2022-02-28: qty 30, 30d supply, fill #0
  Filled 2022-06-30: qty 30, 30d supply, fill #1
  Filled 2022-10-02: qty 30, 30d supply, fill #2
  Filled 2022-10-29: qty 30, 30d supply, fill #3

## 2022-02-28 MED ORDER — DILTIAZEM HCL ER COATED BEADS 180 MG PO CP24
ORAL_CAPSULE | ORAL | 0 refills | Status: DC
  Filled 2022-02-28: qty 90, 90d supply, fill #0

## 2022-02-28 NOTE — Patient Instructions (Signed)
Medication Instructions:   Your physician has recommended you make the following change in your medication:    STOP taking Omeprazole (Prilosec).  2.    START taking Pantoprazole (Protonix).  *If you need a refill on your cardiac medications before your next appointment, please call your pharmacy*    Follow-Up: At [facility], you and your health needs are our priority.  As part of our continuing mission to provide you with exceptional heart care, we have created designated Provider Care Teams.  These Care Teams include your primary Cardiologist (physician) and Advanced Practice Providers (APPs -  Physician Assistants and Nurse Practitioners) who all work together to provide you with the care you need, when you need it.  We recommend signing up for the patient portal called "MyChart".  Sign up information is provided on this After Visit Summary.  MyChart is used to connect with patients for Virtual Visits (Telemedicine).  Patients are able to view lab/test results, encounter notes, upcoming appointments, etc.  Non-urgent messages can be sent to your provider as well.   To learn more about what you can do with MyChart, go to [email or web address].    Your next appointment:   6 month(s)  The format for your next appointment:   In Person  Provider:   Debbe Odea, MD    Other Instructions   Important Information About Sugar

## 2022-02-28 NOTE — Progress Notes (Signed)
Cardiology Office Note:    Date:  02/28/2022   ID:  Spencer Tanner, DOB 1976-08-13, MRN [id number]  PCP:  Barbette Reichmann, MD   [facility] Providers Cardiologist:  Debbe Odea, MD     Referring MD: Barbette Reichmann, MD   Chief Complaint  Patient presents with   Follow-up    6-8 week follow up. On occasion, patient states that he has burning sensations in chest area, but takes omeprazole Meds reviewed with patient.     History of Present Illness:    Spencer Tanner is a 46 y.o. male with a hx of asthma, hypertension, paroxysmal atrial fibrillation, who presents for follow-up.    Previously seen for chest pain consistent with GERD.  Symptoms typically occur after he eats.  Started on omeprazole 20 mg daily, with improvement in symptoms.  Denies palpitations, dizziness.  Losartan started after last visit due to elevated BP.  Blood pressures at home better controlled, systolics in the 120s.  Takes medications as prescribed.   Prior notes Echo 06/2021 EF 60% Coronary CTA 10/22 calcium score 0, no evidence of CAD Due to A. fib.  Patient was seen in the ED 05/29/2021 due to chest pain.  EKG showed A. fib with RVR.  Cardiac monitor 06/2021, no A. fib or atrial flutter noted.   Past Medical History:  Diagnosis Date   A-fib (HCC)    Asthma    Hypertension     History reviewed. No pertinent surgical history.  Current Medications: Current Meds  Medication Sig   albuterol (VENTOLIN HFA) 108 (90 Base) MCG/ACT inhaler INHALE 2 PUFFS BY MOUTH EVERY 6 HOURS AS NEEDED FOR WHEEZING   aspirin EC 325 MG tablet Take 1 tablet (325 mg total) by mouth daily.   budesonide-formoterol (SYMBICORT) 160-4.5 MCG/ACT inhaler INHALE 2 PUFFS BY MOUTH 2 TIMES DAILY   diltiazem (CARDIZEM CD) 180 MG 24 hr capsule Take 1 capsule (180 mg total) by mouth once daily for 90 days   gabapentin (NEURONTIN) 100 MG capsule Take by mouth.   pantoprazole (PROTONIX) 40 MG tablet Take 1  tablet (40 mg total) by mouth daily.   [DISCONTINUED] omeprazole (PRILOSEC) 20 MG capsule Take 1 capsule (20 mg total) by mouth daily.     Allergies:   Amoxicillin and Penicillins   Social History   Socioeconomic History   Marital status: Single    Spouse name: Not on file   Number of children: Not on file   Years of education: Not on file   Highest education level: Not on file  Occupational History   Not on file  Tobacco Use   Smoking status: Former   Smokeless tobacco: Never  Vaping Use   Vaping Use: Never used  Substance and Sexual Activity   Alcohol use: No    Alcohol/week: 0.0 standard drinks of alcohol   Drug use: No   Sexual activity: Not on file  Other Topics Concern   Not on file  Social History Narrative   Not on file   Social Determinants of Health   Financial Resource Strain: Not on file  Food Insecurity: Not on file  Transportation Needs: Not on file  Physical Activity: Not on file  Stress: Not on file  Social Connections: Not on file     Family History: The patient's family history is not on file.  ROS:   Please see the history of present illness.     All other systems reviewed and are negative.  EKGs/Labs/Other Studies Reviewed:  The following studies were reviewed today:   EKG:  EKG is ordered today.  EKG shows normal sinus rhythm, normal ECG  Recent Labs: 05/29/2021: TSH 2.111 09/08/2021: ALT 45 12/29/2021: BUN 8; Creatinine, Ser 1.23; Hemoglobin 16.4; Magnesium 2.1; Platelets 318; Potassium 3.6; Sodium 141  Recent Lipid Panel No results found for: "CHOL", "TRIG", "HDL", "CHOLHDL", "VLDL", "LDLCALC", "LDLDIRECT"   Risk Assessment/Calculations:          Physical Exam:    VS:  BP (!) 146/80 (BP Location: Left Arm, Patient Position: Sitting, Cuff Size: Normal)   Pulse 62   Ht 6\' 1"  (1.854 m)   Wt 218 lb 6.4 oz (99.1 kg)   SpO2 98%   BMI 28.81 kg/m     Wt Readings from Last 3 Encounters:  02/28/22 218 lb 6.4 oz (99.1 kg)   01/06/22 212 lb 8 oz (96.4 kg)  12/29/21 209 lb 14.1 oz (95.2 kg)     GEN:  Well nourished, well developed in no acute distress HEENT: Normal NECK: No JVD; No carotid bruits LYMPHATICS: No lymphadenopathy CARDIAC: RRR, no murmurs, rubs, gallops RESPIRATORY:  Clear to auscultation without rales, wheezing or rhonchi  ABDOMEN: Soft, non-tender, non-distended MUSCULOSKELETAL:  No edema; No deformity  SKIN: Warm and dry NEUROLOGIC:  Alert and oriented x 3 PSYCHIATRIC:  Normal affect   ASSESSMENT:    1. Paroxysmal atrial fibrillation (HCC)   2. Primary hypertension   3. Gastroesophageal reflux disease with esophagitis without hemorrhage    PLAN:    In order of problems listed above:  Paroxysmal atrial fibrillation, lone A-fib.  CHA2DS2-VASc score of 1.  Continue diltiazem 180, aspirin okay.   Previous echo and coronary CTA were normal. Hypertension, BP elevated, controlled at home, may have a component of whitecoat syndrome.  Continue losartan 25 mg daily, Cardizem 180 mg daily. Chest pain/burning consistent with GERD, improved with OTC omeprazole, start Protonix 40 mg daily.  Follow-up 6 months     Medication Adjustments/Labs and Tests Ordered: Current medicines are reviewed at length with the patient today.  Concerns regarding medicines are outlined above.  No orders of the defined types were placed in this encounter.    Meds ordered this encounter  Medications   pantoprazole (PROTONIX) 40 MG tablet    Sig: Take 1 tablet (40 mg total) by mouth daily.    Dispense:  30 tablet    Refill:  5      Patient Instructions  Medication Instructions:   Your physician has recommended you make the following change in your medication:    STOP taking Omeprazole (Prilosec).  2.    START taking Pantoprazole (Protonix).  *If you need a refill on your cardiac medications before your next appointment, please call your pharmacy*    Follow-Up: At [facility], you and your  health needs are our priority.  As part of our continuing mission to provide you with exceptional heart care, we have created designated Provider Care Teams.  These Care Teams include your primary Cardiologist (physician) and Advanced Practice Providers (APPs -  Physician Assistants and Nurse Practitioners) who all work together to provide you with the care you need, when you need it.  We recommend signing up for the patient portal called "MyChart".  Sign up information is provided on this After Visit Summary.  MyChart is used to connect with patients for Virtual Visits (Telemedicine).  Patients are able to view lab/test results, encounter notes, upcoming appointments, etc.  Non-urgent messages can be sent to your  provider as well.   To learn more about what you can do with MyChart, go to [email or web address].    Your next appointment:   6 month(s)  The format for your next appointment:   In Person  Provider:   Debbe Odea, MD    Other Instructions   Important Information About Sugar         Signed, Debbe Odea, MD  02/28/2022 9:03 AM    [facility]

## 2022-03-04 NOTE — Addendum Note (Signed)
Addended by: Auburn Bilberry D on: 03/04/2022 01:42 PM   Modules accepted: Orders

## 2022-03-10 ENCOUNTER — Other Ambulatory Visit: Payer: Self-pay

## 2022-03-28 ENCOUNTER — Other Ambulatory Visit: Payer: Self-pay

## 2022-03-29 ENCOUNTER — Emergency Department
Admission: EM | Admit: 2022-03-29 | Discharge: 2022-03-29 | Disposition: A | Discharge status: Home / Self Care | Payer: 59 | Attending: Emergency Medicine | Admitting: Emergency Medicine

## 2022-03-29 ENCOUNTER — Encounter: Payer: Self-pay | Admitting: Emergency Medicine

## 2022-03-29 ENCOUNTER — Other Ambulatory Visit: Payer: Self-pay

## 2022-03-29 DIAGNOSIS — R112 Nausea with vomiting, unspecified: Secondary | ICD-10-CM

## 2022-03-29 DIAGNOSIS — R197 Diarrhea, unspecified: Secondary | ICD-10-CM | POA: Diagnosis not present

## 2022-03-29 DIAGNOSIS — A059 Bacterial foodborne intoxication, unspecified: Secondary | ICD-10-CM | POA: Diagnosis not present

## 2022-03-29 LAB — COMPREHENSIVE METABOLIC PANEL
ALT: 55 U/L — ABNORMAL HIGH (ref 0–44)
AST: 39 U/L (ref 15–41)
Albumin: 4.3 g/dL (ref 3.5–5.0)
Alkaline Phosphatase: 75 U/L (ref 38–126)
Anion gap: 6 (ref 5–15)
BUN: 10 mg/dL (ref 6–20)
CO2: 29 mmol/L (ref 22–32)
Calcium: 9.3 mg/dL (ref 8.9–10.3)
Chloride: 104 mmol/L (ref 98–111)
Creatinine, Ser: 1.15 mg/dL (ref 0.61–1.24)
GFR, Estimated: >60 mL/min (ref >60)
Glucose, Bld: 98 mg/dL (ref 70–99)
Potassium: 4.1 mmol/L (ref 3.5–5.1)
Sodium: 139 mmol/L (ref 135–145)
Total Bilirubin: 0.6 mg/dL (ref 0.3–1.2)
Total Protein: 7.4 g/dL (ref 6.5–8.1)

## 2022-03-29 LAB — CBC
HCT: 47.4 % (ref 39.0–52.0)
Hemoglobin: 16 g/dL (ref 13.0–17.0)
MCH: 29.5 pg (ref 26.0–34.0)
MCHC: 33.8 g/dL (ref 30.0–36.0)
MCV: 87.5 fL (ref 80.0–100.0)
Platelets: 299 10*3/uL (ref 150–400)
RBC: 5.42 MIL/uL (ref 4.22–5.81)
RDW: 13 % (ref 11.5–15.5)
WBC: 9 10*3/uL (ref 4.0–10.5)
nRBC: 0 % (ref 0.0–0.2)

## 2022-03-29 LAB — LIPASE, BLOOD: Lipase: 30 U/L (ref 11–51)

## 2022-03-29 MED ORDER — ONDANSETRON 4 MG PO TBDP
4.0000 mg | ORAL_TABLET | Freq: Three times a day (TID) | ORAL | 0 refills | Status: AC | PRN
  Filled 2022-03-29: qty 9, 3d supply, fill #0

## 2022-03-29 NOTE — ED Provider Notes (Signed)
[facility] Emergency Department Provider Note     Event Date/Time   First MD Initiated Contact with Patient 03/29/22 1811     (approximate)   History   Abdominal Pain   HPI  Spencer Tanner is a 46 y.o. male  complains of bad food exposure.  Presents to the ED after he apparently ate a half a can of some bad canned chicken noodle soup.  He noted what looked like mold in the can halfway through.  He did experience some nausea, vomiting, and diarrhea yesterday.  He denies any symptoms today.       Physical Exam   Triage Vital Signs: ED Triage Vitals  Enc Vitals Group     BP 03/29/22 1527 (!) 159/86     Pulse Rate 03/29/22 1527 83     Resp 03/29/22 1527 18     Temp 03/29/22 1527 98.4 F (36.9 C)     Temp Source 03/29/22 1527 Oral     SpO2 03/29/22 1527 97 %     Weight 03/29/22 1524 212 lb (96.2 kg)     Height 03/29/22 1524 6\' 1"  (1.854 m)     Head Circumference --      Peak Flow --      Pain Score 03/29/22 1524 9     Pain Loc --      Pain Edu? --      Excl. in GC? --     Most recent vital signs: Vitals:   03/29/22 1527  BP: (!) 159/86  Pulse: 83  Resp: 18  Temp: 98.4 F (36.9 C)  SpO2: 97%    General Awake, no distress.  HEENT NCAT. PERRL. EOMI. No rhinorrhea. Mucous membranes are moist.  CV:  Good peripheral perfusion.  RESP:  Normal effort.  ABD:  No distention.,  Nontender.  Normal bowel sounds noted x4.   ED Results / Procedures / Treatments   Labs (all labs ordered are listed, but only abnormal results are displayed) Labs Reviewed  COMPREHENSIVE METABOLIC PANEL - Abnormal; Notable for the following components:      Result Value   ALT 55 (*)    All other components within normal limits  LIPASE, BLOOD  CBC  URINALYSIS, ROUTINE W REFLEX MICROSCOPIC     EKG    RADIOLOGY   No results found.   PROCEDURES:  Critical Care performed: No  Procedures   MEDICATIONS ORDERED IN ED: Medications - No  data to display   IMPRESSION / MDM / ASSESSMENT AND PLAN / ED COURSE  I reviewed the triage vital signs and the nursing notes.                              Differential diagnosis includes, but is not limited to, acute viral gastroenteritis, colitis, gastritis  Patient's presentation is most consistent with acute complicated illness / injury requiring diagnostic workup.  At to the ED for evaluation of symptoms including nausea, vomiting, diarrhea shortly after he ate some bad canned soup.  He presents today in no acute distress, noting no ongoing abdominal cramping, nausea, vomiting, or diarrhea.  Labs are reassuring without signs of acute leukocytosis, critical anemia, or electrolyte abnormality.  Patient's exam is also benign and reassuring at this time.  Patient's diagnosis is consistent with acute viral gastroenteritis secondary to bad food. Patient will be discharged home with prescriptions for Zofran. Patient is to follow up with primary provider  as needed or otherwise directed. Patient is given ED precautions to return to the ED for any worsening or new symptoms.     FINAL CLINICAL IMPRESSION(S) / ED DIAGNOSES   Final diagnoses:  Food poisoning  Nausea vomiting and diarrhea     Rx / DC Orders   ED Discharge Orders     None        Note:  This document was prepared using Dragon voice recognition software and may include unintentional dictation errors.    Lissa Hoard, PA-C 03/29/22 1816    Merwyn Katos, MD 03/29/22 1919

## 2022-03-29 NOTE — Discharge Instructions (Addendum)
Increase fluid intake including Powerade and Gatorade to present dehydration.  Take the prescription nausea medicine as needed.

## 2022-03-29 NOTE — ED Provider Triage Note (Signed)
Emergency Medicine Provider Triage Evaluation Note  Spencer Tanner , a 46 y.o. male  was evaluated in triage.  Pt complains of bad food exposure.  Presents to the ED after he apparently ate a half a can of some bad canned chicken noodle soup.  He noted what looked like mold in the can halfway through.  He did experience some nausea, vomiting, and diarrhea yesterday.  He denies any symptoms today.  Review of Systems  Positive: NVD, gen abd pain Negative: FCS  Physical Exam  BP (!) 159/86 (BP Location: Left Arm)   Pulse 83   Temp 98.4 F (36.9 C) (Oral)   Resp 18   Ht 6\' 1"  (1.854 m)   Wt 96.2 kg   SpO2 97%   BMI 27.97 kg/m  Gen:   Awake, no distress  NAD Resp:  Normal effort CTA MSK:   Moves extremities without difficulty  Other:  Soft, nontender  Medical Decision Making  Medically screening exam initiated at 4:14 PM.  Appropriate orders placed.  Spencer Tanner was informed that the remainder of the evaluation will be completed by another provider, this initial triage assessment does not replace that evaluation, and the importance of remaining in the ED until their evaluation is complete.  Patient to the ED for evaluation of resolved episode of nausea, vomiting, diarrhea after he ate some bad canned chicken noodle soup yesterday.   Spencer Sensor, PA-C 03/29/22 1616

## 2022-03-29 NOTE — ED Triage Notes (Signed)
Pt via POV from home. Pt c/o generalized abd pain, nausea, and diarrhea since yesterday states that it started after he ate a bad can of chicken noodle soup. Denies fevers. Denies urinary symptoms. Pt is A&Ox4 and NAD

## 2022-03-30 ENCOUNTER — Other Ambulatory Visit: Payer: Self-pay

## 2022-03-31 ENCOUNTER — Other Ambulatory Visit: Payer: Self-pay

## 2022-04-04 ENCOUNTER — Other Ambulatory Visit: Payer: Self-pay

## 2022-04-22 ENCOUNTER — Other Ambulatory Visit: Payer: Self-pay

## 2022-04-22 DIAGNOSIS — R0789 Other chest pain: Secondary | ICD-10-CM | POA: Diagnosis not present

## 2022-04-22 DIAGNOSIS — R109 Unspecified abdominal pain: Secondary | ICD-10-CM | POA: Diagnosis not present

## 2022-04-22 DIAGNOSIS — R7309 Other abnormal glucose: Secondary | ICD-10-CM | POA: Diagnosis not present

## 2022-04-22 DIAGNOSIS — J4540 Moderate persistent asthma, uncomplicated: Secondary | ICD-10-CM | POA: Diagnosis not present

## 2022-04-22 DIAGNOSIS — I480 Paroxysmal atrial fibrillation: Secondary | ICD-10-CM | POA: Diagnosis not present

## 2022-04-22 DIAGNOSIS — K582 Mixed irritable bowel syndrome: Secondary | ICD-10-CM | POA: Diagnosis not present

## 2022-04-22 DIAGNOSIS — K219 Gastro-esophageal reflux disease without esophagitis: Secondary | ICD-10-CM | POA: Diagnosis not present

## 2022-04-22 DIAGNOSIS — J4520 Mild intermittent asthma, uncomplicated: Secondary | ICD-10-CM | POA: Diagnosis not present

## 2022-04-22 MED ORDER — ALBUTEROL SULFATE HFA 108 (90 BASE) MCG/ACT IN AERS
INHALATION_SPRAY | RESPIRATORY_TRACT | 5 refills | Status: DC
  Filled 2022-04-22: qty 6.7, 30d supply, fill #0
  Filled 2022-05-23: qty 6.7, 30d supply, fill #1

## 2022-04-22 MED ORDER — DILTIAZEM HCL ER COATED BEADS 180 MG PO CP24
ORAL_CAPSULE | ORAL | 0 refills | Status: DC
  Filled 2022-04-22 – 2022-05-23 (×2): qty 90, 90d supply, fill #0

## 2022-04-22 MED ORDER — GABAPENTIN 100 MG PO CAPS
ORAL_CAPSULE | ORAL | 3 refills | Status: DC
  Filled 2022-04-22: qty 30, 30d supply, fill #0

## 2022-04-22 MED ORDER — BUDESONIDE-FORMOTEROL FUMARATE 160-4.5 MCG/ACT IN AERO
INHALATION_SPRAY | RESPIRATORY_TRACT | 5 refills | Status: DC
  Filled 2022-04-22: qty 10.2, 30d supply, fill #0
  Filled 2022-05-23: qty 10.2, 30d supply, fill #1

## 2022-04-28 ENCOUNTER — Other Ambulatory Visit: Payer: Self-pay

## 2022-05-23 ENCOUNTER — Other Ambulatory Visit: Payer: Self-pay

## 2022-06-16 ENCOUNTER — Telehealth: Payer: Self-pay | Admitting: Cardiology

## 2022-06-16 NOTE — Telephone Encounter (Signed)
Patient walked in this morning to make an appointment. States he has been having a "pinching" feeling.  Pt c/o of Chest Pain: STAT if CP now or developed within 24 hours  1. Are you having CP right now? Yes, "pinching feeling"  2. Are you experiencing any other symptoms (ex. SOB, nausea, vomiting, sweating)? A little SOB, but states he does have asthma  3. How long have you been experiencing CP? 2 days  4. Is your CP continuous or coming and going? Comes and goes  5. Have you taken Nitroglycerin? No  Patient made an appt for this Friday 11/3 with Dr. Garen Lah ?

## 2022-06-16 NOTE — Telephone Encounter (Signed)
Spoke with Dr. Garen Lah. Previous Echo and CCTA normal. Okay to follow up on Fri 11/3/523.

## 2022-06-19 ENCOUNTER — Other Ambulatory Visit: Payer: Self-pay

## 2022-06-19 DIAGNOSIS — R220 Localized swelling, mass and lump, head: Secondary | ICD-10-CM | POA: Diagnosis not present

## 2022-06-19 DIAGNOSIS — I480 Paroxysmal atrial fibrillation: Secondary | ICD-10-CM | POA: Diagnosis not present

## 2022-06-19 DIAGNOSIS — J4520 Mild intermittent asthma, uncomplicated: Secondary | ICD-10-CM | POA: Diagnosis not present

## 2022-06-19 DIAGNOSIS — K130 Diseases of lips: Secondary | ICD-10-CM | POA: Diagnosis not present

## 2022-06-19 DIAGNOSIS — Z23 Encounter for immunization: Secondary | ICD-10-CM | POA: Diagnosis not present

## 2022-06-19 DIAGNOSIS — K219 Gastro-esophageal reflux disease without esophagitis: Secondary | ICD-10-CM | POA: Diagnosis not present

## 2022-06-19 DIAGNOSIS — M79672 Pain in left foot: Secondary | ICD-10-CM | POA: Diagnosis not present

## 2022-06-19 MED ORDER — DICLOFENAC SODIUM 1 % EX GEL
2.0000 g | Freq: Four times a day (QID) | CUTANEOUS | 3 refills | Status: DC
  Filled 2022-06-19: qty 100, 12d supply, fill #0
  Filled 2022-10-02: qty 100, 12d supply, fill #1

## 2022-06-19 MED ORDER — NYSTATIN 100000 UNIT/GM EX CREA
TOPICAL_CREAM | Freq: Two times a day (BID) | CUTANEOUS | 2 refills | Status: DC
  Filled 2022-06-19: qty 30, 10d supply, fill #0

## 2022-06-19 MED ORDER — NYSTATIN 100000 UNIT/ML MT SUSP
OROMUCOSAL | 0 refills | Status: DC
  Filled 2022-06-19: qty 200, 10d supply, fill #0

## 2022-06-19 MED ORDER — CLINDAMYCIN HCL 300 MG PO CAPS
ORAL_CAPSULE | ORAL | 0 refills | Status: DC
  Filled 2022-06-19: qty 30, 10d supply, fill #0

## 2022-06-20 ENCOUNTER — Ambulatory Visit: Payer: 59 | Admitting: Cardiology

## 2022-06-24 ENCOUNTER — Ambulatory Visit: Payer: 59 | Attending: Cardiology | Admitting: Cardiology

## 2022-06-24 ENCOUNTER — Encounter: Payer: Self-pay | Admitting: Cardiology

## 2022-06-24 VITALS — BP 130/100 | HR 63 | Ht 73.0 in | Wt 215.8 lb

## 2022-06-24 DIAGNOSIS — I10 Essential (primary) hypertension: Secondary | ICD-10-CM

## 2022-06-24 DIAGNOSIS — K2100 Gastro-esophageal reflux disease with esophagitis, without bleeding: Secondary | ICD-10-CM

## 2022-06-24 DIAGNOSIS — I480 Paroxysmal atrial fibrillation: Secondary | ICD-10-CM

## 2022-06-24 NOTE — Progress Notes (Signed)
Cardiology Office Note:    Date:  06/24/2022   ID:  Spencer Tanner, DOB 1976/05/09, MRN [id number]  PCP:  Barbette Reichmann, MD   [facility] Providers Cardiologist:  Debbe Odea, MD     Referring MD: Barbette Reichmann, MD   Chief Complaint  Patient presents with   Follow-up    Pinching feeling, Afib, some arm pain, leg pain, SOB    History of Present Illness:    Spencer Tanner is a 46 y.o. male with a hx of asthma, hypertension, whitecoat syndrome, paroxysmal atrial fibrillation, who presents for follow-up.    States having occasional chest fullness, history of heartburn well controlled with Protonix.  He has been going to a lot of financial stress, his car is currently at a shop, he is not able to afford cost to fix it.  Also has to make monthly payments to versus car.  Compliant with medications as prescribed, blood pressures controlled at home.   Prior notes Echo 06/2021 EF 60% Coronary CTA 10/22 calcium score 0, no evidence of CAD Due to A. fib.  Patient was seen in the ED 05/29/2021 due to chest pain.  EKG showed A. fib with RVR.  Cardiac monitor 06/2021, no A. fib or atrial flutter noted.   Past Medical History:  Diagnosis Date   A-fib (HCC)    Asthma    Hypertension     History reviewed. No pertinent surgical history.  Current Medications: No outpatient medications have been marked as taking for the 06/24/22 encounter (Office Visit) with Debbe Odea, MD.     Allergies:   Amoxicillin and Penicillins   Social History   Socioeconomic History   Marital status: Single    Spouse name: Not on file   Number of children: Not on file   Years of education: Not on file   Highest education level: Not on file  Occupational History   Not on file  Tobacco Use   Smoking status: Former   Smokeless tobacco: Never  Vaping Use   Vaping Use: Never used  Substance and Sexual Activity   Alcohol use: No    Alcohol/week: 0.0 standard drinks  of alcohol   Drug use: No   Sexual activity: Not on file  Other Topics Concern   Not on file  Social History Narrative   Not on file   Social Determinants of Health   Financial Resource Strain: Not on file  Food Insecurity: Not on file  Transportation Needs: Not on file  Physical Activity: Not on file  Stress: Not on file  Social Connections: Not on file     Family History: The patient's family history is not on file.  ROS:   Please see the history of present illness.     All other systems reviewed and are negative.  EKGs/Labs/Other Studies Reviewed:    The following studies were reviewed today:   EKG:  EKG is ordered today.  EKG shows normal sinus rhythm, normal ECG  Recent Labs: 12/29/2021: Magnesium 2.1 03/29/2022: ALT 55; BUN 10; Creatinine, Ser 1.15; Hemoglobin 16.0; Platelets 299; Potassium 4.1; Sodium 139  Recent Lipid Panel No results found for: "CHOL", "TRIG", "HDL", "CHOLHDL", "VLDL", "LDLCALC", "LDLDIRECT"   Risk Assessment/Calculations:          Physical Exam:    VS:  BP (!) 130/100 (BP Location: Left Arm, Patient Position: Sitting, Cuff Size: Normal)   Pulse 63   Ht 6\' 1"  (1.854 m)   Wt 215 lb 12.8 oz (97.9 kg)  SpO2 98%   BMI 28.47 kg/m     Wt Readings from Last 3 Encounters:  06/24/22 215 lb 12.8 oz (97.9 kg)  03/29/22 212 lb (96.2 kg)  02/28/22 218 lb 6.4 oz (99.1 kg)     GEN:  Well nourished, well developed in no acute distress HEENT: Normal NECK: No JVD; No carotid bruits LYMPHATICS: No lymphadenopathy CARDIAC: RRR, no murmurs, rubs, gallops RESPIRATORY:  Clear to auscultation without rales, wheezing or rhonchi  ABDOMEN: Soft, non-tender, non-distended MUSCULOSKELETAL:  No edema; No deformity  SKIN: Warm and dry NEUROLOGIC:  Alert and oriented x 3 PSYCHIATRIC:  Normal affect   ASSESSMENT:    1. Paroxysmal atrial fibrillation (HCC)   2. Primary hypertension   3. Gastroesophageal reflux disease with esophagitis without  hemorrhage    PLAN:    In order of problems listed above:  Paroxysmal atrial fibrillation, lone A-fib.  CHA2DS2-VASc score of 1.  Continue diltiazem 180, aspirin okay.   Previous echo and coronary CTA were normal.  Denies palpitations Hypertension, BP elevated, controlled at home. known history of whitecoat syndrome.  Continue losartan 25 mg daily, Cardizem 180 mg daily. Chest pain/burning consistent controlled on Protonix Protonix 40 mg daily.  Nonspecific chest discomfort likely from anxiety/current financial stress.  Follow-up yearly     Medication Adjustments/Labs and Tests Ordered: Current medicines are reviewed at length with the patient today.  Concerns regarding medicines are outlined above.  Orders Placed This Encounter  Procedures   EKG 12-Lead     No orders of the defined types were placed in this encounter.     Patient Instructions  Medication Instructions:   Your physician recommends that you continue on your current medications as directed. Please refer to the Current Medication list given to you today.  *If you need a refill on your cardiac medications before your next appointment, please call your pharmacy*    Follow-Up: At [facility], you and your health needs are our priority.  As part of our continuing mission to provide you with exceptional heart care, we have created designated Provider Care Teams.  These Care Teams include your primary Cardiologist (physician) and Advanced Practice Providers (APPs -  Physician Assistants and Nurse Practitioners) who all work together to provide you with the care you need, when you need it.  We recommend signing up for the patient portal called "MyChart".  Sign up information is provided on this After Visit Summary.  MyChart is used to connect with patients for Virtual Visits (Telemedicine).  Patients are able to view lab/test results, encounter notes, upcoming appointments, etc.  Non-urgent messages can be sent  to your provider as well.   To learn more about what you can do with MyChart, go to [email or web address].    Your next appointment:   1 year(s)  The format for your next appointment:   In Person  Provider:   You may see Kate Sable, MD or one of the following Advanced Practice Providers on your designated Care Team:   Murray Hodgkins, NP Christell Faith, PA-C Cadence Kathlen Mody, PA-C Gerrie Nordmann, NP    Other Instructions   Important Information About Sugar         Signed, Kate Sable, MD  06/24/2022 11:13 AM    [place]

## 2022-06-24 NOTE — Patient Instructions (Signed)
Medication Instructions:   Your physician recommends that you continue on your current medications as directed. Please refer to the Current Medication list given to you today.   *If you need a refill on your cardiac medications before your next appointment, please call your pharmacy*   Follow-Up: At [facility], you and your health needs are our priority.  As part of our continuing mission to provide you with exceptional heart care, we have created designated Provider Care Teams.  These Care Teams include your primary Cardiologist (physician) and Advanced Practice Providers (APPs -  Physician Assistants and Nurse Practitioners) who all work together to provide you with the care you need, when you need it.  We recommend signing up for the patient portal called "MyChart".  Sign up information is provided on this After Visit Summary.  MyChart is used to connect with patients for Virtual Visits (Telemedicine).  Patients are able to view lab/test results, encounter notes, upcoming appointments, etc.  Non-urgent messages can be sent to your provider as well.   To learn more about what you can do with MyChart, go to [email or web address].    Your next appointment:   1 year(s)  The format for your next appointment:   In Person  Provider:   You may see Brian Agbor-Etang, MD or one of the following Advanced Practice Providers on your designated Care Team:   Jowan Berge, NP Ryan Dunn, PA-C Cadence Furth, PA-C Sheri Hammock, NP    Other Instructions   Important Information About Sugar       

## 2022-06-30 ENCOUNTER — Other Ambulatory Visit: Payer: Self-pay

## 2022-07-21 ENCOUNTER — Other Ambulatory Visit: Payer: Self-pay

## 2022-07-25 ENCOUNTER — Other Ambulatory Visit: Payer: Self-pay

## 2022-07-28 ENCOUNTER — Other Ambulatory Visit: Payer: Self-pay

## 2022-08-04 ENCOUNTER — Encounter: Payer: Self-pay | Admitting: Emergency Medicine

## 2022-08-04 ENCOUNTER — Other Ambulatory Visit: Payer: Self-pay

## 2022-08-04 ENCOUNTER — Emergency Department
Admission: EM | Admit: 2022-08-04 | Discharge: 2022-08-05 | Disposition: A | Discharge status: Home / Self Care | Payer: 59 | Attending: Emergency Medicine | Admitting: Emergency Medicine

## 2022-08-04 DIAGNOSIS — B349 Viral infection, unspecified: Secondary | ICD-10-CM | POA: Insufficient documentation

## 2022-08-04 DIAGNOSIS — R059 Cough, unspecified: Secondary | ICD-10-CM | POA: Diagnosis present

## 2022-08-04 DIAGNOSIS — Z1152 Encounter for screening for COVID-19: Secondary | ICD-10-CM | POA: Insufficient documentation

## 2022-08-04 DIAGNOSIS — J111 Influenza due to unidentified influenza virus with other respiratory manifestations: Secondary | ICD-10-CM | POA: Diagnosis not present

## 2022-08-04 LAB — RESP PANEL BY RT-PCR (RSV, FLU A&B, COVID)  RVPGX2
Influenza A by PCR: POSITIVE — AB
Influenza B by PCR: NEGATIVE
Resp Syncytial Virus by PCR: NEGATIVE
SARS Coronavirus 2 by RT PCR: NEGATIVE

## 2022-08-04 NOTE — ED Provider Notes (Addendum)
   [facility] Provider Note    Event Date/Time   First MD Initiated Contact with Patient 08/04/22 2311     (approximate)   History   Cough   HPI  Spencer Tanner is a 46 y.o. male with a history of paroxysmal A-fib who presents with complaints of cough, nasal congestion, fatigue, headache.  He reports the symptoms started today.  No shortness of breath     Physical Exam   Triage Vital Signs: ED Triage Vitals  Enc Vitals Group     BP 08/04/22 2235 115/79     Pulse Rate 08/04/22 2235 (!) 110     Resp 08/04/22 2235 18     Temp 08/04/22 2235 99 F (37.2 C)     Temp Source 08/04/22 2235 Oral     SpO2 08/04/22 2235 100 %     Weight 08/04/22 2232 95.3 kg (210 lb)     Height 08/04/22 2232 1.829 m (6')     Head Circumference --      Peak Flow --      Pain Score 08/04/22 2232 0     Pain Loc --      Pain Edu? --      Excl. in GC? --     Most recent vital signs: Vitals:   08/04/22 2235  BP: 115/79  Pulse: (!) 110  Resp: 18  Temp: 99 F (37.2 C)  SpO2: 100%     General: Awake, no distress.  CV:  Good peripheral perfusion.  Resp:  Normal effort.  Auscultation bilaterally Abd:  No distention.  Other:     ED Results / Procedures / Treatments   Labs (all labs ordered are listed, but only abnormal results are displayed) Labs Reviewed  RESP PANEL BY RT-PCR (RSV, FLU A&B, COVID)  [id number]     EKG     RADIOLOGY     PROCEDURES:  Critical Care performed:   Procedures   MEDICATIONS ORDERED IN ED: Medications - No data to display   IMPRESSION / MDM / ASSESSMENT AND PLAN / ED COURSE  I reviewed the triage vital signs and the nursing notes. Patient's presentation is most consistent with acute illness / injury with system symptoms.  Patient presents with symptoms consistent with viral illness.  Differential includes influenza, COVID, other respiratory virus.  Patient is well-appearing and in no acute distress.  PCR panel  is pending.  Patient is opted to go home and check his results on MyChart, recommend supportive care.  Outpatient follow-up as needed        FINAL CLINICAL IMPRESSION(S) / ED DIAGNOSES   Final diagnoses:  Influenza-like illness     Rx / DC Orders   ED Discharge Orders     None        Note:  This document was prepared using Dragon voice recognition software and may include unintentional dictation errors.   Jene Every, MD 08/04/22 4401    Jene Every, MD 08/04/22 [phone]

## 2022-08-04 NOTE — ED Triage Notes (Signed)
Patient ambulatory to triage with steady gait, without difficulty or distress noted; pt reports prod cough yellow sputum and sinus congestion

## 2022-08-10 ENCOUNTER — Emergency Department
Admission: EM | Admit: 2022-08-10 | Discharge: 2022-08-10 | Disposition: A | Discharge status: Home / Self Care | Payer: 59 | Attending: Emergency Medicine | Admitting: Emergency Medicine

## 2022-08-10 ENCOUNTER — Other Ambulatory Visit: Payer: Self-pay

## 2022-08-10 DIAGNOSIS — R059 Cough, unspecified: Secondary | ICD-10-CM | POA: Diagnosis present

## 2022-08-10 DIAGNOSIS — H1033 Unspecified acute conjunctivitis, bilateral: Secondary | ICD-10-CM | POA: Insufficient documentation

## 2022-08-10 DIAGNOSIS — H1030 Unspecified acute conjunctivitis, unspecified eye: Secondary | ICD-10-CM

## 2022-08-10 DIAGNOSIS — J0100 Acute maxillary sinusitis, unspecified: Secondary | ICD-10-CM

## 2022-08-10 DIAGNOSIS — B9689 Other specified bacterial agents as the cause of diseases classified elsewhere: Secondary | ICD-10-CM | POA: Diagnosis not present

## 2022-08-10 MED ORDER — SULFACETAMIDE SODIUM 10 % OP SOLN: 2.0000 [drp] | Freq: Four times a day (QID) | OPHTHALMIC | 0 refills | Status: AC

## 2022-08-10 MED ORDER — AZITHROMYCIN 250 MG PO TABS: ORAL_TABLET | ORAL | 0 refills | Status: DC

## 2022-08-10 NOTE — ED Triage Notes (Signed)
Pt to ED POV for bilateral redness to eyes (sclera)  Was seen here a few days ago for cold symptoms  Pt has unlabored respirations, states feels slightly congested and intermittent cough  Pt unsure if any drainage. Sclera both red. Denies vision changes, denies itching to eyes. PA at bedside.

## 2022-08-10 NOTE — Discharge Instructions (Signed)
Follow up with your regular doctor if not improving in 3 days

## 2022-08-10 NOTE — ED Provider Notes (Signed)
[facility] Provider Note    Event Date/Time   First MD Initiated Contact with Patient 08/10/22 1524     (approximate)   History   eyes red   HPI  Spencer Tanner is a 46 y.o. male with no significant past medical history presents with continued URI symptoms along with his sclera being injected.  Patient states he has had no matting or drainage.  States cannot go back to work with his eyes being red.  Cough has been productive with yellow mucus.  No fever.  Was tested a few days ago for COVID, influenza and RSV which was all negative.      Physical Exam   Triage Vital Signs: ED Triage Vitals  Enc Vitals Group     BP 08/10/22 1447 (!) 149/89     Pulse Rate 08/10/22 1447 76     Resp 08/10/22 1447 16     Temp 08/10/22 1447 97.6 F (36.4 C)     Temp Source 08/10/22 1447 Oral     SpO2 08/10/22 1447 96 %     Weight 08/10/22 1449 210 lb (95.3 kg)     Height 08/10/22 1449 6' (1.829 m)     Head Circumference --      Peak Flow --      Pain Score 08/10/22 1449 0     Pain Loc --      Pain Edu? --      Excl. in [place]? --     Most recent vital signs: Vitals:   08/10/22 1447  BP: (!) 149/89  Pulse: 76  Resp: 16  Temp: 97.6 F (36.4 C)  SpO2: 96%     General: Awake, no distress.   CV:  Good peripheral perfusion. regular rate and  rhythm Resp:  Normal effort. Lungs cta Abd:  No distention.   Other:  PERRL, EOMI, mild injection noted bilaterally of the conjunctiva   ED Results / Procedures / Treatments   Labs (all labs ordered are listed, but only abnormal results are displayed) Labs Reviewed - No data to display   EKG     RADIOLOGY     PROCEDURES:   Procedures   MEDICATIONS ORDERED IN ED: Medications - No data to display   IMPRESSION / MDM / ASSESSMENT AND PLAN / ED COURSE  I reviewed the triage vital signs and the nursing notes.                              Differential diagnosis includes, but is not limited  to, acute conjunctivitis, URI, COVID, influenza  Patient's presentation is most consistent with Patient's presentation is most consistent with acute, uncomplicated illness.   Will not retest patient for COVID/influenza as he had a negative test 2 days ago and is continued to be afebrile.  Will start him on a Z-Pak for his URI symptoms and give him ophthalmic drops.  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  Telomeres no recent he cannot return to work as he is afebrile has no matting or drainage.  Patient is agreement treatment plan.  Discharged stable condition.      FINAL CLINICAL IMPRESSION(S) / ED DIAGNOSES   Final diagnoses:  Acute bacterial conjunctivitis, unspecified laterality  Acute maxillary sinusitis, recurrence not specified     Rx / DC Orders   ED Discharge Orders          Ordered    azithromycin (ZITHROMAX Z-PAK)  250 MG tablet        08/10/22 1531    sulfacetamide (BLEPH-10) 10 % ophthalmic solution  4 times daily        08/10/22 1531             Note:  This document was prepared using Dragon voice recognition software and may include unintentional dictation errors.    Versie Starks, PA-C 08/10/22 1644    Harvest Dark, MD 08/10/22 1946

## 2022-08-10 NOTE — ED Provider Triage Note (Signed)
Emergency Medicine Provider Triage Evaluation Note  Spencer Tanner , a 46 y.o. male  was evaluated in triage.  Pt complains of red eyes.  Was seen here couple days ago in the emergency department but still continues to have some intermittent cough and congestion.  Review of Systems  Positive: Red eyes, Negative: No vision changes, no drainage, no known fever.  Physical Exam  BP (!) 149/89   Pulse 76   Temp 97.6 F (36.4 C) (Oral)   Resp 16   Ht 6' (1.829 m)   Wt 95.3 kg   SpO2 96%   BMI 28.48 kg/m  Gen:   Awake, no distress very talkative. Resp:  Normal effort  MSK:   Moves extremities without difficulty  Other:  Conjunctive bilaterally mildly erythematous but no exudate is noted.  Medical Decision Making  Medically screening exam initiated at 2:49 PM.  Appropriate orders placed.  Celeste Candelas was informed that the remainder of the evaluation will be completed by another provider, this initial triage assessment does not replace that evaluation, and the importance of remaining in the ED until their evaluation is complete.     Tommi Rumps, PA-C 08/10/22 1452

## 2022-08-20 ENCOUNTER — Other Ambulatory Visit: Payer: Self-pay

## 2022-08-26 ENCOUNTER — Other Ambulatory Visit: Payer: Self-pay

## 2022-08-26 MED ORDER — BUDESONIDE-FORMOTEROL FUMARATE 160-4.5 MCG/ACT IN AERO
INHALATION_SPRAY | RESPIRATORY_TRACT | 5 refills | Status: DC
  Filled 2022-08-26: qty 10.2, 30d supply, fill #0
  Filled 2022-10-02: qty 10.2, 30d supply, fill #1
  Filled 2022-10-29: qty 10.2, 30d supply, fill #2
  Filled 2022-11-26: qty 10.2, 30d supply, fill #3
  Filled 2022-12-31: qty 10.2, 30d supply, fill #4
  Filled 2023-01-20: qty 10.2, 30d supply, fill #5

## 2022-08-26 MED ORDER — DILTIAZEM HCL ER COATED BEADS 180 MG PO CP24
ORAL_CAPSULE | ORAL | 0 refills | Status: DC
  Filled 2022-08-26: qty 90, 90d supply, fill #0

## 2022-08-27 ENCOUNTER — Other Ambulatory Visit: Payer: Self-pay

## 2022-10-02 ENCOUNTER — Other Ambulatory Visit: Payer: Self-pay

## 2022-10-29 ENCOUNTER — Other Ambulatory Visit: Payer: Self-pay

## 2022-11-14 IMAGING — CR DG ABDOMEN ACUTE W/ 1V CHEST
4 series · 4 of 4 positions shown · non-contrast
Comparison: None.

CLINICAL DATA: Abdominal distension, initial encounter

EXAM:
DG ABDOMEN ACUTE WITH 1 VIEW CHEST

[chest pa]
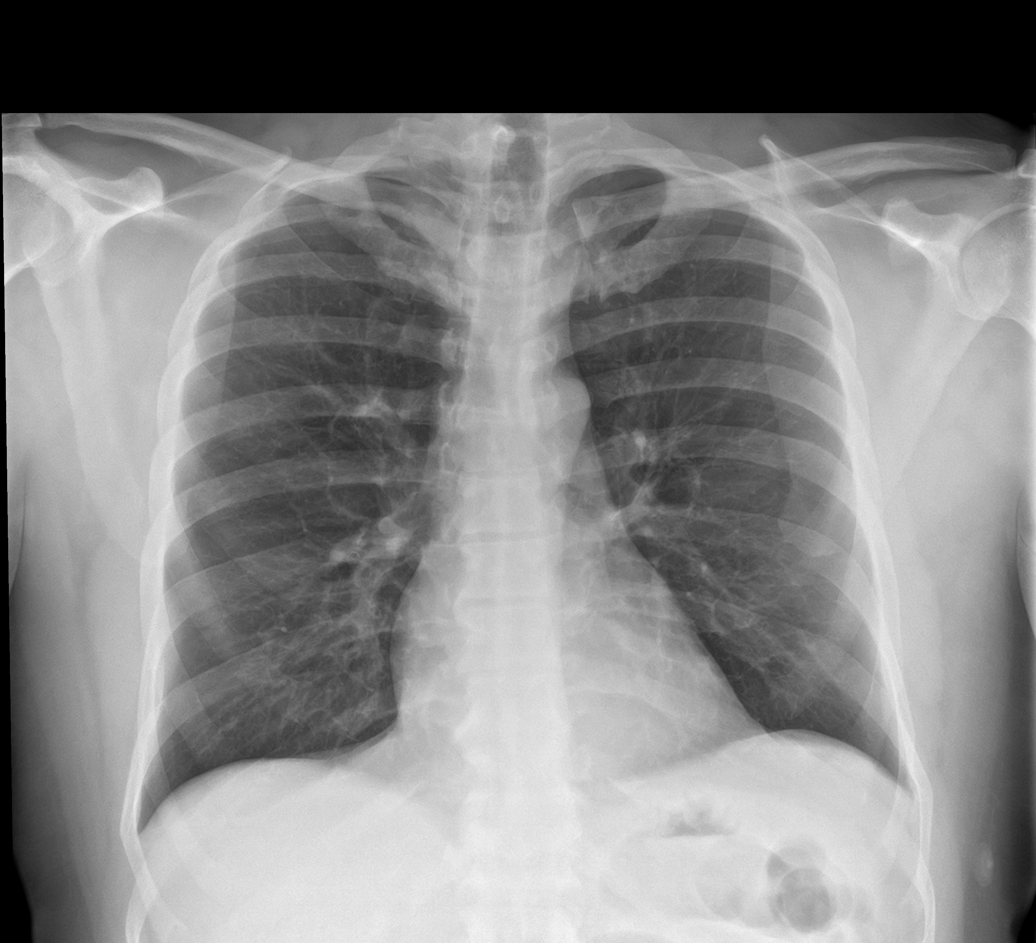

[abdomen erect]
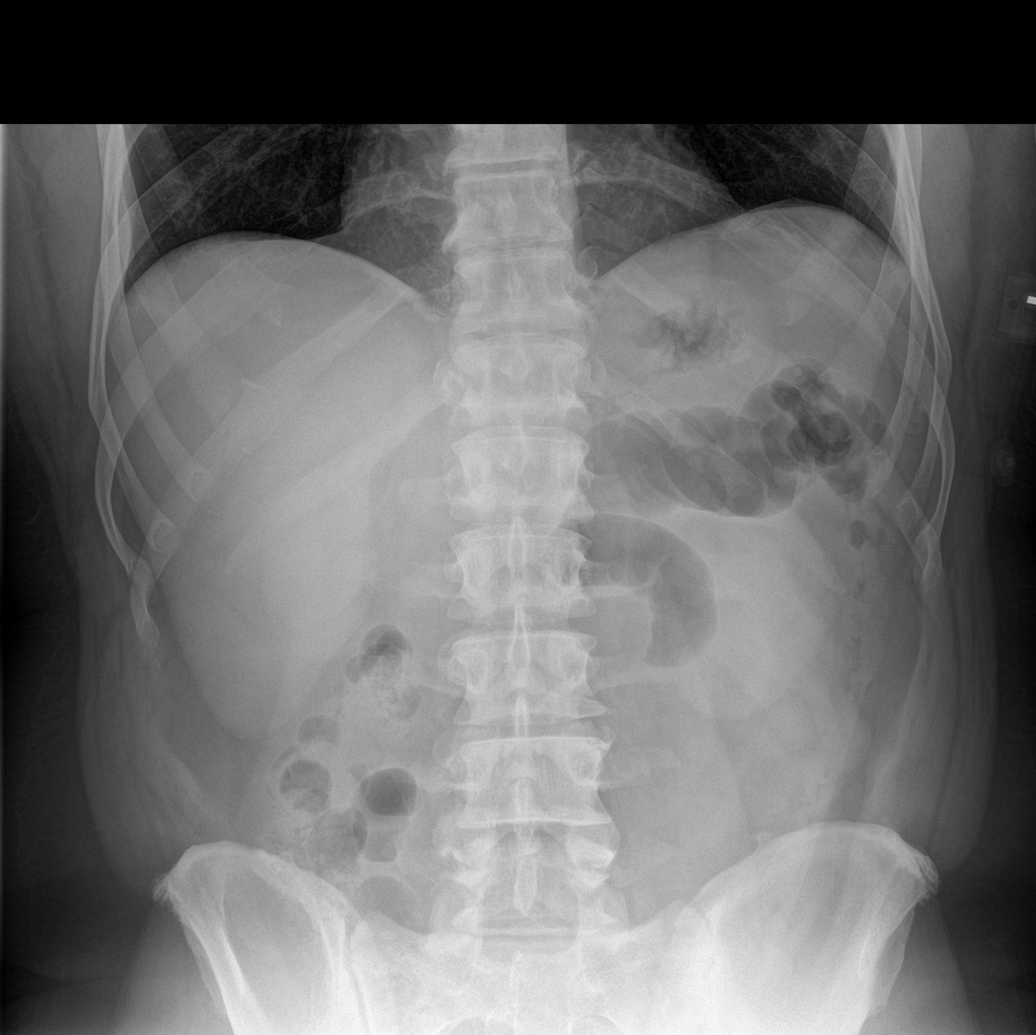

[abdomen supine (1 of 2)]
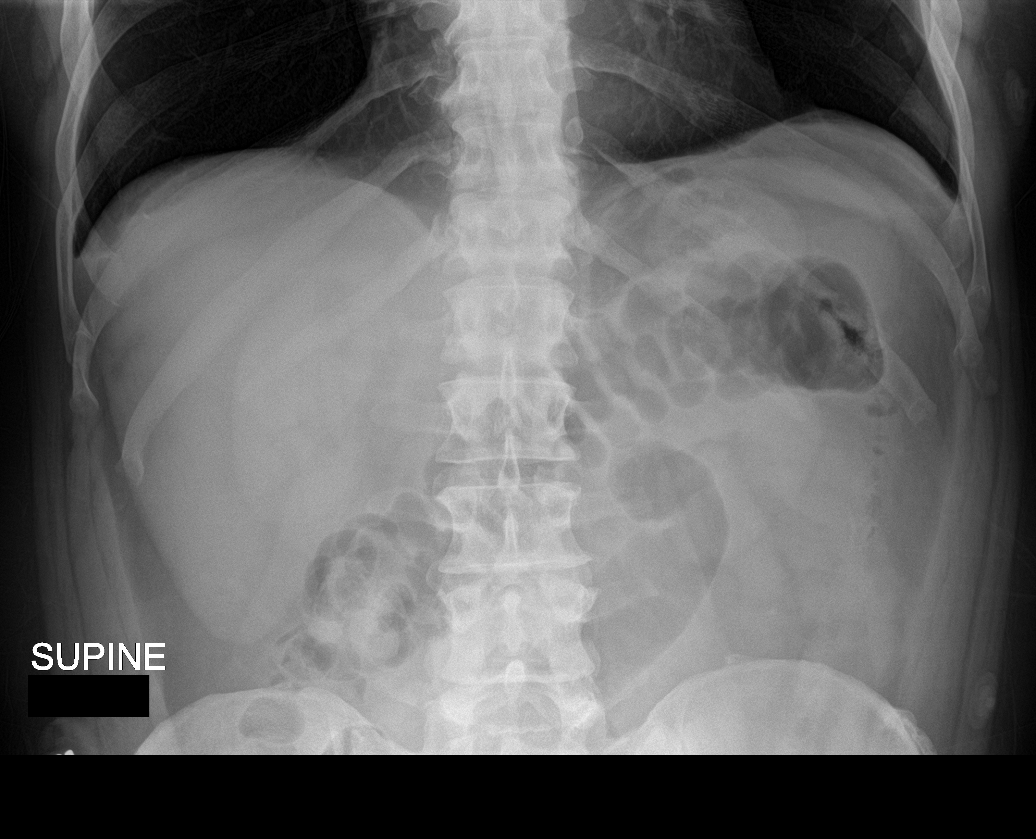

[abdomen supine (2 of 2)]
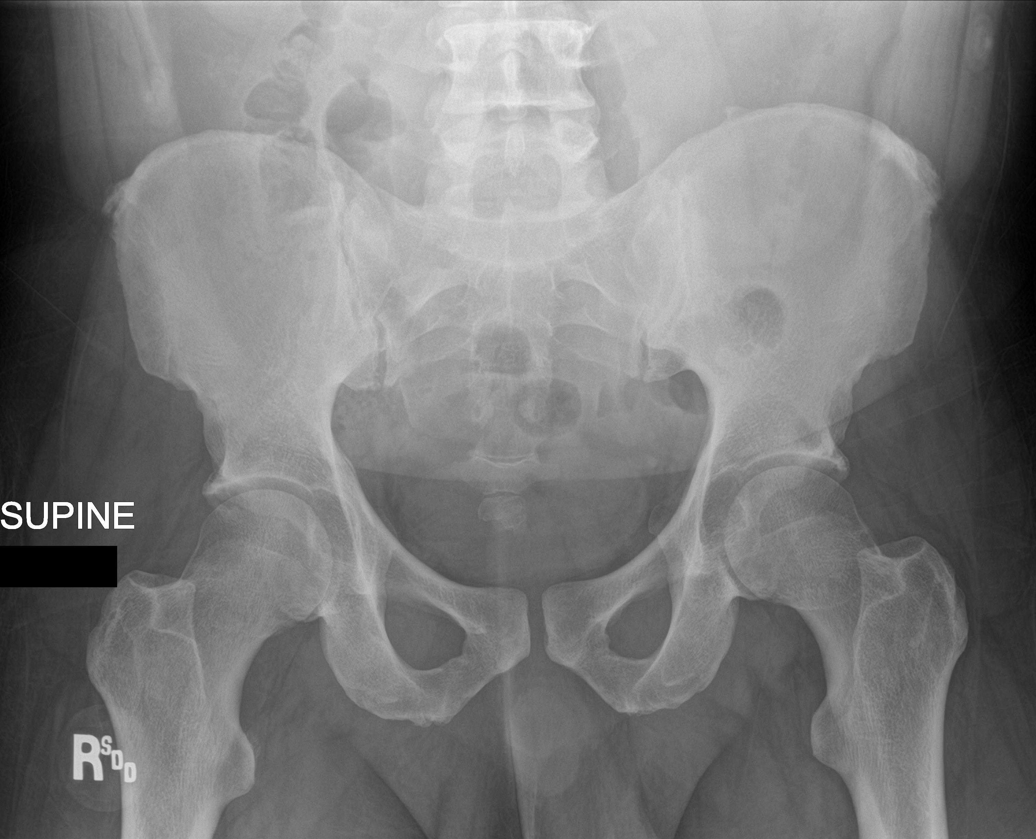

[4 of 4 positions shown; findings below may reference images not displayed]

FINDINGS: Cardiac shadow is within normal limits. The lungs are clear
bilaterally. No acute bony abnormality is noted.

Scattered large and small bowel gas is noted. No obstructive changes
are seen. No free air is seen. No significant retained fecal
material is noted. Bony structures show mild degenerative change of
the lumbar spine.
IMPRESSION: No acute abnormality in the chest and abdomen to correspond with the
given clinical history.

## 2022-11-17 ENCOUNTER — Other Ambulatory Visit: Payer: Self-pay

## 2022-11-17 ENCOUNTER — Encounter: Payer: Self-pay | Admitting: Intensive Care

## 2022-11-17 ENCOUNTER — Emergency Department: Payer: 59

## 2022-11-17 ENCOUNTER — Emergency Department
Admission: EM | Admit: 2022-11-17 | Discharge: 2022-11-17 | Disposition: A | Discharge status: Home / Self Care | Payer: 59 | Attending: Emergency Medicine | Admitting: Emergency Medicine

## 2022-11-17 DIAGNOSIS — I10 Essential (primary) hypertension: Secondary | ICD-10-CM | POA: Insufficient documentation

## 2022-11-17 DIAGNOSIS — R002 Palpitations: Secondary | ICD-10-CM

## 2022-11-17 DIAGNOSIS — Z0389 Encounter for observation for other suspected diseases and conditions ruled out: Secondary | ICD-10-CM | POA: Diagnosis not present

## 2022-11-17 LAB — BASIC METABOLIC PANEL
Anion gap: 9 (ref 5–15)
BUN: 10 mg/dL (ref 6–20)
CO2: 28 mmol/L (ref 22–32)
Calcium: 9.6 mg/dL (ref 8.9–10.3)
Chloride: 101 mmol/L (ref 98–111)
Creatinine, Ser: 1.14 mg/dL (ref 0.61–1.24)
GFR, Estimated: >60 mL/min (ref >60)
Glucose, Bld: 93 mg/dL (ref 70–99)
Potassium: 3.6 mmol/L (ref 3.5–5.1)
Sodium: 138 mmol/L (ref 135–145)

## 2022-11-17 LAB — CBC
HCT: 48.5 % (ref 39.0–52.0)
Hemoglobin: 16.4 g/dL (ref 13.0–17.0)
MCH: 29.7 pg (ref 26.0–34.0)
MCHC: 33.8 g/dL (ref 30.0–36.0)
MCV: 87.7 fL (ref 80.0–100.0)
Platelets: 320 10*3/uL (ref 150–400)
RBC: 5.53 MIL/uL (ref 4.22–5.81)
RDW: 13.2 % (ref 11.5–15.5)
WBC: 5.6 10*3/uL (ref 4.0–10.5)
nRBC: 0 % (ref 0.0–0.2)

## 2022-11-17 LAB — TROPONIN I (HIGH SENSITIVITY): Troponin I (High Sensitivity): 8 ng/L (ref <18)

## 2022-11-17 MED ORDER — LOSARTAN POTASSIUM 50 MG PO TABS: 50.0000 mg | ORAL_TABLET | Freq: Once | ORAL | Status: DC

## 2022-11-17 MED ORDER — LOSARTAN POTASSIUM 25 MG PO TABS
25.0000 mg | ORAL_TABLET | Freq: Every day | ORAL | 0 refills | Status: DC
  Filled 2022-11-17: qty 30, 30d supply, fill #0

## 2022-11-17 MED ORDER — LOSARTAN POTASSIUM 50 MG PO TABS
25.0000 mg | ORAL_TABLET | Freq: Once | ORAL | Status: AC
  Administered 2022-11-17: 25 mg via ORAL
  Filled 2022-11-17: qty 1

## 2022-11-17 NOTE — Discharge Instructions (Addendum)
Start taking the Losartan 25 mg nightly.  Start taking your blood pressure twice a day at around the same time.  IF blood pressure remains 123456 systolic you can increase the Losartan to 50 mg nightly.  Follow-up with your doctor in 1 week for labs and BP/check-up.

## 2022-11-17 NOTE — ED Provider Notes (Signed)
[facility] Provider Note    Event Date/Time   First MD Initiated Contact with Patient 11/17/22 1137     (approximate)   History   Tachycardia   HPI  Spencer Tanner is a 47 y.o. male here with symptomatic palpitations.  The patient states that earlier this morning, he began to feel a dull sensation in his chest and felt like he could see and feel his heart beating very regularly but very firmly.  He states he has a history of A-fib but did not have any symptoms similar to this.  He did not check his blood pressure.  Denies any other recent changes in health.  No shortness of breath.  No fevers or chills.  No sputum production.  No other complaints.     Physical Exam   Triage Vital Signs: ED Triage Vitals  Enc Vitals Group     BP 11/17/22 1054 (!) 151/90     Pulse Rate 11/17/22 1054 65     Resp 11/17/22 1054 16     Temp 11/17/22 1054 98.2 F (36.8 C)     Temp Source 11/17/22 1054 Oral     SpO2 11/17/22 1054 95 %     Weight 11/17/22 1054 201 lb (91.2 kg)     Height 11/17/22 1054 6' (1.829 m)     Head Circumference --      Peak Flow --      Pain Score 11/17/22 1112 0     Pain Loc --      Pain Edu? --      Excl. in [place]? --     Most recent vital signs: Vitals:   11/17/22 1054 11/17/22 1311  BP: (!) 151/90 136/63  Pulse: 65 60  Resp: 16 16  Temp: 98.2 F (36.8 C)   SpO2: 95% 98%     General: Awake, no distress.  CV:  Good peripheral perfusion.  No murmurs rubs.  No significant ectopy appreciated. Resp:  Normal work of breathing.  Abd:  No distention.  Other:  2+ pulses symmetrically bilaterally.   ED Results / Procedures / Treatments   Labs (all labs ordered are listed, but only abnormal results are displayed) Labs Reviewed  BASIC METABOLIC PANEL  CBC  TROPONIN I (HIGH SENSITIVITY)  TROPONIN I (HIGH SENSITIVITY)     EKG Normal sinus rhythm, ventricular rate 63.  PR 158, QRS 88, QTc 4 3.  No acute ST elevations or  depressions.  No acute joints of acute ischemia or infarct.   RADIOLOGY Chest x-ray: Clear   I also independently reviewed and agree with radiologist interpretations.   PROCEDURES:  Critical Care performed: No   MEDICATIONS ORDERED IN ED: Medications  losartan (COZAAR) tablet 25 mg (25 mg Oral Given by Other 11/17/22 1319)     IMPRESSION / MDM / ASSESSMENT AND PLAN / ED COURSE  I reviewed the triage vital signs and the nursing notes.                              Differential diagnosis includes, but is not limited to, symptomatic PVCs, hypertension, anxiety, A-fib, anemia  Patient's presentation is most consistent with acute presentation with potential threat to life or bodily function.  47 yo M with PMHx HTN here with symptomatic palpitations. Suspect possible mild symptomatic HTN vs anxiety. Pt is HDS and well appearing. CBC is unremarkable. BMP normal with normal renal function. EKG nonischemic with  no arrhythmia and troponin is negative - doubt ACS, ischemia. CXR is clear. Pt's sx seem to be a regular awareness of his BP and it does correlate with when his BP meds would be at their lowest. He was supposed to be on losartan but has not been taking it and BP elevated in ED.  Will resume his losartan and have him f/u with his Cardiologist for further evaluation.  FINAL CLINICAL IMPRESSION(S) / ED DIAGNOSES   Final diagnoses:  Palpitations     Rx / DC Orders   ED Discharge Orders          Ordered    losartan (COZAAR) 25 MG tablet  Daily        11/17/22 1249             Note:  This document was prepared using Dragon voice recognition software and may include unintentional dictation errors.   Duffy Bruce, MD 11/17/22 2134

## 2022-11-17 NOTE — ED Triage Notes (Signed)
Patient c/o "racing heart" that started this AM. Denies pain. He reports feeling like his heart is pumping through his chest.

## 2022-11-26 ENCOUNTER — Other Ambulatory Visit: Payer: Self-pay

## 2022-11-26 MED ORDER — DILTIAZEM HCL ER COATED BEADS 180 MG PO CP24
180.0000 mg | ORAL_CAPSULE | Freq: Every day | ORAL | 0 refills | Status: DC
  Filled 2022-11-26: qty 90, 90d supply, fill #0

## 2022-12-24 ENCOUNTER — Telehealth: Payer: Self-pay | Admitting: Cardiology

## 2022-12-24 NOTE — Telephone Encounter (Signed)
   Pre-operative Risk Assessment    Patient Name: Spencer Tanner  DOB: [birth year] MRN: [id number]     Request for Surgical Clearance    Procedure:  Dental Extraction - Amount of Teeth to be Pulled:  12 total but will only do 3 or 4 per visit   Date of Surgery:  Clearance TBD                                 Surgeon:  Carolynn Sayers Surgeon's Group or Practice Name:  [facility] Phone number:  [phone] Fax number:  [phone]   Type of Clearance Requested:   - Medical    Type of Anesthesia:   ordinarily local, with 2% lidocaine, 1:100,000 epinephrine    Additional requests/questions:  Please advise surgeon/provider what medications should be held.  Courtney Heys   12/24/2022, 4:31 PM

## 2022-12-24 NOTE — Telephone Encounter (Signed)
   Name: Spencer Tanner  DOB: 11/22/1975  MRN: [id number]  Primary Cardiologist: Debbe Odea, MD   Preoperative team, please contact this patient and set up a phone call appointment for further preoperative risk assessment. Please obtain consent and complete medication review. Thank you for your help.  I confirm that guidance regarding antiplatelet and oral anticoagulation therapy has been completed and, if necessary, noted below.  None requested.    Carlos Levering, NP 12/24/2022, 5:19 PM [facility]

## 2022-12-25 NOTE — Telephone Encounter (Signed)
Lvm for pt to call office to schedule telephone clearance.

## 2022-12-26 ENCOUNTER — Encounter: Payer: Self-pay | Admitting: Medical

## 2022-12-26 ENCOUNTER — Ambulatory Visit: Payer: 59 | Attending: Medical | Admitting: Medical

## 2022-12-26 ENCOUNTER — Telehealth: Payer: Self-pay | Admitting: Cardiology

## 2022-12-26 VITALS — BP 140/74 | HR 66 | Ht 73.0 in | Wt 220.0 lb

## 2022-12-26 DIAGNOSIS — I10 Essential (primary) hypertension: Secondary | ICD-10-CM

## 2022-12-26 DIAGNOSIS — Z01810 Encounter for preprocedural cardiovascular examination: Secondary | ICD-10-CM | POA: Diagnosis not present

## 2022-12-26 DIAGNOSIS — R002 Palpitations: Secondary | ICD-10-CM

## 2022-12-26 DIAGNOSIS — I480 Paroxysmal atrial fibrillation: Secondary | ICD-10-CM | POA: Diagnosis not present

## 2022-12-26 NOTE — Telephone Encounter (Signed)
I s/w the pt and we first scheduled a tele appt, then pt asked if there were any in office appt open. I offered the pt 3:35 today. Pt opts for in office appt today. I will cancel the tele appt. I will update all parties involved.

## 2022-12-26 NOTE — Patient Instructions (Signed)
Medication Instructions:  Your Physician recommend you continue on your current medication as directed.    *If you need a refill on your cardiac medications before your next appointment, please call your pharmacy*   Lab Work: None ordered today   Testing/Procedures: None ordered today   Follow-Up: At [facility], you and your health needs are our priority.  As part of our continuing mission to provide you with exceptional heart care, we have created designated Provider Care Teams.  These Care Teams include your primary Cardiologist (physician) and Advanced Practice Providers (APPs -  Physician Assistants and Nurse Practitioners) who all work together to provide you with the care you need, when you need it.  We recommend signing up for the patient portal called "MyChart".  Sign up information is provided on this After Visit Summary.  MyChart is used to connect with patients for Virtual Visits (Telemedicine).  Patients are able to view lab/test results, encounter notes, upcoming appointments, etc.  Non-urgent messages can be sent to your provider as well.   To learn more about what you can do with MyChart, go to [email or web address].    Your next appointment:   4 month(s)  Provider:   You may see Debbe Odea, MD or one of the following Advanced Practice Providers on your designated Care Team:   Nicolasa Ducking, NP Eula Listen, PA-C Cadence Fransico Michael, PA-C Charlsie Quest, NP

## 2022-12-26 NOTE — Progress Notes (Signed)
Cardiology Office Note:    Date:  12/26/2022   ID:  Spencer Tanner, DOB 19-Oct-1975, MRN [id number]  PCP:  Barbette Reichmann, MD  [facility] Cardiologist:  Debbe Odea, MD  [facility] Electrophysiologist:  None   Referring MD: Barbette Reichmann, MD   Chief Complaint: Pre-op visit  History of Present Illness:    Spencer Tanner is a 47 y.o. male with a hx of asthma, hypertension, whitecoat syndrome, paroxysmal A-fib for pre-op cardiovascular exam.  Patient was seen in the ED October 2022 for chest pain.  EKG showed A-fib with RVR.  Cardiac CTA October 2022 showed calcium score of 0, no evidence of CAD. Cardiac monitor November 2022 showed no A-fib or atrial flutter.  Echo in November 2022 showed EF of 60%.  Patient is not on anticoagulation with a CHA2DS2-VASc of 1.  Patient is on aspirin and diltiazem.  ER visit 11/17/2022 for palpitations.  Workup in the ER was essentially normal.  Heart rate was in the 60s blood pressure was mildly elevated.  EKG was normal sinus rhythm.  It was felt symptoms were from symptomatic hypertension or anxiety.  Today, the patient is planning on dental surgery, this has not been scheduled yet.  He is having 2 teeth removed.  He denies chest pain, shortness of breath, lower leg edema, palpitations, dizziness, or lightheadedness.  Blood pressure is mildly elevated today. ER visit was discussed. I recommended a heart monitor, however patient does not want to do this. As patient has no further palpitations, this is reasonable.  Past Medical History:  Diagnosis Date   A-fib (HCC)    Asthma    Hypertension     History reviewed. No pertinent surgical history.  Current Medications: Current Meds  Medication Sig   albuterol (VENTOLIN HFA) 108 (90 Base) MCG/ACT inhaler INHALE 2 PUFFS BY MOUTH EVERY 6 HOURS AS NEEDED FOR WHEEZING   budesonide-formoterol (SYMBICORT) 160-4.5 MCG/ACT inhaler INHALE 2 PUFFS BY MOUTH 2 TIMES DAILY    diltiazem (CARDIZEM CD) 180 MG 24 hr capsule Take 1 capsule (180 mg total) by mouth daily.   losartan (COZAAR) 25 MG tablet Take 1 tablet (25 mg total) by mouth daily.     Allergies:   Amoxicillin and Penicillins   Social History   Socioeconomic History   Marital status: Single    Spouse name: Not on file   Number of children: Not on file   Years of education: Not on file   Highest education level: Not on file  Occupational History   Not on file  Tobacco Use   Smoking status: Former   Smokeless tobacco: Never  Vaping Use   Vaping Use: Never used  Substance and Sexual Activity   Alcohol use: No    Alcohol/week: 0.0 standard drinks of alcohol   Drug use: No   Sexual activity: Not on file  Other Topics Concern   Not on file  Social History Narrative   Not on file   Social Determinants of Health   Financial Resource Strain: Not on file  Food Insecurity: Not on file  Transportation Needs: Not on file  Physical Activity: Not on file  Stress: Not on file  Social Connections: Not on file     Family History: The patient's family history is not on file.  ROS:   Please see the history of present illness.     All other systems reviewed and are negative.  EKGs/Labs/Other Studies Reviewed:    The following studies were reviewed today:  Echo 06/2021 1. Left ventricular ejection fraction, by estimation, is 60 to 65%. The  left ventricle has normal function. The left ventricle has no regional  wall motion abnormalities. Left ventricular diastolic parameters were  normal. The average left ventricular  global longitudinal strain is -17.6 %. The global longitudinal strain is  normal.   2. Right ventricular systolic function is normal. The right ventricular  size is normal.   3. The mitral valve is normal in structure. No evidence of mitral valve  regurgitation.   4. The aortic valve is tricuspid. Aortic valve regurgitation is not  visualized.   5. The inferior vena cava  is normal in size with greater than 50%  respiratory variability, suggesting right atrial pressure of 3 mmHg.   Heart monitor 06/2021 Patch Wear Time:  10 days and 1 hours (2022-10-14T09:22:15-0400 to 2022-10-24T10:29:51-0400)   Patient had a min HR of 37 bpm, max HR of 139 bpm, and avg HR of 75 bpm. Predominant underlying rhythm was Sinus Rhythm. Second Degree AV Block-Mobitz I (Wenckebach) was present. Junctional Rhythm was present. Isolated SVEs were rare (<1.0%), SVE  Couplets were rare (<1.0%), and SVE Triplets were rare (<1.0%). Isolated VEs were rare (<1.0%, 58), VE Couplets were rare (<1.0%, 2), and VE Triplets were rare (<1.0%, 1).  Benign cardiac monitor, no significant arrhythmias, no evidence of atrial fibrillation or atrial flutter.  Cardiac CTA 05/2021 IMPRESSION: 1. Coronary calcium score of 0. Patient is low risk for coronary events.   2. Normal coronary origin with right dominance.   3. No evidence of CAD.   4. CAD-RADS 0. No evidence of CAD (0%). Consider non-atherosclerotic causes of chest pain.   Electronically Signed: By: Debbe Odea M.D. On: 06/13/2021 14:08    EKG:  EKG is ordered today.  The ekg ordered today demonstrates NSR 66bpm, no St/T wave changes  Recent Labs: 12/29/2021: Magnesium 2.1 03/29/2022: ALT 55 11/17/2022: BUN 10; Creatinine, Ser 1.14; Hemoglobin 16.4; Platelets 320; Potassium 3.6; Sodium 138  Recent Lipid Panel No results found for: "CHOL", "TRIG", "HDL", "CHOLHDL", "VLDL", "LDLCALC", "LDLDIRECT"  Physical Exam:    VS:  BP (!) 140/74 (BP Location: Left Arm, Patient Position: Sitting, Cuff Size: Large)   Pulse 66   Ht 6\' 1"  (1.854 m)   Wt 220 lb (99.8 kg)   SpO2 99%   BMI 29.03 kg/m     Wt Readings from Last 3 Encounters:  12/26/22 220 lb (99.8 kg)  11/17/22 201 lb (91.2 kg)  08/10/22 210 lb (95.3 kg)     GEN:  Well nourished, well developed in no acute distress HEENT: Normal NECK: No JVD; No carotid bruits LYMPHATICS:  No lymphadenopathy CARDIAC: RRR, no murmurs, rubs, gallops RESPIRATORY:  Clear to auscultation without rales, wheezing or rhonchi  ABDOMEN: Soft, non-tender, non-distended MUSCULOSKELETAL:  No edema; No deformity  SKIN: Warm and dry NEUROLOGIC:  Alert and oriented x 3 PSYCHIATRIC:  Normal affect   ASSESSMENT:    1. Palpitations   2. Paroxysmal atrial fibrillation (HCC)   3. Essential hypertension   4. Pre-operative cardiovascular examination    PLAN:    In order of problems listed above:  Palpitations Recent ER visit for palpitations. BP was high and HR was in the 60s. EKG showed NSR. Labs were normal. It was suspected palpitations were from HTN vs anxiety. The patient denies any further palpitations. Patient does not want a heart monitor. We will continue to monitor symptoms.   HTN BP is mildly elevated, continue Losartan  and Cardizem 180mg  daily.   Paroxysmal Afib Patient has a history of Afib on Aspirin for CHADSVASC of 1. Continue Diltiazem 180mg  daily for rate control.  Pre-op evaluation for dental surgery Plan for dental surgery to remove 2 teeth, this has not been scheduled. Patient has a history of Afib on Aspirin with a CHADSVASC of 1. Prior echo in 2022 showed normal LVEF. Cardiac CTA showed no CAD. Patient denies chest pain, shortness of breath, lower leg edema, orthopnea, or pnd. Patient had a recent ER visit for palpitations with negative work-up. EKG today showed NSR. He denies any further symptoms. He feels palpitations were related to anxiety. Patient is not wanting to repeat a heart monitor at this time, which I think is reasonable. We will continue to monitor symptoms. According to RCRI the patient is low risk, 3.9% 30 day risk of death, MI or cardiac arrest.  It is OK to stop ASA perioperatively. No further cardiac work-up required prior to dental surgery.   Disposition: Follow up in 4 month(s) with MD/APP    Signed, Jujuan Dugo David Stall, PA-C  12/26/2022 4:11 PM     [facility]

## 2022-12-26 NOTE — Telephone Encounter (Signed)
Pt is returning phone call. Please advise

## 2022-12-31 ENCOUNTER — Other Ambulatory Visit: Payer: Self-pay

## 2023-01-01 ENCOUNTER — Telehealth: Payer: 59

## 2023-01-06 ENCOUNTER — Telehealth: Payer: Self-pay

## 2023-01-06 NOTE — Telephone Encounter (Signed)
Pre-op office visit notes from 5/10 faxed to Dr. Carolynn Sayers office.

## 2023-01-20 ENCOUNTER — Other Ambulatory Visit: Payer: Self-pay

## 2023-01-22 ENCOUNTER — Other Ambulatory Visit: Payer: Self-pay

## 2023-01-26 ENCOUNTER — Other Ambulatory Visit: Payer: Self-pay

## 2023-01-27 ENCOUNTER — Other Ambulatory Visit: Payer: Self-pay

## 2023-01-27 DIAGNOSIS — K0500 Acute gingivitis, plaque induced: Secondary | ICD-10-CM | POA: Diagnosis not present

## 2023-01-27 DIAGNOSIS — K219 Gastro-esophageal reflux disease without esophagitis: Secondary | ICD-10-CM | POA: Diagnosis not present

## 2023-01-27 DIAGNOSIS — K0381 Cracked tooth: Secondary | ICD-10-CM | POA: Diagnosis not present

## 2023-01-27 DIAGNOSIS — Z6828 Body mass index (BMI) 28.0-28.9, adult: Secondary | ICD-10-CM | POA: Diagnosis not present

## 2023-01-27 DIAGNOSIS — I480 Paroxysmal atrial fibrillation: Secondary | ICD-10-CM | POA: Diagnosis not present

## 2023-01-27 DIAGNOSIS — J4520 Mild intermittent asthma, uncomplicated: Secondary | ICD-10-CM | POA: Diagnosis not present

## 2023-01-27 DIAGNOSIS — J4540 Moderate persistent asthma, uncomplicated: Secondary | ICD-10-CM | POA: Diagnosis not present

## 2023-01-27 DIAGNOSIS — R002 Palpitations: Secondary | ICD-10-CM | POA: Diagnosis not present

## 2023-01-27 DIAGNOSIS — R7309 Other abnormal glucose: Secondary | ICD-10-CM | POA: Diagnosis not present

## 2023-01-27 DIAGNOSIS — I10 Essential (primary) hypertension: Secondary | ICD-10-CM | POA: Diagnosis not present

## 2023-01-27 MED ORDER — ALBUTEROL SULFATE HFA 108 (90 BASE) MCG/ACT IN AERS
2.0000 | INHALATION_SPRAY | Freq: Four times a day (QID) | RESPIRATORY_TRACT | 5 refills | Status: DC | PRN
  Filled 2023-01-27: qty 6.7, 25d supply, fill #0
  Filled 2023-02-20: qty 6.7, 30d supply, fill #0
  Filled 2023-03-18: qty 6.7, 30d supply, fill #1
  Filled 2023-04-22: qty 6.7, 30d supply, fill #2
  Filled 2023-05-13 – 2023-05-18 (×2): qty 6.7, 30d supply, fill #3
  Filled 2023-05-27 – 2023-06-10 (×2): qty 6.7, 30d supply, fill #4
  Filled 2023-07-03 – 2023-07-06 (×2): qty 6.7, 30d supply, fill #5

## 2023-01-27 MED ORDER — CLINDAMYCIN HCL 300 MG PO CAPS
300.0000 mg | ORAL_CAPSULE | Freq: Three times a day (TID) | ORAL | 0 refills | Status: DC
  Filled 2023-01-27: qty 30, 10d supply, fill #0

## 2023-01-27 MED ORDER — PANTOPRAZOLE SODIUM 40 MG PO TBEC
40.0000 mg | DELAYED_RELEASE_TABLET | Freq: Every day | ORAL | 1 refills | Status: DC
  Filled 2023-01-27: qty 90, 90d supply, fill #0
  Filled 2023-09-16: qty 90, 90d supply, fill #1

## 2023-01-27 MED ORDER — LOSARTAN POTASSIUM 25 MG PO TABS
25.0000 mg | ORAL_TABLET | Freq: Every day | ORAL | 1 refills | Status: DC
  Filled 2023-01-27: qty 90, 90d supply, fill #0
  Filled 2023-08-07: qty 90, 90d supply, fill #1

## 2023-01-27 MED ORDER — DILTIAZEM HCL ER COATED BEADS 180 MG PO CP24
180.0000 mg | ORAL_CAPSULE | Freq: Every day | ORAL | 0 refills | Status: DC
  Filled 2023-01-27 – 2023-03-18 (×2): qty 90, 90d supply, fill #0

## 2023-01-27 MED ORDER — BUDESONIDE-FORMOTEROL FUMARATE 160-4.5 MCG/ACT IN AERO
2.0000 | INHALATION_SPRAY | Freq: Two times a day (BID) | RESPIRATORY_TRACT | 5 refills | Status: DC
  Filled 2023-01-27 – 2023-02-20 (×2): qty 10.2, 30d supply, fill #0
  Filled 2023-03-18: qty 10.2, 30d supply, fill #1
  Filled 2023-04-22: qty 10.2, 30d supply, fill #2
  Filled 2023-05-27: qty 10.2, 30d supply, fill #3
  Filled 2023-06-24: qty 10.2, 30d supply, fill #4
  Filled 2023-07-24 (×2): qty 10.2, 30d supply, fill #5

## 2023-01-27 NOTE — Telephone Encounter (Signed)
Patient states notes were not received need status to schedule dental procedure. Please assist.

## 2023-01-27 NOTE — Telephone Encounter (Signed)
   Patient Name: Spencer Tanner  DOB: [birth year] MRN: [id number]  Primary Cardiologist: Debbe Odea, MD  Chart reviewed as part of pre-operative protocol coverage. Pre-op clearance already addressed by colleagues in earlier phone notes. To summarize recommendations:  -Pre-op evaluation for dental surgery Plan for dental surgery to remove 2 teeth, this has not been scheduled. Patient has a history of Afib on Aspirin with a CHADSVASC of 1. Prior echo in 2022 showed normal LVEF. Cardiac CTA showed no CAD. Patient denies chest pain, shortness of breath, lower leg edema, orthopnea, or pnd. Patient had a recent ER visit for palpitations with negative work-up. EKG today showed NSR. He denies any further symptoms. He feels palpitations were related to anxiety. Patient is not wanting to repeat a heart monitor at this time, which I think is reasonable. We will continue to monitor symptoms. According to RCRI the patient is low risk, 3.9% 30 day risk of death, MI or cardiac arrest.  It is OK to stop ASA perioperatively. No further cardiac work-up required prior to dental surgery.   -Cadence Fransico Michael, PA-C    Will route this bundled recommendation to requesting provider via Epic fax function and remove from pre-op pool. Please call with questions.  Sharlene Dory, PA-C 01/27/2023, 1:46 PM

## 2023-01-28 ENCOUNTER — Other Ambulatory Visit: Payer: Self-pay

## 2023-01-30 ENCOUNTER — Telehealth: Payer: Self-pay | Admitting: Cardiology

## 2023-01-30 NOTE — Telephone Encounter (Signed)
Spoke with Marchelle Folks and she confirmed that she received the updated clearance that Danielle faxed to their office.

## 2023-01-30 NOTE — Telephone Encounter (Signed)
Follow Up:   Marchelle Folks is calling to check on the status of patient's clearance. She needs this asap please. Please fax [phone].

## 2023-01-30 NOTE — Telephone Encounter (Signed)
Called Marchelle Folks back at [phone] vm full. Faxed completed ov note from [facility] that cleared pt for upcoming teeth removal.

## 2023-02-20 ENCOUNTER — Other Ambulatory Visit: Payer: Self-pay

## 2023-03-18 ENCOUNTER — Other Ambulatory Visit: Payer: Self-pay

## 2023-04-17 ENCOUNTER — Other Ambulatory Visit: Payer: Self-pay

## 2023-04-17 MED ORDER — CLINDAMYCIN HCL 300 MG PO CAPS
ORAL_CAPSULE | ORAL | 0 refills | Status: DC
  Filled 2023-04-17: qty 30, 10d supply, fill #0

## 2023-04-22 ENCOUNTER — Other Ambulatory Visit: Payer: Self-pay

## 2023-05-08 ENCOUNTER — Encounter: Payer: Self-pay | Admitting: Medical

## 2023-05-08 ENCOUNTER — Ambulatory Visit: Payer: 59 | Attending: Medical | Admitting: Medical

## 2023-05-08 VITALS — BP 148/86 | HR 72 | Ht 72.0 in | Wt 216.2 lb

## 2023-05-08 DIAGNOSIS — I480 Paroxysmal atrial fibrillation: Secondary | ICD-10-CM

## 2023-05-08 DIAGNOSIS — R002 Palpitations: Secondary | ICD-10-CM | POA: Diagnosis not present

## 2023-05-08 DIAGNOSIS — I10 Essential (primary) hypertension: Secondary | ICD-10-CM

## 2023-05-08 NOTE — Patient Instructions (Signed)
Medication Instructions:  Your physician recommends the following medication changes.  START TAKING: Losartan 25 mg by mouth daily  *If you need a refill on your cardiac medications before your next appointment, please call your pharmacy*   Lab Work: No labs ordered today    Testing/Procedures: No test ordered today    Follow-Up: At [facility], you and your health needs are our priority.  As part of our continuing mission to provide you with exceptional heart care, we have created designated Provider Care Teams.  These Care Teams include your primary Cardiologist (physician) and Advanced Practice Providers (APPs -  Physician Assistants and Nurse Practitioners) who all work together to provide you with the care you need, when you need it.  We recommend signing up for the patient portal called "MyChart".  Sign up information is provided on this After Visit Summary.  MyChart is used to connect with patients for Virtual Visits (Telemedicine).  Patients are able to view lab/test results, encounter notes, upcoming appointments, etc.  Non-urgent messages can be sent to your provider as well.   To learn more about what you can do with MyChart, go to [email or web address].    Your next appointment:   6 month(s)  Provider:   You may see Debbe Odea, MD or one of the following Advanced Practice Providers on your designated Care Team:   Nicolasa Ducking, NP Eula Listen, PA-C Cadence Fransico Michael, PA-C Charlsie Quest, NP

## 2023-05-08 NOTE — Progress Notes (Signed)
Cardiology Office Note:    Date:  05/08/2023   ID:  [name], DOB 09/26/1975, MRN [id number]  PCP:  Barbette Reichmann, MD  [facility] Cardiologist:  Debbe Odea, MD  [facility] Electrophysiologist:  None   Referring MD: Barbette Reichmann, MD   Chief Complaint: 4 month follow-up  History of Present Illness:    Spencer Tanner is a 47 y.o. male with a hx of asthma, hypertension, whitecoat syndrome, paroxysmal A-fib for 4 month follow-up.   Patient was seen in the ED October 2022 for chest pain.  EKG showed A-fib with RVR.  Cardiac CTA October 2022 showed calcium score of 0, no evidence of CAD. Cardiac monitor November 2022 showed no A-fib or atrial flutter.  Echo in November 2022 showed EF of 60%.   Patient is not on anticoagulation with a CHA2DS2-VASc of 1.  Patient is on aspirin and diltiazem.  ER visit 11/17/2022 for palpitations. Workup in the ER was essentially normal. Heart rate was in the 60s blood pressure was mildly elevated. EKG was normal sinus rhythm. It was felt symptoms were from symptomatic hypertension or anxiety.   Patient was last seen 12/26/2022 for preop visit for dental surgery (removal of 2 teeth).  No further cardiac workup was recommended.  Today, the patient reports he is overall doing well. He denies palpitations and heart racing. No chest pain or shortness of breath. He has occasional dependent edema. BP is mildly elevated. He only takes Losartan as needed. Diet is better, he is trying to eat healthier.   Past Medical History:  Diagnosis Date   A-fib (HCC)    Asthma    Hypertension     History reviewed. No pertinent surgical history.  Current Medications: Current Meds  Medication Sig   albuterol (VENTOLIN HFA) 108 (90 Base) MCG/ACT inhaler INHALE 2 PUFFS BY MOUTH EVERY 6 HOURS AS NEEDED FOR WHEEZING   albuterol (VENTOLIN HFA) 108 (90 Base) MCG/ACT inhaler Inhale 2 puffs into the lungs every 6 (six) hours as needed.    budesonide-formoterol (SYMBICORT) 160-4.5 MCG/ACT inhaler Inhale 2 puffs into the lungs 2 (two) times daily.   clindamycin (CLEOCIN) 300 MG capsule Take 1 capsule (300 mg total) by mouth 3 (three) times daily for 10 days   diltiazem (CARDIZEM CD) 180 MG 24 hr capsule Take 1 capsule (180 mg total) by mouth daily.   losartan (COZAAR) 25 MG tablet Take 1 tablet (25 mg total) by mouth daily.   pantoprazole (PROTONIX) 40 MG tablet Take 1 tablet (40 mg total) by mouth daily.     Allergies:   Amoxicillin and Penicillins   Social History   Socioeconomic History   Marital status: Single    Spouse name: Not on file   Number of children: Not on file   Years of education: Not on file   Highest education level: Not on file  Occupational History   Not on file  Tobacco Use   Smoking status: Former   Smokeless tobacco: Never  Vaping Use   Vaping status: Never Used  Substance and Sexual Activity   Alcohol use: No    Alcohol/week: 0.0 standard drinks of alcohol   Drug use: No   Sexual activity: Not on file  Other Topics Concern   Not on file  Social History Narrative   Not on file   Social Determinants of Health   Financial Resource Strain: Not on file  Food Insecurity: Not on file  Transportation Needs: Not on file  Physical Activity: Not  on file  Stress: Not on file  Social Connections: Not on file     Family History: The patient's family history is not on file.  ROS:   Please see the history of present illness.     All other systems reviewed and are negative.  EKGs/Labs/Other Studies Reviewed:    The following studies were reviewed today:  Echo 06/2021 1. Left ventricular ejection fraction, by estimation, is 60 to 65%. The  left ventricle has normal function. The left ventricle has no regional  wall motion abnormalities. Left ventricular diastolic parameters were  normal. The average left ventricular  global longitudinal strain is -17.6 %. The global longitudinal strain  is  normal.   2. Right ventricular systolic function is normal. The right ventricular  size is normal.   3. The mitral valve is normal in structure. No evidence of mitral valve  regurgitation.   4. The aortic valve is tricuspid. Aortic valve regurgitation is not  visualized.   5. The inferior vena cava is normal in size with greater than 50%  respiratory variability, suggesting right atrial pressure of 3 mmHg.    Heart monitor 06/2021 Patch Wear Time:  10 days and 1 hours (2022-10-14T09:22:15-0400 to 2022-10-24T10:29:51-0400)   Patient had a min HR of 37 bpm, max HR of 139 bpm, and avg HR of 75 bpm. Predominant underlying rhythm was Sinus Rhythm. Second Degree AV Block-Mobitz I (Wenckebach) was present. Junctional Rhythm was present. Isolated SVEs were rare (<1.0%), SVE  Couplets were rare (<1.0%), and SVE Triplets were rare (<1.0%). Isolated VEs were rare (<1.0%, 58), VE Couplets were rare (<1.0%, 2), and VE Triplets were rare (<1.0%, 1).  Benign cardiac monitor, no significant arrhythmias, no evidence of atrial fibrillation or atrial flutter.   Cardiac CTA 05/2021 IMPRESSION: 1. Coronary calcium score of 0. Patient is low risk for coronary events.   2. Normal coronary origin with right dominance.   3. No evidence of CAD.   4. CAD-RADS 0. No evidence of CAD (0%). Consider non-atherosclerotic causes of chest pain.   Electronically Signed: By: Debbe Odea M.D. On: 06/13/2021 14:08    EKG:  EKG is not ordered today.   Recent Labs: 11/17/2022: BUN 10; Creatinine, Ser 1.14; Hemoglobin 16.4; Platelets 320; Potassium 3.6; Sodium 138  Recent Lipid Panel No results found for: "CHOL", "TRIG", "HDL", "CHOLHDL", "VLDL", "LDLCALC", "LDLDIRECT"  Physical Exam:    VS:  BP (!) 148/86 (BP Location: Left Arm, Patient Position: Sitting)   Pulse 72   Ht 6' (1.829 m)   Wt 216 lb 3.2 oz (98.1 kg)   SpO2 97%   BMI 29.32 kg/m     Wt Readings from Last 3 Encounters:  05/08/23 216 lb  3.2 oz (98.1 kg)  12/26/22 220 lb (99.8 kg)  11/17/22 201 lb (91.2 kg)     GEN:  Well nourished, well developed in no acute distress HEENT: Normal NECK: No JVD; No carotid bruits LYMPHATICS: No lymphadenopathy CARDIAC: RRR, no murmurs, rubs, gallops RESPIRATORY:  Clear to auscultation without rales, wheezing or rhonchi  ABDOMEN: Soft, non-tender, non-distended MUSCULOSKELETAL:  No edema; No deformity  SKIN: Warm and dry NEUROLOGIC:  Alert and oriented x 3 PSYCHIATRIC:  Normal affect   ASSESSMENT:    1. Palpitations   2. Essential hypertension   3. Paroxysmal atrial fibrillation (HCC)    PLAN:    In order of problems listed above:  Palpitations Patient denies palpitations or heart racing. He is careful about what he eats and  drinks to avoid symptoms.   HTN BP is mildly elevated today. The patient reports he is taking Losartan as needed. I recommended he take Losartan 25mg  every day. Continue Diltiazem 180mg  daily. I recommended he check blood pressure at home.  Paroxysmal Afib Patient has a history of Afib on Aspirin for CHADSVASC of 1. Continue Diltiazem 180mg  daily for rate control.  Disposition: Follow up in 6 month(s) with MD/APP    Signed, Antion Andres David Stall, PA-C  05/08/2023 8:27 AM    [facility]

## 2023-05-13 ENCOUNTER — Other Ambulatory Visit: Payer: Self-pay

## 2023-05-27 ENCOUNTER — Other Ambulatory Visit: Payer: Self-pay

## 2023-06-02 ENCOUNTER — Other Ambulatory Visit: Payer: Self-pay

## 2023-06-02 MED ORDER — DILTIAZEM HCL ER COATED BEADS 180 MG PO CP24
180.0000 mg | ORAL_CAPSULE | Freq: Every day | ORAL | 1 refills | Status: DC
  Filled 2023-06-02: qty 90, 90d supply, fill #0
  Filled 2023-09-16: qty 90, 90d supply, fill #1

## 2023-06-02 MED ORDER — GABAPENTIN 100 MG PO CAPS
100.0000 mg | ORAL_CAPSULE | Freq: Two times a day (BID) | ORAL | 1 refills | Status: AC
  Filled 2023-06-02: qty 180, 90d supply, fill #0
  Filled 2024-02-06: qty 180, 90d supply, fill #1

## 2023-06-10 ENCOUNTER — Other Ambulatory Visit: Payer: Self-pay

## 2023-06-11 ENCOUNTER — Other Ambulatory Visit: Payer: Self-pay

## 2023-06-24 ENCOUNTER — Other Ambulatory Visit: Payer: Self-pay

## 2023-07-03 ENCOUNTER — Other Ambulatory Visit: Payer: Self-pay

## 2023-07-24 ENCOUNTER — Other Ambulatory Visit: Payer: Self-pay

## 2023-07-27 ENCOUNTER — Other Ambulatory Visit: Payer: Self-pay

## 2023-07-27 MED ORDER — ALBUTEROL SULFATE HFA 108 (90 BASE) MCG/ACT IN AERS
2.0000 | INHALATION_SPRAY | Freq: Four times a day (QID) | RESPIRATORY_TRACT | 5 refills | Status: DC | PRN
  Filled 2023-07-27: qty 6.7, 25d supply, fill #0
  Filled 2023-07-28 – 2023-07-30 (×2): qty 6.7, 30d supply, fill #0
  Filled 2023-08-31: qty 6.7, 30d supply, fill #1
  Filled 2023-09-14 – 2023-10-05 (×2): qty 6.7, 30d supply, fill #2
  Filled 2023-10-28 – 2023-10-29 (×2): qty 6.7, 30d supply, fill #3

## 2023-07-28 ENCOUNTER — Other Ambulatory Visit: Payer: Self-pay

## 2023-07-30 ENCOUNTER — Other Ambulatory Visit: Payer: Self-pay

## 2023-08-07 ENCOUNTER — Other Ambulatory Visit: Payer: Self-pay

## 2023-08-21 ENCOUNTER — Other Ambulatory Visit: Payer: Self-pay

## 2023-08-21 MED ORDER — BUDESONIDE-FORMOTEROL FUMARATE 160-4.5 MCG/ACT IN AERO
2.0000 | INHALATION_SPRAY | Freq: Two times a day (BID) | RESPIRATORY_TRACT | 5 refills | Status: DC
  Filled 2023-08-21: qty 10.2, 30d supply, fill #0
  Filled 2023-09-14: qty 10.2, 30d supply, fill #1
  Filled 2023-10-16: qty 10.2, 30d supply, fill #2
  Filled 2023-11-17: qty 10.2, 30d supply, fill #3
  Filled 2023-12-12: qty 10.2, 30d supply, fill #4
  Filled 2024-01-13: qty 10.2, 30d supply, fill #5

## 2023-08-31 ENCOUNTER — Other Ambulatory Visit: Payer: Self-pay

## 2023-09-14 ENCOUNTER — Other Ambulatory Visit: Payer: Self-pay

## 2023-09-16 ENCOUNTER — Other Ambulatory Visit: Payer: Self-pay

## 2023-09-17 ENCOUNTER — Other Ambulatory Visit: Payer: Self-pay

## 2023-09-17 MED ORDER — CLINDAMYCIN HCL 300 MG PO CAPS
ORAL_CAPSULE | Freq: Three times a day (TID) | ORAL | 0 refills | Status: DC
  Filled 2023-09-17: qty 30, 10d supply, fill #0

## 2023-10-05 ENCOUNTER — Other Ambulatory Visit: Payer: Self-pay

## 2023-10-06 DIAGNOSIS — Z1211 Encounter for screening for malignant neoplasm of colon: Secondary | ICD-10-CM | POA: Diagnosis not present

## 2023-10-06 DIAGNOSIS — R748 Abnormal levels of other serum enzymes: Secondary | ICD-10-CM | POA: Diagnosis not present

## 2023-10-06 DIAGNOSIS — Z599 Problem related to housing and economic circumstances, unspecified: Secondary | ICD-10-CM | POA: Diagnosis not present

## 2023-10-12 DIAGNOSIS — Z1211 Encounter for screening for malignant neoplasm of colon: Secondary | ICD-10-CM | POA: Diagnosis not present

## 2023-10-13 DIAGNOSIS — R748 Abnormal levels of other serum enzymes: Secondary | ICD-10-CM | POA: Diagnosis not present

## 2023-10-16 ENCOUNTER — Other Ambulatory Visit: Payer: Self-pay

## 2023-10-18 LAB — EXTERNAL GENERIC LAB PROCEDURE: COLOGUARD: NEGATIVE

## 2023-10-18 LAB — COLOGUARD: COLOGUARD: NEGATIVE

## 2023-10-28 ENCOUNTER — Other Ambulatory Visit: Payer: Self-pay

## 2023-11-05 ENCOUNTER — Ambulatory Visit: Payer: 59 | Attending: Cardiology | Admitting: Cardiology

## 2023-11-10 ENCOUNTER — Encounter: Payer: Self-pay | Admitting: Medical

## 2023-11-10 ENCOUNTER — Ambulatory Visit: Attending: Medical | Admitting: Medical

## 2023-11-10 VITALS — BP 136/84 | HR 71 | Ht 72.0 in | Wt 212.0 lb

## 2023-11-10 DIAGNOSIS — I480 Paroxysmal atrial fibrillation: Secondary | ICD-10-CM | POA: Diagnosis not present

## 2023-11-10 DIAGNOSIS — K219 Gastro-esophageal reflux disease without esophagitis: Secondary | ICD-10-CM

## 2023-11-10 DIAGNOSIS — I10 Essential (primary) hypertension: Secondary | ICD-10-CM | POA: Diagnosis not present

## 2023-11-10 NOTE — Patient Instructions (Signed)
 Medication Instructions:  Your physician recommends that you continue on your current medications as directed. Please refer to the Current Medication list given to you today.   *If you need a refill on your cardiac medications before your next appointment, please call your pharmacy*   Lab Work: No labs ordered today    Testing/Procedures: No test ordered today    Follow-Up: At [facility], you and your health needs are our priority.  As part of our continuing mission to provide you with exceptional heart care, we have created designated Provider Care Teams.  These Care Teams include your primary Cardiologist (physician) and Advanced Practice Providers (APPs -  Physician Assistants and Nurse Practitioners) who all work together to provide you with the care you need, when you need it.  We recommend signing up for the patient portal called "MyChart".  Sign up information is provided on this After Visit Summary.  MyChart is used to connect with patients for Virtual Visits (Telemedicine).  Patients are able to view lab/test results, encounter notes, upcoming appointments, etc.  Non-urgent messages can be sent to your provider as well.   To learn more about what you can do with MyChart, go to [email or web address].    Your next appointment:   1 year(s)  Provider:   You may see Debbe Odea, MD or one of the following Advanced Practice Providers on your designated Care Team:   Nicolasa Ducking, NP Eula Listen, PA-C Cadence Fransico Michael, PA-C Charlsie Quest, NP Carlos Levering, NP

## 2023-11-10 NOTE — Progress Notes (Signed)
 Cardiology Office Note:  .   Date:  11/10/2023  ID:  Spencer Tanner, DOB 08/24/1975, MRN [id number] PCP: Barbette Reichmann, MD  [place] [facility] Providers Cardiologist:  Debbe Odea, MD {    History of Present Illness: .   Spencer Tanner is a 48 y.o. male with a hx of asthma, hypertension, whitecoat syndrome, paroxysmal A-fib for 6- month follow-up for A-fib.   Patient was seen in the ED October 2022 for chest pain.  EKG showed A-fib with RVR.  Cardiac CTA October 2022 showed calcium score of 0, no evidence of CAD. Cardiac monitor November 2022 showed no A-fib or atrial flutter.  Echo in November 2022 showed EF of 60%.   Patient is not on anticoagulation with a CHA2DS2-VASc of 1.  Patient is on aspirin and diltiazem.   ER visit 11/17/2022 for palpitations. Workup in the ER was essentially normal. Heart rate was in the 60s blood pressure was mildly elevated. EKG was normal sinus rhythm. It was felt symptoms were from symptomatic hypertension or anxiety.   The patient was last seen in September 2024 and was overall doing well from a cardiac perspective.  Today, the patient rpeorts chest sensation in the left side of his chest. It occurs after he heats or drinks. Feels like reflux but not as bad. Sometimes feels like pinch. No exertional symptoms. Discomfort is only with certain. He only takes PPI as needed. He works out on the weekend. He denies palpitations or heart racing.    Studies Reviewed: Marland Kitchen   EKG Interpretation Date/Time:  Tuesday November 10 2023 08:36:35 EDT Ventricular Rate:  71 PR Interval:  144 QRS Duration:  90 QT Interval:  384 QTC Calculation: 417 R Axis:   51  Text Interpretation: Normal sinus rhythm with sinus arrhythmia Normal ECG When compared with ECG of 17-Nov-2022 10:48, No significant change was found Confirmed by Fransico Michael, [place] ([id number]) on 11/10/2023 8:50:34 AM    Echo 06/2021 1. Left ventricular ejection fraction, by estimation, is 60 to  65%. The  left ventricle has normal function. The left ventricle has no regional  wall motion abnormalities. Left ventricular diastolic parameters were  normal. The average left ventricular  global longitudinal strain is -17.6 %. The global longitudinal strain is  normal.   2. Right ventricular systolic function is normal. The right ventricular  size is normal.   3. The mitral valve is normal in structure. No evidence of mitral valve  regurgitation.   4. The aortic valve is tricuspid. Aortic valve regurgitation is not  visualized.   5. The inferior vena cava is normal in size with greater than 50%  respiratory variability, suggesting right atrial pressure of 3 mmHg.    Heart monitor 06/2021 Patch Wear Time:  10 days and 1 hours (2022-10-14T09:22:15-0400 to 2022-10-24T10:29:51-0400)   Patient had a min HR of 37 bpm, max HR of 139 bpm, and avg HR of 75 bpm. Predominant underlying rhythm was Sinus Rhythm. Second Degree AV Block-Mobitz I (Wenckebach) was present. Junctional Rhythm was present. Isolated SVEs were rare (<1.0%), SVE  Couplets were rare (<1.0%), and SVE Triplets were rare (<1.0%). Isolated VEs were rare (<1.0%, 58), VE Couplets were rare (<1.0%, 2), and VE Triplets were rare (<1.0%, 1).  Benign cardiac monitor, no significant arrhythmias, no evidence of atrial fibrillation or atrial flutter.   Cardiac CTA 05/2021 IMPRESSION: 1. Coronary calcium score of 0. Patient is low risk for coronary events.   2. Normal coronary origin with right dominance.   3.  No evidence of CAD.   4. CAD-RADS 0. No evidence of CAD (0%). Consider non-atherosclerotic causes of chest pain.   Electronically Signed: By: Debbe Odea M.D. On: 06/13/2021 14:08      Physical Exam:   VS:  BP 136/84   Pulse 71   Ht 6' (1.829 m)   Wt 212 lb (96.2 kg)   SpO2 98%   BMI 28.75 kg/m    Wt Readings from Last 3 Encounters:  11/10/23 212 lb (96.2 kg)  05/08/23 216 lb 3.2 oz (98.1 kg)  12/26/22  220 lb (99.8 kg)    GEN: Well nourished, well developed in no acute distress NECK: No JVD; No carotid bruits CARDIAC: RRR, no murmurs, rubs, gallops RESPIRATORY:  Clear to auscultation without rales, wheezing or rhonchi  ABDOMEN: Soft, non-tender, non-distended EXTREMITIES:  No edema; No deformity   ASSESSMENT AND PLAN: .    GERD The patient reports chest discomfort after eating certain foods. No exertional symptoms. Cardiac CTA in 2022 showed coronary calcium score of 0. Suspect GI etiology. He takes Protonix only as needed, I recommend he take PPI every day.   HTN BP good today. Continue Losartan 25mg  daily and Diltiazem 180mg  daily.   Paroxysmal Afib The patient is in NSR on EKG today. CHADSVASC of 1. He is on ASA 81mg  daily. Continue Diltiazem 180mg  daily for rate control.      Dispo: Follow-up in 1 year  Signed, Kaydense Rizo David Stall, PA-C

## 2023-11-11 ENCOUNTER — Other Ambulatory Visit: Payer: Self-pay

## 2023-11-12 ENCOUNTER — Other Ambulatory Visit: Payer: Self-pay

## 2023-11-12 ENCOUNTER — Other Ambulatory Visit (HOSPITAL_COMMUNITY): Payer: Self-pay

## 2023-11-12 ENCOUNTER — Other Ambulatory Visit: Payer: Self-pay | Admitting: Emergency Medicine

## 2023-11-12 MED ORDER — LOSARTAN POTASSIUM 25 MG PO TABS
25.0000 mg | ORAL_TABLET | Freq: Every day | ORAL | 3 refills | Status: AC
  Filled 2023-11-12: qty 90, 90d supply, fill #0
  Filled 2024-01-10 – 2024-02-06 (×2): qty 90, 90d supply, fill #1
  Filled 2024-05-18: qty 90, 90d supply, fill #2
  Filled 2024-08-07: qty 90, 90d supply, fill #3

## 2023-11-12 NOTE — Addendum Note (Signed)
 Addended by: Parke Poisson on: 11/12/2023 07:52 AM   Modules accepted: Orders

## 2023-11-17 ENCOUNTER — Other Ambulatory Visit: Payer: Self-pay

## 2023-12-04 ENCOUNTER — Other Ambulatory Visit: Payer: Self-pay

## 2023-12-06 ENCOUNTER — Other Ambulatory Visit: Payer: Self-pay

## 2023-12-07 ENCOUNTER — Other Ambulatory Visit: Payer: Self-pay

## 2023-12-07 MED ORDER — ALBUTEROL SULFATE HFA 108 (90 BASE) MCG/ACT IN AERS
2.0000 | INHALATION_SPRAY | Freq: Four times a day (QID) | RESPIRATORY_TRACT | 5 refills | Status: DC | PRN
Start: 2023-12-07 — End: 2024-06-29
  Filled 2023-12-07: qty 6.7, 30d supply, fill #0
  Filled 2023-12-29 – 2023-12-31 (×2): qty 6.7, 30d supply, fill #1
  Filled 2024-01-10: qty 6.7, 25d supply, fill #2
  Filled 2024-01-29: qty 6.7, 30d supply, fill #2
  Filled 2024-02-06: qty 6.7, 30d supply, fill #3

## 2023-12-29 ENCOUNTER — Other Ambulatory Visit: Payer: Self-pay

## 2023-12-31 ENCOUNTER — Other Ambulatory Visit: Payer: Self-pay

## 2024-01-07 ENCOUNTER — Other Ambulatory Visit: Payer: Self-pay

## 2024-01-07 DIAGNOSIS — R0789 Other chest pain: Secondary | ICD-10-CM | POA: Diagnosis present

## 2024-01-07 DIAGNOSIS — Z7982 Long term (current) use of aspirin: Secondary | ICD-10-CM | POA: Insufficient documentation

## 2024-01-07 DIAGNOSIS — J45909 Unspecified asthma, uncomplicated: Secondary | ICD-10-CM | POA: Insufficient documentation

## 2024-01-07 DIAGNOSIS — Z79899 Other long term (current) drug therapy: Secondary | ICD-10-CM | POA: Insufficient documentation

## 2024-01-07 DIAGNOSIS — R519 Headache, unspecified: Secondary | ICD-10-CM | POA: Insufficient documentation

## 2024-01-07 DIAGNOSIS — I10 Essential (primary) hypertension: Secondary | ICD-10-CM | POA: Insufficient documentation

## 2024-01-07 LAB — CBC
HCT: 45.1 % (ref 39.0–52.0)
Hemoglobin: 15.5 g/dL (ref 13.0–17.0)
MCH: 30.1 pg (ref 26.0–34.0)
MCHC: 34.4 g/dL (ref 30.0–36.0)
MCV: 87.6 fL (ref 80.0–100.0)
Platelets: 337 10*3/uL (ref 150–400)
RBC: 5.15 MIL/uL (ref 4.22–5.81)
RDW: 13.2 % (ref 11.5–15.5)
WBC: 7 10*3/uL (ref 4.0–10.5)
nRBC: 0 % (ref 0.0–0.2)

## 2024-01-07 LAB — LIPASE, BLOOD: Lipase: 34 U/L (ref 11–51)

## 2024-01-07 NOTE — ED Triage Notes (Signed)
 Pt to ED via POV c/o headache and right sided abd pain. Pt reports this has been going on intermittently for about a week. Pt denies N/V/D. Denies CP, SOB, fevers, dizziness

## 2024-01-08 ENCOUNTER — Other Ambulatory Visit: Payer: Self-pay

## 2024-01-08 ENCOUNTER — Emergency Department
Admission: EM | Admit: 2024-01-08 | Discharge: 2024-01-08 | Disposition: A | Discharge status: Home / Self Care | Attending: Emergency Medicine | Admitting: Emergency Medicine

## 2024-01-08 ENCOUNTER — Emergency Department

## 2024-01-08 DIAGNOSIS — R079 Chest pain, unspecified: Secondary | ICD-10-CM

## 2024-01-08 DIAGNOSIS — R519 Headache, unspecified: Secondary | ICD-10-CM

## 2024-01-08 LAB — COMPREHENSIVE METABOLIC PANEL WITH GFR
ALT: 31 U/L (ref 0–44)
AST: 30 U/L (ref 15–41)
Albumin: 3.8 g/dL (ref 3.5–5.0)
Alkaline Phosphatase: 67 U/L (ref 38–126)
Anion gap: 9 (ref 5–15)
BUN: 8 mg/dL (ref 6–20)
CO2: 25 mmol/L (ref 22–32)
Calcium: 9.1 mg/dL (ref 8.9–10.3)
Chloride: 105 mmol/L (ref 98–111)
Creatinine, Ser: 1.23 mg/dL (ref 0.61–1.24)
GFR, Estimated: >60 mL/min (ref >60)
Glucose, Bld: 148 mg/dL — ABNORMAL HIGH (ref 70–99)
Potassium: 3.7 mmol/L (ref 3.5–5.1)
Sodium: 135 mmol/L (ref 135–145)
Total Bilirubin: 0.6 mg/dL (ref 0.0–1.2)
Total Protein: 7.2 g/dL (ref 6.5–8.1)

## 2024-01-08 LAB — URINALYSIS, ROUTINE W REFLEX MICROSCOPIC
Bilirubin Urine: NEGATIVE
Glucose, UA: NEGATIVE mg/dL
Hgb urine dipstick: NEGATIVE
Ketones, ur: NEGATIVE mg/dL
Leukocytes,Ua: NEGATIVE
Nitrite: NEGATIVE
Protein, ur: NEGATIVE mg/dL
Specific Gravity, Urine: 1.015 (ref 1.005–1.030)
pH: 6 (ref 5.0–8.0)

## 2024-01-08 LAB — TROPONIN I (HIGH SENSITIVITY): Troponin I (High Sensitivity): 5 ng/L (ref <18)

## 2024-01-08 MED ORDER — METHOCARBAMOL 500 MG PO TABS
500.0000 mg | ORAL_TABLET | Freq: Three times a day (TID) | ORAL | 0 refills | Status: DC | PRN
  Filled 2024-01-08: qty 15, 5d supply, fill #0

## 2024-01-08 MED ORDER — KETOROLAC TROMETHAMINE 30 MG/ML IJ SOLN
60.0000 mg | Freq: Once | INTRAMUSCULAR | Status: AC
  Administered 2024-01-08: 60 mg via INTRAMUSCULAR
  Filled 2024-01-08: qty 2

## 2024-01-08 NOTE — Discharge Instructions (Addendum)
You may alternate Tylenol 1000 mg every 6 hours as needed for pain, fever and Ibuprofen 800 mg every 6-8 hours as needed for pain, fever.  Please take Ibuprofen with food.  Do not take more than 4000 mg of Tylenol (acetaminophen) in a 24 hour period. ° °

## 2024-01-08 NOTE — ED Provider Notes (Signed)
 [facility] Provider Note    Event Date/Time   First MD Initiated Contact with Patient 01/08/24 0007     (approximate)   History   Headache and Abdominal Pain   HPI  Spencer Tanner is a 48 y.o. male with history of hypertension, atrial fibrillation, asthma who presents to the emergency department with complaints of generalized headache and right-sided chest pain.  States headache was worse today after working all day.  He did take medication at home and has improved.  Denies any head injury.  No numbness, tingling or focal weakness.  No fevers, neck pain or neck stiffness.  Also reports for the past few days feeling right-sided chest discomfort worse when he twists and turns.  He does work 3 jobs and does have to lift heavy things intermittently.  Denies any known injury.  No difficulty breathing, fevers or cough.  No history of PE, DVT, exogenous estrogen use, recent fractures, surgery, trauma, hospitalization, prolonged travel or other immobilization. No lower extremity swelling or pain. No calf tenderness.  No tenderness in the abdomen.  No vomiting or diarrhea.  No urinary symptoms.   History provided by patient.    Past Medical History:  Diagnosis Date   A-fib (HCC)    Asthma    Hypertension     History reviewed. No pertinent surgical history.  MEDICATIONS:  Prior to Admission medications   Medication Sig Start Date End Date Taking? Authorizing Provider  albuterol  (VENTOLIN  HFA) 108 (90 Base) MCG/ACT inhaler INHALE 2 PUFFS BY MOUTH EVERY 6 HOURS AS NEEDED FOR WHEEZING 01/30/22     albuterol  (VENTOLIN  HFA) 108 (90 Base) MCG/ACT inhaler Inhale 2 puffs into the lungs every 6 (six) hours as needed. 12/07/23     aspirin  EC 81 MG tablet Take 81 mg by mouth daily. Swallow whole.    [provider]  budesonide -formoterol  (SYMBICORT ) 160-4.5 MCG/ACT inhaler Inhale 2 puffs into the lungs 2 (two) times daily. 08/21/23     clindamycin  (CLEOCIN )  300 MG capsule Take 1 capsule (300 mg total) by mouth 3 (three) times daily for 10 days 09/17/23     diltiazem  (CARDIZEM  CD) 180 MG 24 hr capsule Take 1 capsule (180 mg total) by mouth daily. 06/02/23     gabapentin  (NEURONTIN ) 100 MG capsule Take 1 capsule (100 mg total) by mouth 2 (two) times daily. 06/02/23     losartan  (COZAAR ) 25 MG tablet Take 1 tablet (25 mg total) by mouth daily. 11/12/23 02/10/24  Furth, Cadence H, PA-C  pantoprazole  (PROTONIX ) 40 MG tablet Take 1 tablet (40 mg total) by mouth daily. 01/27/23     omeprazole  (PRILOSEC  OTC) 20 MG tablet Take 1 tablet (20 mg total) by mouth daily. 01/06/22 01/06/22  Constancia Delton, MD    Physical Exam   Triage Vital Signs: ED Triage Vitals  Encounter Vitals Group     BP 01/07/24 2022 134/83     Systolic BP Percentile --      Diastolic BP Percentile --      Pulse Rate 01/07/24 2022 74     Resp 01/07/24 2022 18     Temp 01/07/24 2022 98.6 F (37 C)     Temp src --      SpO2 01/07/24 2022 98 %     Weight 01/07/24 2018 201 lb (91.2 kg)     Height 01/07/24 2018 6' (1.829 m)     Head Circumference --      Peak Flow --  Pain Score 01/07/24 2018 7     Pain Loc --      Pain Education --      Exclude from Growth Chart --     Most recent vital signs: Vitals:   01/07/24 2022  BP: 134/83  Pulse: 74  Resp: 18  Temp: 98.6 F (37 C)  SpO2: 98%    CONSTITUTIONAL: Alert, responds appropriately to questions. Well-appearing; well-nourished HEAD: Normocephalic, atraumatic EYES: Conjunctivae clear, pupils appear equal, sclera nonicteric ENT: normal nose; moist mucous membranes NECK: Supple, normal ROM, no meningismus CARD: RRR; S1 and S2 appreciated CHEST:  Chest wall is nontender to palpation.  No crepitus, ecchymosis, erythema, warmth, rash or other lesions present.   RESP: Normal chest excursion without splinting or tachypnea; breath sounds clear and equal bilaterally; no wheezes, no rhonchi, no rales, no hypoxia or respiratory  distress, speaking full sentences ABD/GI: Non-distended; soft, non-tender, no rebound, no guarding, no peritoneal signs, no tenderness in the right upper quadrant, negative Murphy sign BACK: The back appears normal, no midline spinal tenderness or step-off or deformity EXT: Normal ROM in all joints; no deformity noted, no edema, no calf tenderness or calf swelling SKIN: Normal color for age and race; warm; no rash on exposed skin NEURO: Moves all extremities equally, normal speech, normal sensation diffusely, no facial asymmetry PSYCH: The patient's mood and manner are appropriate.   ED Results / Procedures / Treatments   LABS: (all labs ordered are listed, but only abnormal results are displayed) Labs Reviewed  COMPREHENSIVE METABOLIC PANEL WITH GFR - Abnormal; Notable for the following components:      Result Value   Glucose, Bld 148 (*)    All other components within normal limits  URINALYSIS, ROUTINE W REFLEX MICROSCOPIC - Abnormal; Notable for the following components:   Color, Urine YELLOW (*)    APPearance CLEAR (*)    All other components within normal limits  LIPASE, BLOOD  CBC  TROPONIN I (HIGH SENSITIVITY)     EKG:   Patient refused EKG.  RADIOLOGY: My personal review and interpretation of imaging: Chest x-ray clear.  I have personally reviewed all radiology reports.   DG Ribs Unilateral W/Chest Right Result Date: 01/08/2024 CLINICAL DATA:  Right-sided chest pain, initial encounter EXAM: RIGHT RIBS AND CHEST - 3+ VIEW COMPARISON:  None Available. FINDINGS: Cardiac shadows within normal limits. Lungs are well aerated bilaterally. No focal infiltrate or effusion is seen. No pneumothorax is noted. No acute rib fracture is noted. IMPRESSION: No acute rib abnormality noted. Electronically Signed   By: Violeta Grey M.D.   On: 01/08/2024 00:55     PROCEDURES:  Critical Care performed: No    Procedures    IMPRESSION / MDM / ASSESSMENT AND PLAN / ED COURSE  I  reviewed the triage vital signs and the nursing notes.    Patient here with complaints of intermittent headaches, right-sided chest pain worse with twisting and turning.     DIFFERENTIAL DIAGNOSIS (includes but not limited to):   Tension headache, migraine, doubt intracranial hemorrhage, stroke, cavernous sinus thrombosis, meningitis  Suspect chest pain is musculoskeletal in nature, muscle spasm, costochondritis.  Low suspicion clinically for ACS, PE, pneumonia, dissection.  Also doubt cholelithiasis, cholecystitis, pancreatitis, kidney stone, UTI, pyelonephritis.   Patient's presentation is most consistent with acute complicated illness / injury requiring diagnostic workup.   PLAN: Labs initiated from triage.  Normal LFTs, lipase.  Normal hemoglobin.  Will add on troponin although chest pain atypical for ACS.  Will obtain x-rays of the right ribs, lungs.  He is PERC negative.  Doubt PE.  Will also obtain urinalysis to rule out UTI/pyelonephritis as the cause for his right-sided chest pain.  He has no rash or other lesions noted to the chest wall.  Will give Toradol  for pain control.  Neurologically intact here.  No meningismus.  No indication for head imaging.   MEDICATIONS GIVEN IN ED: Medications  ketorolac  (TORADOL ) 30 MG/ML injection 60 mg (60 mg Intramuscular Given 01/08/24 0038)     ED COURSE: Patient reports feeling better.  Troponin negative.  Patient refused EKG.  Chest x-ray reviewed and interpreted by myself and the radiologist and is unremarkable.  Ribs appear normal.  Urine shows no blood or sign of infection.  Again suspect musculoskeletal chest wall pain, tension headache.  Recommended Tylenol , ibuprofen  as needed and will discharge with Robaxin  to take.  Provided with work note.  Patient has PCP for follow-up.   At this time, I do not feel there is any life-threatening condition present. I reviewed all nursing notes, vitals, pertinent previous records.  All lab and urine  results, EKGs, imaging ordered have been independently reviewed and interpreted by myself.  I reviewed all available radiology reports from any imaging ordered this visit.  Based on my assessment, I feel the patient is safe to be discharged home without further emergent workup and can continue workup as an outpatient as needed. Discussed all findings, treatment plan as well as usual and customary return precautions.  They verbalize understanding and are comfortable with this plan.  Outpatient follow-up has been provided as needed.  All questions have been answered.    CONSULTS:  none   OUTSIDE RECORDS REVIEWED: Reviewed last cardiology note on 11/10/2023.  Cardiac CTA in 2022 showed coronary calcium score of 0.  CHA2DS2-VASc score of 1.  On aspirin  and diltiazem  for A-fib.     FINAL CLINICAL IMPRESSION(S) / ED DIAGNOSES   Final diagnoses:  Right-sided chest pain  Intermittent headache     Rx / DC Orders   ED Discharge Orders          Ordered    methocarbamol  (ROBAXIN ) 500 MG tablet  Every 8 hours PRN        01/08/24 0234             Note:  This document was prepared using Dragon voice recognition software and may include unintentional dictation errors.   Truth Barot, Clover Dao, DO 01/08/24 [phone]

## 2024-01-08 NOTE — ED Notes (Signed)
 Patient refused ED EKG. He stated, "Its not necessary. I don't think its my heart".

## 2024-01-10 ENCOUNTER — Other Ambulatory Visit: Payer: Self-pay

## 2024-01-12 ENCOUNTER — Other Ambulatory Visit: Payer: Self-pay

## 2024-01-12 MED ORDER — DILTIAZEM HCL ER COATED BEADS 180 MG PO CP24
180.0000 mg | ORAL_CAPSULE | Freq: Every day | ORAL | 0 refills | Status: DC
  Filled 2024-01-12: qty 30, 30d supply, fill #0

## 2024-01-13 ENCOUNTER — Other Ambulatory Visit: Payer: Self-pay

## 2024-01-29 ENCOUNTER — Other Ambulatory Visit: Payer: Self-pay

## 2024-01-29 MED ORDER — BUDESONIDE-FORMOTEROL FUMARATE 160-4.5 MCG/ACT IN AERO
2.0000 | INHALATION_SPRAY | Freq: Two times a day (BID) | RESPIRATORY_TRACT | 0 refills | Status: DC
  Filled 2024-01-29 – 2024-02-06 (×2): qty 10.2, 30d supply, fill #0

## 2024-02-03 ENCOUNTER — Other Ambulatory Visit: Payer: Self-pay

## 2024-02-06 ENCOUNTER — Other Ambulatory Visit: Payer: Self-pay

## 2024-02-07 ENCOUNTER — Other Ambulatory Visit: Payer: Self-pay

## 2024-02-08 ENCOUNTER — Other Ambulatory Visit: Payer: Self-pay

## 2024-02-08 MED ORDER — PANTOPRAZOLE SODIUM 40 MG PO TBEC
40.0000 mg | DELAYED_RELEASE_TABLET | Freq: Every day | ORAL | 0 refills | Status: AC
  Filled 2024-02-08 – 2024-08-29 (×2): qty 90, 90d supply, fill #0

## 2024-02-08 MED ORDER — ALBUTEROL SULFATE HFA 108 (90 BASE) MCG/ACT IN AERS
INHALATION_SPRAY | Freq: Four times a day (QID) | RESPIRATORY_TRACT | 0 refills | Status: DC | PRN
  Filled 2024-03-25: qty 6.7, 28d supply, fill #0

## 2024-02-08 MED ORDER — ALBUTEROL SULFATE HFA 108 (90 BASE) MCG/ACT IN AERS
2.0000 | INHALATION_SPRAY | Freq: Four times a day (QID) | RESPIRATORY_TRACT | 0 refills | Status: DC | PRN
  Filled 2024-02-08 – 2024-02-23 (×2): qty 6.7, 30d supply, fill #0

## 2024-02-08 MED ORDER — DILTIAZEM HCL ER COATED BEADS 180 MG PO CP24
180.0000 mg | ORAL_CAPSULE | Freq: Every day | ORAL | 0 refills | Status: DC
  Filled 2024-02-08: qty 90, 90d supply, fill #0

## 2024-02-09 ENCOUNTER — Other Ambulatory Visit: Payer: Self-pay

## 2024-02-23 ENCOUNTER — Other Ambulatory Visit: Payer: Self-pay

## 2024-02-28 ENCOUNTER — Other Ambulatory Visit: Payer: Self-pay

## 2024-02-29 ENCOUNTER — Other Ambulatory Visit: Payer: Self-pay

## 2024-03-04 ENCOUNTER — Other Ambulatory Visit: Payer: Self-pay

## 2024-03-07 ENCOUNTER — Other Ambulatory Visit: Payer: Self-pay

## 2024-03-07 MED ORDER — BUDESONIDE-FORMOTEROL FUMARATE 160-4.5 MCG/ACT IN AERO
2.0000 | INHALATION_SPRAY | Freq: Two times a day (BID) | RESPIRATORY_TRACT | 0 refills | Status: DC
  Filled 2024-03-07: qty 10.2, 30d supply, fill #0

## 2024-03-25 ENCOUNTER — Other Ambulatory Visit: Payer: Self-pay

## 2024-04-10 ENCOUNTER — Other Ambulatory Visit: Payer: Self-pay

## 2024-04-11 ENCOUNTER — Other Ambulatory Visit: Payer: Self-pay

## 2024-04-12 ENCOUNTER — Other Ambulatory Visit: Payer: Self-pay

## 2024-04-12 MED ORDER — BUDESONIDE-FORMOTEROL FUMARATE 160-4.5 MCG/ACT IN AERO
2.0000 | INHALATION_SPRAY | Freq: Two times a day (BID) | RESPIRATORY_TRACT | 0 refills | Status: DC
  Filled 2024-04-12: qty 10.2, 30d supply, fill #0

## 2024-04-13 ENCOUNTER — Other Ambulatory Visit: Payer: Self-pay

## 2024-04-20 ENCOUNTER — Other Ambulatory Visit: Payer: Self-pay

## 2024-04-20 ENCOUNTER — Other Ambulatory Visit: Payer: Self-pay | Admitting: Internal Medicine

## 2024-04-20 ENCOUNTER — Ambulatory Visit
Admission: RE | Admit: 2024-04-20 | Discharge: 2024-04-20 | Disposition: A | Discharge status: Home / Self Care | Source: Ambulatory Visit | Attending: Internal Medicine | Admitting: Internal Medicine

## 2024-04-20 DIAGNOSIS — K219 Gastro-esophageal reflux disease without esophagitis: Secondary | ICD-10-CM | POA: Diagnosis not present

## 2024-04-20 DIAGNOSIS — R7303 Prediabetes: Secondary | ICD-10-CM | POA: Diagnosis not present

## 2024-04-20 DIAGNOSIS — E538 Deficiency of other specified B group vitamins: Secondary | ICD-10-CM | POA: Diagnosis not present

## 2024-04-20 DIAGNOSIS — M79605 Pain in left leg: Secondary | ICD-10-CM | POA: Diagnosis not present

## 2024-04-20 DIAGNOSIS — K0381 Cracked tooth: Secondary | ICD-10-CM | POA: Diagnosis not present

## 2024-04-20 DIAGNOSIS — J4540 Moderate persistent asthma, uncomplicated: Secondary | ICD-10-CM | POA: Diagnosis not present

## 2024-04-20 DIAGNOSIS — R519 Headache, unspecified: Secondary | ICD-10-CM | POA: Diagnosis not present

## 2024-04-20 DIAGNOSIS — Z125 Encounter for screening for malignant neoplasm of prostate: Secondary | ICD-10-CM | POA: Diagnosis not present

## 2024-04-20 DIAGNOSIS — I8393 Asymptomatic varicose veins of bilateral lower extremities: Secondary | ICD-10-CM | POA: Diagnosis not present

## 2024-04-20 DIAGNOSIS — Z6831 Body mass index (BMI) 31.0-31.9, adult: Secondary | ICD-10-CM | POA: Diagnosis not present

## 2024-04-20 MED ORDER — ALBUTEROL SULFATE HFA 108 (90 BASE) MCG/ACT IN AERS
2.0000 | INHALATION_SPRAY | Freq: Four times a day (QID) | RESPIRATORY_TRACT | 5 refills | Status: AC | PRN
  Filled 2024-04-20: qty 6.7, 30d supply, fill #0
  Filled 2024-05-12 – 2024-05-17 (×2): qty 6.7, 30d supply, fill #1
  Filled 2024-06-13: qty 6.7, 25d supply, fill #2
  Filled 2024-06-28: qty 6.7, 30d supply, fill #3
  Filled 2024-07-04: qty 6.7, 25d supply, fill #3
  Filled 2024-08-02 – 2024-08-03 (×2): qty 6.7, 25d supply, fill #4
  Filled 2024-09-03: qty 6.7, 25d supply, fill #5

## 2024-04-20 MED ORDER — CLINDAMYCIN HCL 300 MG PO CAPS
300.0000 mg | ORAL_CAPSULE | Freq: Three times a day (TID) | ORAL | 0 refills | Status: AC
  Filled 2024-04-20: qty 30, 10d supply, fill #0

## 2024-04-21 ENCOUNTER — Ambulatory Visit
Admission: RE | Admit: 2024-04-21 | Discharge: 2024-04-21 | Disposition: A | Discharge status: Home / Self Care | Source: Ambulatory Visit | Attending: Internal Medicine | Admitting: Internal Medicine

## 2024-04-21 DIAGNOSIS — M79605 Pain in left leg: Secondary | ICD-10-CM | POA: Insufficient documentation

## 2024-04-26 ENCOUNTER — Other Ambulatory Visit: Payer: Self-pay

## 2024-04-26 MED ORDER — VITAMIN D (ERGOCALCIFEROL) 1.25 MG (50000 UNIT) PO CAPS
50000.0000 [IU] | ORAL_CAPSULE | ORAL | 1 refills | Status: AC
  Filled 2024-04-26: qty 13, 90d supply, fill #0
  Filled 2024-08-16: qty 13, 90d supply, fill #1

## 2024-04-26 NOTE — Progress Notes (Signed)
 Chief Complaint  Patient presents with  . FMLA forms    HPI  Spencer Tanner is a 49 y.o. M here for a Follow up Hx of PAF- On Diltiazem . Compression stocking has helped with left leg pain and swelling  Venous doppler was negative for DVT  Has quit smoking completely aprox 4 yrs back  Has gained approx 19 lbs in weight  No alcohol . Sleeps -ok. No symptoms of Depression  Sometimes gets anxious  Single- has 1 son age 27 Works at [employer] Recent labs: Hgb; 14.6 , Sugar;106 ,Se Creat: 1.5, (EGFR: 57)  A1c; 6.1, TSH; 1.673 Total Cholesterol; 185 and Triglycerides: 153. PSA: 0.67 B12; 173 Vit D ; 17.2    ROS Rest of 10 point review of systems is normal.  Outpatient Encounter Medications as of 04/26/2024  Medication Sig Dispense Refill  . albuterol  MDI, PROVENTIL , VENTOLIN , PROAIR , HFA 90 mcg/actuation inhaler Inhale 2 inhalations into the lungs every 6 (six) hours as needed 18 g 5  . budesonide -formoteroL  (SYMBICORT ) 160-4.5 mcg/actuation inhaler Inhale 2 puffs into the lungs 2 (two) times daily. 10.2 g 0  . clindamycin  (CLEOCIN ) 300 MG capsule Take 1 capsule (300 mg total) by mouth 3 (three) times daily for 10 days 30 capsule 0  . dilTIAZem  (CARDIZEM  CD) 180 MG CD capsule Take 180 mg by mouth once daily    . dilTIAZem  (CARDIZEM  CD) 180 MG CD capsule Take 1 capsule (180 mg total) by mouth daily. 90 capsule 0  . gabapentin  (NEURONTIN ) 100 MG capsule Take 1 capsule (100 mg total) by mouth 2 (two) times daily 180 capsule 1  . losartan  (COZAAR ) 25 MG tablet Take 1 tablet by mouth once daily    . pantoprazole  (PROTONIX ) 40 MG DR tablet Take 1 tablet (40 mg total) by mouth daily. 90 tablet 0  . losartan  (COZAAR ) 25 MG tablet Take 1 tablet (25 mg total) by mouth once daily for 180 days 90 tablet 1   No facility-administered encounter medications on file as of 04/26/2024.    Allergies as of 04/26/2024 - Reviewed 10/06/2023  Allergen Reaction Noted  . Augmentin  [amoxicillin -pot clavulanate] Vomiting 11/25/2017  . Penicillins Nausea 01/08/2022    Past Medical History:  Diagnosis Date  . Asthma without status asthmaticus ([facility]HS-HCC)     No past surgical history on file.  Vitals:   04/26/24 1514  BP: 128/82  Pulse: 78   Body mass index is 31.3 kg/m.  Wt Readings from Last 3 Encounters:  04/26/24 (!) 104.7 kg (230 lb 12.8 oz)  04/20/24 (!) 106.2 kg (234 lb 3.2 oz)  10/06/23 98.2 kg (216 lb 9.6 oz)   Office Visit on 04/20/2024  Component Date Value Ref Range Status  . WBC (White Blood Cell Count) 04/20/2024 8.3  4.1 - 10.2 10^3/uL Final  . RBC (Red Blood Cell Count) 04/20/2024 4.93  4.69 - 6.13 10^6/uL Final  . Hemoglobin 04/20/2024 14.6  14.1 - 18.1 gm/dL Final  . Hematocrit 90/96/7974 43.3  40.0 - 52.0 % Final  . MCV (Mean Corpuscular Volume) 04/20/2024 87.8  80.0 - 100.0 fl Final  . MCH (Mean Corpuscular Hemoglobin) 04/20/2024 29.6  27.0 - 31.2 pg Final  . MCHC (Mean Corpuscular Hemoglobin * 04/20/2024 33.7  32.0 - 36.0 gm/dL Final  . Platelet Count 04/20/2024 307  150 - 450 10^3/uL Final  . RDW-CV (Red Cell Distribution Widt* 04/20/2024 13.4  11.6 - 14.8 % Final  . MPV (Mean Platelet Volume) 04/20/2024 8.6 (L)  9.4 - 12.4 fl Final  . Neutrophils 04/20/2024 5.02  1.50 - 7.80 10^3/uL Final  . Lymphocytes 04/20/2024 2.45  1.00 - 3.60 10^3/uL Final  . Monocytes 04/20/2024 0.59  0.00 - 1.50 10^3/uL Final  . Eosinophils 04/20/2024 0.19  0.00 - 0.55 10^3/uL Final  . Basophils 04/20/2024 0.05  0.00 - 0.09 10^3/uL Final  . Neutrophil % 04/20/2024 60.4  32.0 - 70.0 % Final  . Lymphocyte % 04/20/2024 29.5  10.0 - 50.0 % Final  . Monocyte % 04/20/2024 7.1  4.0 - 13.0 % Final  . Eosinophil % 04/20/2024 2.3  1.0 - 5.0 % Final  . Basophil% 04/20/2024 0.6  0.0 - 2.0 % Final  . Immature Granulocyte % 04/20/2024 0.1  <=0.7 % Final  . Immature Granulocyte Count 04/20/2024 0.01  <=0.06 10^3/L Final  . Glucose 04/20/2024 106  70 - 110 mg/dL Final   . Sodium 90/96/7974 143  136 - 145 mmol/L Final  . Potassium 04/20/2024 4.5  3.6 - 5.1 mmol/L Final  . Chloride 04/20/2024 105  97 - 109 mmol/L Final  . Carbon Dioxide (CO2) 04/20/2024 28.8  22.0 - 32.0 mmol/L Final  . Urea Nitrogen (BUN) 04/20/2024 14  7 - 25 mg/dL Final  . Creatinine 90/96/7974 1.5 (H)  0.7 - 1.3 mg/dL Final  . Glomerular Filtration Rate (eGFR) 04/20/2024 57 (L)  >60 mL/min/1.73sq m Final   CKD-EPI (2021) does not include patient's race in the calculation of eGFR.  Monitoring changes of plasma creatinine and eGFR over time is useful for monitoring kidney function.   Interpretive Ranges for eGFR (CKD-EPI 2021):  eGFR:       >60 mL/min/1.73 sq. m - Normal eGFR:       30-59 mL/min/1.73 sq. m - Moderately Decreased eGFR:       15-29 mL/min/1.73 sq. m  - Severely Decreased eGFR:       < 15 mL/min/1.73 sq. m  - Kidney Failure    Note: These eGFR calculations do not apply in acute situations when eGFR is changing rapidly or patients on dialysis.  . Calcium 04/20/2024 9.6  8.7 - 10.3 mg/dL Final  . AST  90/96/7974 31  8 - 39 U/L Final  . ALT  04/20/2024 32  6 - 57 U/L Final  . Alk Phos (alkaline Phosphatase) 04/20/2024 91  34 - 104 U/L Final  . Albumin 04/20/2024 4.2  3.5 - 4.8 g/dL Final  . Bilirubin, Total 04/20/2024 0.3  0.3 - 1.2 mg/dL Final  . Protein, Total 04/20/2024 7.0  6.1 - 7.9 g/dL Final  . A/G Ratio 90/96/7974 1.5  1.0 - 5.0 gm/dL Final  . Hemoglobin J8R 04/20/2024 6.1 (H)  4.2 - 5.6 % Final  . Average Blood Glucose (Calc) 04/20/2024 128  mg/dL Final  . Cholesterol, Total 04/20/2024 185  100 - 200 mg/dL Final  . Triglyceride 90/96/7974 153  35 - 199 mg/dL Final  . HDL (High Density Lipoprotein) Cho* 04/20/2024 53.1  29.0 - 71.0 mg/dL Final  . LDL Calculated 04/20/2024 898  0 - 130 mg/dL Final  . VLDL Cholesterol 04/20/2024 31  mg/dL Final  . Cholesterol/HDL Ratio 04/20/2024 3.5   Final  . Creatinine, Random Urine 04/20/2024 132.1  40.0 - 300.0 mg/dL Final   . Urine Albumin, Random 04/20/2024 <7    mg/L Final  . Urine Albumin/Creatinine Ratio 04/20/2024 <5.3  <30.0 ug/mg Final   Urine:         Spot collection              (  g/mg creatinine)     Normal               < 30   Moderately          30-299         increased   Clinical             >=300 albuminuria  . PSA (Prostate Specific Antigen), T* 04/20/2024 0.67  0.10 - 4.00 ng/mL Final  . Thyroid  Stimulating Hormone (TSH) 04/20/2024 1.673  0.450-5.330 uIU/ml uIU/mL Final  . Vitamin B12 04/20/2024 173 (L)  >300 pg/mL Final  . Ferritin 04/20/2024 32  23 - 336 ng/mL Final  . Vitamin D , 25-Hydroxy - [facility] 04/20/2024 17.2 (L)  30.0 - 100.0 ng/mL Final   Vitamin D  deficiency has been defined by the Institute of Medicine and an Endocrine Society practice guideline as a level of serum 25-OH vitamin D  less than 20 ng/mL (1,2). The Endocrine Society went on to further define vitamin D  insufficiency as a level between 21 and 29 ng/mL (2). 1. IOM (Institute of Medicine). 2010. Dietary reference    intakes for calcium and D. [place]: The    [employer]. 2. Holick MF, Binkley Hamlin, Bischoff-Ferrari HA, et al.    Evaluation, treatment, and prevention of vitamin D     deficiency: an Endocrine Society clinical practice    guideline. JCEM. 2011 Jul; 96(7):1911-30.  SABRA Hep C Virus Ab - [facility] 04/20/2024 Non Reactive  Non Reactive Final   HCV antibody alone does not differentiate between previously resolved infection and active infection. Equivocal and Reactive HCV antibody results should be followed up with an HCV RNA test to support the diagnosis of active HCV infection.  . Magnesium  04/20/2024 1.9  1.8 - 2.5 mg/dL Final   Exam    Blood pressure 128/82, pulse 78, height 182.9 cm (6'), weight (!) 104.7 kg (230 lb 12.8 oz), SpO2 98%.  Wt Readings from Last 3 Encounters:  04/26/24 (!) 104.7 kg (230 lb 12.8 oz)  04/20/24 (!) 106.2 kg (234 lb 3.2 oz)  10/06/23 98.2 kg (216 lb  9.6 oz)   Body mass index is 31.3 kg/m.  Anxious  General. Alert oriented x3  Skin. Normal  Eyes. Sclera and conjunctiva clear; pupils equal round and reactive to light: extraocular movements intact Ears. External normal; canals clear; tympanic membranes normal Oropharynx:Left upper molar is missing Nose. Mucosa healthy without drainage or ulceration Neck. No swelling, masses, stiffness, pain, limited movement, carotid pulses normal bilaterally, thyroid  normal size, no masses palpated.  No bruits Lungs. Respirations unlabored CTA No use of accessory muscles of respiration  Back. No spinal deformity Cardiovascular. Heart regular rate and rhythm without murmurs, gallops, or rubs Abdomen. Soft; non tender; non distended; normoactive bowel sounds; no masses or organomegaly RECTAL: Declined  Lymph Nodes. No significant cervical, supraclavicular, axillary or inguinal lymphadenopathy noted Musculoskeletal.Left leg  mild swelling noted  Varicose veins noted  Neurologic. Alert and oriented; speech intact; face symmetrical; moves all extremities well   Assessment and Plan   1 Pain and swelling Left ozh:Inpwh better  Compression stockinsg is helping  2 Gingivitis and fracture Rt lower pre molar ; Sees Dentist  Has completed Clindamycin   3 Palpitations/ HTN/ Anxiety; States Losartan  has helped  4 Bitemporal headaches;On Gabapentin  100 mg po twice a day  5 EJQ:Rlmmzwuob in SR  On Cardizem   6 Low B12 and Vit D ; Inj B12 today and q weekly x 3 weeks and q monthly x  4 months  Rec Vit D 50,000 units q weekly  7 Asthma:Stable- On Ventolin  and Symbicort  Inhaler 160/4.5 2 puffs twice a day 8 Elevated A1c (6.1): Discussed low carb diet  9 GERD: On  Omeprazole  20 mg bid  10 Renal insufficiency; Increase fluid intake  Avoid Nephrotoxic agents - Monitor  11 Health maintenance: Up to date with Flu shot ,Pneumovax and COVID vaccine and Inj Tdap  FMLA form filled out  Labs prior to next visit   Follow up in 4 months     Tamra Leventhal  MD

## 2024-04-27 ENCOUNTER — Other Ambulatory Visit: Payer: Self-pay

## 2024-05-10 ENCOUNTER — Other Ambulatory Visit: Payer: Self-pay

## 2024-05-12 ENCOUNTER — Other Ambulatory Visit: Payer: Self-pay

## 2024-05-12 MED ORDER — BD DISP NEEDLE 25G X 1" MISC
0 refills | Status: AC
  Filled 2024-05-12: qty 7, 7d supply, fill #0

## 2024-05-12 MED ORDER — CYANOCOBALAMIN 1000 MCG/ML IJ SOLN: INTRAMUSCULAR | 0 refills | Status: AC

## 2024-05-12 MED ORDER — CYANOCOBALAMIN 1000 MCG/ML IJ SOLN
1000.0000 ug | INTRAMUSCULAR | 0 refills | Status: DC
  Filled 2024-05-12: qty 3, 21d supply, fill #0

## 2024-05-13 ENCOUNTER — Other Ambulatory Visit: Payer: Self-pay

## 2024-05-13 MED ORDER — BUDESONIDE-FORMOTEROL FUMARATE 160-4.5 MCG/ACT IN AERO
2.0000 | INHALATION_SPRAY | Freq: Two times a day (BID) | RESPIRATORY_TRACT | 0 refills | Status: DC
  Filled 2024-05-13: qty 10.2, 30d supply, fill #0

## 2024-05-18 ENCOUNTER — Other Ambulatory Visit: Payer: Self-pay

## 2024-05-18 MED ORDER — DILTIAZEM HCL ER COATED BEADS 180 MG PO CP24
180.0000 mg | ORAL_CAPSULE | Freq: Every day | ORAL | 0 refills | Status: DC
  Filled 2024-05-18: qty 90, 90d supply, fill #0

## 2024-05-31 ENCOUNTER — Other Ambulatory Visit: Payer: Self-pay

## 2024-05-31 ENCOUNTER — Emergency Department

## 2024-05-31 ENCOUNTER — Emergency Department
Admission: EM | Admit: 2024-05-31 | Discharge: 2024-05-31 | Disposition: A | Discharge status: Home / Self Care | Attending: Emergency Medicine | Admitting: Emergency Medicine

## 2024-05-31 DIAGNOSIS — S4991XA Unspecified injury of right shoulder and upper arm, initial encounter: Secondary | ICD-10-CM | POA: Insufficient documentation

## 2024-05-31 DIAGNOSIS — M19011 Primary osteoarthritis, right shoulder: Secondary | ICD-10-CM | POA: Diagnosis not present

## 2024-05-31 DIAGNOSIS — M79621 Pain in right upper arm: Secondary | ICD-10-CM | POA: Diagnosis not present

## 2024-05-31 DIAGNOSIS — M25511 Pain in right shoulder: Secondary | ICD-10-CM | POA: Diagnosis not present

## 2024-05-31 DIAGNOSIS — W172XXA Fall into hole, initial encounter: Secondary | ICD-10-CM | POA: Diagnosis not present

## 2024-05-31 NOTE — ED Triage Notes (Signed)
 Pt reports he tripped in a hole yesterday and fell landing on his right arm. Pt c/o pain to right shoulder and upper arm

## 2024-05-31 NOTE — ED Provider Notes (Signed)
 [facility] Provider Note    Event Date/Time   First MD Initiated Contact with Patient 05/31/24 [phone]     (approximate)   History   Shoulder Pain   HPI  Spencer Tanner is a 48 y.o. male   Past medical history of atrial fibrillation not on anticoagulation, here with shoulder pain after a fall yesterday.  He stepped in a hole fell onto his right outstretched arm and had right shoulder pain.  No head strike no loss of consciousness no other injuries.  He has no wrist pain or elbow pain.  Just pain in the right shoulder.  No other acute medical complaints.   External Medical Documents Reviewed: Prior outpatient notes      Physical Exam   Triage Vital Signs: ED Triage Vitals [05/31/24 0604]  Encounter Vitals Group     BP (!) 158/98     Girls Systolic BP Percentile      Girls Diastolic BP Percentile      Boys Systolic BP Percentile      Boys Diastolic BP Percentile      Pulse Rate 72     Resp 17     Temp 98.2 F (36.8 C)     Temp Source Oral     SpO2 97 %     Weight 201 lb (91.2 kg)     Height 6' 1 (1.854 m)     Head Circumference      Peak Flow      Pain Score 10     Pain Loc      Pain Education      Exclude from Growth Chart     Most recent vital signs: Vitals:   05/31/24 0604  BP: (!) 158/98  Pulse: 72  Resp: 17  Temp: 98.2 F (36.8 C)  SpO2: 97%    General: Awake, no distress.  CV:  Good peripheral perfusion.  Resp:  Normal effort.  Abd:  No distention.  Other:  No point tenderness to palpation of the shoulder, elbow, wrist or hand.  He is able to range very gingerly at all joints of the affected right upper extremity.  When ranging at the right shoulder he winces in pain.  No chest wall tenderness.  Breathing comfortably.  Neurovascular intact.   ED Results / Procedures / Treatments   Labs (all labs ordered are listed, but only abnormal results are displayed) Labs Reviewed - No data to  display    RADIOLOGY I independently reviewed and interpreted right shoulder x-ray I see no obvious fractures or dislocation I also reviewed radiologist's formal read.   PROCEDURES:  Critical Care performed: No  Procedures   MEDICATIONS ORDERED IN ED: Medications - No data to display   IMPRESSION / MDM / ASSESSMENT AND PLAN / ED COURSE  I reviewed the triage vital signs and the nursing notes.                                Patient's presentation is most consistent with acute presentation with potential threat to life or bodily function.  Differential diagnosis includes, but is not limited to, shoulder fracture, dislocation, tendon or ligamentous injury, considered but less likely elbow wrist or hand injury or other injuries to the body from his fall   MDM:   Mechanical slip and fall leading to a FOOSH to the right upper extremity and right shoulder pain.  Fortunately no fractures  or dislocations on x-ray.  I suspect ligamentous injury.  Will give sling for comfort but strongly advised that he begin ranging as soon as possible.  Anticipatory guidance given and he will follow-up with orthopedics      FINAL CLINICAL IMPRESSION(S) / ED DIAGNOSES   Final diagnoses:  Right shoulder injury, initial encounter     Rx / DC Orders   ED Discharge Orders     None        Note:  This document was prepared using Dragon voice recognition software and may include unintentional dictation errors.    Cyrena Mylar, MD 05/31/24 [phone]

## 2024-05-31 NOTE — Discharge Instructions (Signed)
 There were no broken bones or dislocations on your x-ray.  Use the sling for comfort only for the first day or two for severe pain.  After that you should start to move the shoulder is much as you can to begin rehab.  Keeping the shoulder in the sling for too long will make the healing process more difficult.  Take acetaminophen  650 mg and ibuprofen  400 mg every 6 hours for pain.  Take with food.  Use the pain lotion that your doctor had previously prescribed and applied to the shoulder.  If over the next 2 to 3 days you do not start to feel improvement, you can call the bone and joint specialist at the number provided for an appointment.  Thank you for choosing us  for your health care today!  Please see your primary doctor this week for a follow up appointment.   If you have any new, worsening, or unexpected symptoms call your doctor right away or come back to the emergency department for reevaluation.  It was my pleasure to care for you today.   Ginnie EDISON Cyrena, MD

## 2024-05-31 NOTE — ED Notes (Signed)
 Patient given discharge instructions including importance of follow up appt as needed with stated understanding. R. Shoulder sling sized and applied to patient. Patient stable and ambulatory with steady even gait on dispo.

## 2024-06-02 ENCOUNTER — Other Ambulatory Visit: Payer: Self-pay

## 2024-06-02 ENCOUNTER — Emergency Department
Admission: EM | Admit: 2024-06-02 | Discharge: 2024-06-02 | Disposition: A | Discharge status: Home / Self Care | Attending: Emergency Medicine | Admitting: Emergency Medicine

## 2024-06-02 DIAGNOSIS — S4991XS Unspecified injury of right shoulder and upper arm, sequela: Secondary | ICD-10-CM | POA: Diagnosis not present

## 2024-06-02 DIAGNOSIS — I10 Essential (primary) hypertension: Secondary | ICD-10-CM | POA: Diagnosis not present

## 2024-06-02 DIAGNOSIS — X58XXXS Exposure to other specified factors, sequela: Secondary | ICD-10-CM | POA: Diagnosis not present

## 2024-06-02 DIAGNOSIS — J45909 Unspecified asthma, uncomplicated: Secondary | ICD-10-CM | POA: Diagnosis not present

## 2024-06-02 DIAGNOSIS — S4991XD Unspecified injury of right shoulder and upper arm, subsequent encounter: Secondary | ICD-10-CM | POA: Diagnosis not present

## 2024-06-02 MED ORDER — CYCLOBENZAPRINE HCL 5 MG PO TABS
5.0000 mg | ORAL_TABLET | Freq: Three times a day (TID) | ORAL | 0 refills | Status: DC | PRN
  Filled 2024-06-02: qty 30, 10d supply, fill #0

## 2024-06-02 MED ORDER — MELOXICAM 15 MG PO TABS
15.0000 mg | ORAL_TABLET | Freq: Every day | ORAL | 0 refills | Status: DC
  Filled 2024-06-02: qty 30, 30d supply, fill #0

## 2024-06-02 NOTE — ED Provider Notes (Signed)
 [facility] Emergency Department Provider Note     Event Date/Time   First MD Initiated Contact with Patient 06/02/24 1012     (approximate)   History   Arm Pain   HPI  Spencer Tanner is a 48 y.o. male with a past medical history of asthma, HTN and A-fib presents to the ED with complaint of continued pain in his right shoulder.  Patient was evaluated in this ED 2 days ago for a FOOSH injury.  X-rays negative of acute bony abnormalities.  Patient was advised to take OTC medication for pain and follow-up with orthopedics.  Patient reports he has been trying ice, ibuprofen  and Tylenol  with no relief.  No new injuries.     Physical Exam   Triage Vital Signs: ED Triage Vitals  Encounter Vitals Group     BP 06/02/24 0958 (!) 146/92     Girls Systolic BP Percentile --      Girls Diastolic BP Percentile --      Boys Systolic BP Percentile --      Boys Diastolic BP Percentile --      Pulse Rate 06/02/24 0958 66     Resp 06/02/24 0958 16     Temp 06/02/24 0958 98.2 F (36.8 C)     Temp Source 06/02/24 0958 Oral     SpO2 06/02/24 0958 99 %     Weight 06/02/24 0957 200 lb 13.4 oz (91.1 kg)     Height --      Head Circumference --      Peak Flow --      Pain Score 06/02/24 0957 10     Pain Loc --      Pain Education --      Exclude from Growth Chart --     Most recent vital signs: Vitals:   06/02/24 0958  BP: (!) 146/92  Pulse: 66  Resp: 16  Temp: 98.2 F (36.8 C)  SpO2: 99%    General Awake, no distress.  HEENT NCAT.  CV:  Good peripheral perfusion.  RESP:  Normal effort.  ABD:  No distention.  Other:  Patient presents in sling.  Right shoulder reveals no visible deformities.  No tenderness to palpation over bony prominences.  Limited range of motion secondary to pain.  Neurovascular status intact all throughout.   ED Results / Procedures / Treatments   Labs (all labs ordered are listed, but only abnormal results are  displayed) Labs Reviewed - No data to display  No results found.  PROCEDURES:  Critical Care performed: No  Procedures   MEDICATIONS ORDERED IN ED: Medications - No data to display   IMPRESSION / MDM / ASSESSMENT AND PLAN / ED COURSE  I reviewed the triage vital signs and the nursing notes.                               48 y.o. male presents to the emergency department for evaluation and treatment of right shoulder pain. See HPI for further details.   Differential diagnosis includes, but is not limited to tendon or ligamentous injury, contusion  Patient's presentation is most consistent with acute, uncomplicated illness.  Chart reviewed -negative right shoulder and right humerus x-rays on ED visit 06/02/24.   Patient is alert and oriented.  He is hemodynamic stable.  Physical exam findings are as stated above.  No indication for further workup with new images as  there is no new injury.  Pain is ineffective with OTC medication.  Will prescribe meloxicam  and Flexeril .  Education on RICE therapy and alternation of pain medications provided.  Advised shoulder range of motion exercises as tolerated and to call orthopedic for further evaluation.  Patient verbalized understanding and is in agreement with this care plan.  He is in stable condition for discharge home.  ED return precaution discussed.  FINAL CLINICAL IMPRESSION(S) / ED DIAGNOSES   Final diagnoses:  Right shoulder injury, sequela     Rx / DC Orders   ED Discharge Orders          Ordered    meloxicam  (MOBIC ) 15 MG tablet  Daily        06/02/24 1051    cyclobenzaprine  (FLEXERIL ) 5 MG tablet  3 times daily PRN        06/02/24 1051             Note:  This document was prepared using Dragon voice recognition software and may include unintentional dictation errors.    Margrette, Riko Lumsden A, PA-C 06/02/24 1059    Levander Slate, MD 06/02/24 [phone]

## 2024-06-02 NOTE — Discharge Instructions (Addendum)
 Pain control:  Meloxicam  (prescription medication) - You can take 1 (5mg  tablet) three times daily as needed for pain Acetaminophen  (tylenol ) - You can take 2 extra strength tablets (1000 mg) every 6 hours as needed for pain/fever.  You can alternate these medications or take them together.  Make sure you eat food/drink water when taking these medications.  Call and schedule an appointment with orthopedics for further evaluation of right shoulder injury.  You can call Dr. Gust with [facility] or [facility]

## 2024-06-02 NOTE — ED Triage Notes (Signed)
 C/o worsening pain to right arm.  Seen 05/31/2024 for same. States pain not improving. Has been taking OTC meds with no relief.

## 2024-06-13 ENCOUNTER — Other Ambulatory Visit: Payer: Self-pay

## 2024-06-13 MED ORDER — BUDESONIDE-FORMOTEROL FUMARATE 160-4.5 MCG/ACT IN AERO
2.0000 | INHALATION_SPRAY | Freq: Two times a day (BID) | RESPIRATORY_TRACT | 0 refills | Status: DC
  Filled 2024-06-13: qty 10.2, 30d supply, fill #0

## 2024-06-17 ENCOUNTER — Other Ambulatory Visit: Payer: Self-pay

## 2024-06-17 DIAGNOSIS — W010XXA Fall on same level from slipping, tripping and stumbling without subsequent striking against object, initial encounter: Secondary | ICD-10-CM | POA: Diagnosis not present

## 2024-06-17 DIAGNOSIS — M25511 Pain in right shoulder: Secondary | ICD-10-CM | POA: Diagnosis not present

## 2024-06-17 DIAGNOSIS — R29898 Other symptoms and signs involving the musculoskeletal system: Secondary | ICD-10-CM | POA: Diagnosis not present

## 2024-06-17 MED ORDER — HYDROCODONE-ACETAMINOPHEN 5-325 MG PO TABS
1.0000 | ORAL_TABLET | Freq: Three times a day (TID) | ORAL | 0 refills | Status: DC | PRN
  Filled 2024-06-17: qty 30, 10d supply, fill #0

## 2024-06-17 NOTE — Progress Notes (Signed)
 Chief Complaint: Chief Complaint  Patient presents with  . Shoulder Pain    Right shoulder pain after a fall 05/31/24    History of Present Illness Spencer Tanner is a 48 year old male who presents with right shoulder pain and tingling following a fall.  He experiences persistent tingling in his right shoulder extending to his hand since a fall on May 31, 2024. The tingling affects the entire hand and causes difficulty sleeping on the affected side. He is unable to lift his arm over his head and experiences significant discomfort when attempting to do so.  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 He has been taking meloxicam  and Flexeril  without relief.  There is no neck pain, and the numbness does not worsen with certain movements.      Past Medical History: Past Medical History:  Diagnosis Date  . Asthma without status asthmaticus (HHS-HCC)     Past Surgical History: History reviewed. No pertinent surgical history.  Past Family History: Family History  Problem Relation Age of Onset  . No Known Problems Mother   . No Known Problems Father     Medications: Current Outpatient Medications  Medication Sig Dispense Refill  . albuterol  MDI, PROVENTIL , VENTOLIN , PROAIR , HFA 90 mcg/actuation inhaler Inhale 2 inhalations into the lungs every 6 (six) hours as needed 18 g 5  . budesonide -formoteroL  (SYMBICORT ) 160-4.5 mcg/actuation inhaler Inhale 2 puffs into the lungs 2 (two) times daily. 10.2 g 0  . cyanocobalamin  (VITAMIN B12) 1,000 mcg/mL injection Inject 1 mL (1,000 mcg total) into the muscle once a week 3 mL 0  . cyanocobalamin  (VITAMIN B12) 1,000 mcg/mL injection Inject 1 mL (1,000 mcg total) into the muscle monthly 4 mL 0  . dilTIAZem  (CARDIZEM  CD) 180 MG CD capsule Take 180 mg by mouth once daily    . dilTIAZem  (CARDIZEM  CD) 180 MG CD capsule Take 1 capsule (180 mg total) by mouth  daily. 90 capsule 0  . ergocalciferol , vitamin D2, 1,250 mcg (50,000 unit) capsule Take 1 capsule (50,000 Units total) by mouth once a week for 30 days 13 capsule 1  . gabapentin  (NEURONTIN ) 100 MG capsule Take 1 capsule (100 mg total) by mouth 2 (two) times daily 180 capsule 1  . HYDROcodone -acetaminophen  (NORCO) 5-325 mg tablet Take 1 tablet by mouth every 8 (eight) hours as needed for Pain 30 tablet 0  . losartan  (COZAAR ) 25 MG tablet Take 1 tablet (25 mg total) by mouth once daily for 180 days 90 tablet 1  . losartan  (COZAAR ) 25 MG tablet Take 1 tablet by mouth once daily    . pantoprazole  (PROTONIX ) 40 MG DR tablet Take 1 tablet (40 mg total) by mouth daily. 90 tablet 0   No current facility-administered medications for this visit.    Allergies: Allergies  Allergen Reactions  . Augmentin [Amoxicillin -Pot Clavulanate] Vomiting  . Penicillins Nausea     Review of Systems:  A comprehensive 14 point ROS was performed, reviewed by me today, and the pertinent orthopaedic findings are documented in the HPI.   Exam: BP 136/82   Ht 182.9 cm (6')   Wt (!) 105.4 kg (232 lb 6.4 oz)   BMI 31.52 kg/m  General/Constitutional: The patient appears to be well-nourished, well-developed, and in no acute distress. Neuro/Psych: Normal mood and affect, oriented to person, place and time. Eyes: Non-icteric.  Pupils are equal, round, and reactive to light, and  exhibit synchronous movement. ENT: Unremarkable. Lymphatic: No palpable adenopathy. Respiratory: Non-labored breathing Cardiovascular: No edema, swelling or tenderness, except as noted in detailed exam. Integumentary: No impressive skin lesions present, except as noted in detailed exam. Musculoskeletal: Unremarkable, except as noted in detailed exam.  General: Well developed, well nourished 48 y.o. male in no apparent distress.  Normal affect.  Normal communication.  Patient answers questions appropriately.  The patient has a normal gait.   There is no antalgic component.   Right Upper Extremity: Examination of the right shoulder and arm showed no bony abnormality or edema.  The patient has decreased active and normal passive motion with abduction, flexion.  Patient has normal internal and external rotation.  The patient has pain with shoulder motion, specifically abduction and flexion greater than 70. The patient has a positive Hawkins test and a positive Impingement test.  The patient has a positive drop arm test. The patient has a negative yergasons and speeds test.  The patient is tender along the deltoid muscle.  There is severe subacromial space tenderness with no AC joint tenderness.  The patient has no instability of the shoulder with anterior-posterior motion.  There is a negative sulcus sign.  The rotator cuff muscle strength is 3/5 with supraspinatus, 5/5 with internal rotation, and 5/5 with external rotation.  There is no crepitus with range of motion activities.    Neurological: The patient has sensation that is intact to light touch and pinprick bilaterally.  The patient has normal grip strength.  The patient has full biceps, wrist extension, grip, and interosseous strength.  The patient has 2 + DTRs bilaterally.  Vascular: The patient has less than 2 second capillary refill.  The patient has normal ulnar and radial pulses.  The patient has normal warmth to touch.       CLINICAL DATA:  Tripped and fell.  Pain.   EXAM:  RIGHT SHOULDER - 2+ VIEW   COMPARISON:  None Available.   FINDINGS:  No evidence for an acute fracture. No shoulder separation or  dislocation. Mild degenerative changes are noted in the  acromioclavicular joint.   IMPRESSION:  No acute bony findings.    Electronically Signed    By: Camellia Candle M.D.    On: 05/31/2024 06:38     CLINICAL DATA:  Tripped and fell yesterday landing on his right arm.  Pain.   EXAM:  RIGHT HUMERUS - 2+ VIEW   COMPARISON:  None Available.   FINDINGS:   There is no evidence of fracture or other focal bone lesions. Soft  tissues are unremarkable.   IMPRESSION:  Negative.    Electronically Signed    By: Camellia Candle M.D.    On: 05/31/2024 06:39     Impression: Acute pain of right shoulder [M25.511] Acute pain of right shoulder  (primary encounter diagnosis) Shoulder weakness Fall, initial encounter  Plan:  48 year old male with fall 2 weeks ago.  He injured his right shoulder and fell directly on it and has been unable to abduct or maintain any form of abduction since.  He has been on Flexeril , meloxicam  and been in the sling.  He still continues to have severe pain and has a positive drop arm test.  His x-rays are negative for fracture.  Will order MRI of the right shoulder without contrast to evaluate for rotator cuff tear since he has a positive drop arm test and weakness from a traumatic fall.  Call after MRI discuss results.  He is given  Norco to take at bedtime and will discontinue meloxicam  due to suboptimal kidney function  This note was generated in part with voice recognition software and I apologize for any typographical errors that were not detected and corrected.   Debby Lonni Amber MPA-C

## 2024-06-20 ENCOUNTER — Ambulatory Visit
Admission: RE | Admit: 2024-06-20 | Discharge: 2024-06-20 | Disposition: A | Discharge status: Home / Self Care | Source: Ambulatory Visit | Attending: Orthopedic Surgery | Admitting: Orthopedic Surgery

## 2024-06-20 ENCOUNTER — Other Ambulatory Visit: Payer: Self-pay | Admitting: Orthopedic Surgery

## 2024-06-20 DIAGNOSIS — M75121 Complete rotator cuff tear or rupture of right shoulder, not specified as traumatic: Secondary | ICD-10-CM | POA: Insufficient documentation

## 2024-06-20 DIAGNOSIS — R29898 Other symptoms and signs involving the musculoskeletal system: Secondary | ICD-10-CM

## 2024-06-20 DIAGNOSIS — M19011 Primary osteoarthritis, right shoulder: Secondary | ICD-10-CM | POA: Diagnosis not present

## 2024-06-20 DIAGNOSIS — M25511 Pain in right shoulder: Secondary | ICD-10-CM

## 2024-06-20 DIAGNOSIS — W19XXXA Unspecified fall, initial encounter: Secondary | ICD-10-CM | POA: Insufficient documentation

## 2024-06-23 DIAGNOSIS — S46011A Strain of muscle(s) and tendon(s) of the rotator cuff of right shoulder, initial encounter: Secondary | ICD-10-CM | POA: Diagnosis not present

## 2024-06-24 ENCOUNTER — Other Ambulatory Visit: Payer: Self-pay | Admitting: Orthopedic Surgery

## 2024-06-28 ENCOUNTER — Other Ambulatory Visit: Payer: Self-pay

## 2024-06-29 ENCOUNTER — Encounter: Payer: Self-pay | Admitting: Orthopedic Surgery

## 2024-06-30 ENCOUNTER — Encounter: Payer: Self-pay | Admitting: Orthopedic Surgery

## 2024-06-30 NOTE — Anesthesia Preprocedure Evaluation (Addendum)
 Anesthesia Evaluation  Patient identified by MRN, date of birth, ID band Patient awake    Reviewed: Allergy & Precautions, H&P , NPO status , Patient's Chart, lab work & pertinent test results  Airway Mallampati: III  TM Distance: >3 FB Neck ROM: Full    Dental no notable dental hx. (+) Poor Dentition Left upper molar missing:   Pulmonary asthma , former smoker   Pulmonary exam normal breath sounds clear to auscultation       Cardiovascular hypertension, Normal cardiovascular exam+ dysrhythmias  Rhythm:Regular Rate:Normal  06-18-21 11-Patch Wear Time:  10 days and 1 hours (2022-10-14T09:22:15-0400 to 2022-10-24T10:29:51-0400)   Patient had a min HR of 37 bpm, max HR of 139 bpm, and avg HR of 75 bpm. Predominant underlying rhythm was Sinus Rhythm. Second Degree AV Block-Mobitz I (Wenckebach) was present. Junctional Rhythm was present. Isolated SVEs were rare (<1.0%), SVE  Couplets were rare (<1.0%), and SVE Triplets were rare (<1.0%). Isolated VEs were rare (<1.0%, 58), VE Couplets were rare (<1.0%, 2), and VE Triplets were rare (<1.0%, 1).  Benign cardiac monitor, no significant arrhythmias, no evidence of atrial fibrillation or atrial flutter.  07-08-21 echo 1. Left ventricular ejection fraction, by estimation, is 60 to 65%. The  left ventricle has normal function. The left ventricle has no regional  wall motion abnormalities. Left ventricular diastolic parameters were  normal. The average left ventricular  global longitudinal strain is -17.6 %. The global longitudinal strain is  normal.   2. Right ventricular systolic function is normal. The right ventricular  size is normal.   3. The mitral valve is normal in structure. No evidence of mitral valve  regurgitation.   4. The aortic valve is tricuspid. Aortic valve regurgitation is not  visualized.   5. The inferior vena cava is normal in size with greater than 50%  respiratory  variability, suggesting right atrial pressure of 3 mmHg.   Cardiac CTA 05/2021 IMPRESSION: 1. Coronary calcium score of 0. Patient is low risk for coronary events.   2. Normal coronary origin with right dominance.   3. No evidence of CAD.   4. CAD-RADS 0. No evidence of CAD (0%). Consider non-atherosclerotic causes of chest pain.      Neuro/Psych negative neurological ROS  negative psych ROS   GI/Hepatic negative GI ROS, Neg liver ROS,GERD  ,,  Endo/Other  negative endocrine ROS    Renal/GU negative Renal ROS  negative genitourinary   Musculoskeletal negative musculoskeletal ROS (+)    Abdominal   Peds negative pediatric ROS (+)  Hematology negative hematology ROS (+)   Anesthesia Other Findings Asthma  Hypertension A-fib (HCC)  Dysrhythmia Pre-diabetes  GERD (gastroesophageal reflux disease) Mobitz type 1 second degree atrioventricular block  Paroxysmal atrial fibrillation  Former smoker     Reproductive/Obstetrics negative OB ROS                              Anesthesia Physical Anesthesia Plan  ASA: 3  Anesthesia Plan: General ETT   Post-op Pain Management:    Induction: Intravenous  PONV Risk Score and Plan:   Airway Management Planned: Oral ETT  Additional Equipment:   Intra-op Plan:   Post-operative Plan: Extubation in OR  Informed Consent: I have reviewed the patients History and Physical, chart, labs and discussed the procedure including the risks, benefits and alternatives for the proposed anesthesia with the patient or authorized representative who has indicated his/her understanding and acceptance.  Dental Advisory Given  Plan Discussed with: Anesthesiologist, CRNA and Surgeon  Anesthesia Plan Comments: (Patient consented for risks of anesthesia including but not limited to:  - adverse reactions to medications - damage to eyes, teeth, lips or other oral mucosa - nerve damage due to positioning   - sore throat or hoarseness - Damage to heart, brain, nerves, lungs, other parts of body or loss of life  Patient voiced understanding and assent.)         Anesthesia Quick Evaluation

## 2024-07-01 ENCOUNTER — Ambulatory Visit: Payer: Self-pay | Admitting: Anesthesiology

## 2024-07-01 ENCOUNTER — Ambulatory Visit
Admission: RE | Admit: 2024-07-01 | Discharge: 2024-07-01 | Disposition: A | Discharge status: Home / Self Care | Attending: Orthopedic Surgery | Admitting: Orthopedic Surgery

## 2024-07-01 ENCOUNTER — Encounter
Admission: RE | Disposition: A | Discharge status: Home / Self Care | Payer: Self-pay | Source: Home / Self Care | Attending: Orthopedic Surgery

## 2024-07-01 ENCOUNTER — Other Ambulatory Visit: Payer: Self-pay

## 2024-07-01 ENCOUNTER — Encounter: Payer: Self-pay | Admitting: Orthopedic Surgery

## 2024-07-01 DIAGNOSIS — M7581 Other shoulder lesions, right shoulder: Secondary | ICD-10-CM | POA: Diagnosis not present

## 2024-07-01 DIAGNOSIS — Z79899 Other long term (current) drug therapy: Secondary | ICD-10-CM | POA: Insufficient documentation

## 2024-07-01 DIAGNOSIS — I480 Paroxysmal atrial fibrillation: Secondary | ICD-10-CM | POA: Diagnosis not present

## 2024-07-01 DIAGNOSIS — Z7951 Long term (current) use of inhaled steroids: Secondary | ICD-10-CM | POA: Diagnosis not present

## 2024-07-01 DIAGNOSIS — W19XXXA Unspecified fall, initial encounter: Secondary | ICD-10-CM | POA: Insufficient documentation

## 2024-07-01 DIAGNOSIS — K219 Gastro-esophageal reflux disease without esophagitis: Secondary | ICD-10-CM | POA: Insufficient documentation

## 2024-07-01 DIAGNOSIS — W1839XA Other fall on same level, initial encounter: Secondary | ICD-10-CM | POA: Diagnosis not present

## 2024-07-01 DIAGNOSIS — I10 Essential (primary) hypertension: Secondary | ICD-10-CM | POA: Diagnosis not present

## 2024-07-01 DIAGNOSIS — M7541 Impingement syndrome of right shoulder: Secondary | ICD-10-CM | POA: Diagnosis not present

## 2024-07-01 DIAGNOSIS — S46011A Strain of muscle(s) and tendon(s) of the rotator cuff of right shoulder, initial encounter: Secondary | ICD-10-CM | POA: Diagnosis not present

## 2024-07-01 DIAGNOSIS — Z87891 Personal history of nicotine dependence: Secondary | ICD-10-CM | POA: Diagnosis not present

## 2024-07-01 DIAGNOSIS — M7521 Bicipital tendinitis, right shoulder: Secondary | ICD-10-CM | POA: Diagnosis not present

## 2024-07-01 DIAGNOSIS — M25811 Other specified joint disorders, right shoulder: Secondary | ICD-10-CM | POA: Insufficient documentation

## 2024-07-01 DIAGNOSIS — J45909 Unspecified asthma, uncomplicated: Secondary | ICD-10-CM | POA: Diagnosis not present

## 2024-07-01 HISTORY — DX: Paroxysmal atrial fibrillation: I48.0

## 2024-07-01 HISTORY — DX: Cardiac arrhythmia, unspecified: I49.9

## 2024-07-01 HISTORY — DX: Atrioventricular block, second degree: I44.1

## 2024-07-01 HISTORY — DX: Gastro-esophageal reflux disease without esophagitis: K21.9

## 2024-07-01 HISTORY — PX: BICEPT TENODESIS: SHX5116

## 2024-07-01 HISTORY — PX: SHOULDER ARTHROSCOPY WITH OPEN ROTATOR CUFF REPAIR: SHX6092

## 2024-07-01 HISTORY — DX: Prediabetes: R73.03

## 2024-07-01 HISTORY — PX: SUBACROMIAL DECOMPRESSION: SHX5174

## 2024-07-01 SURGERY — ARTHROSCOPY, SHOULDER WITH REPAIR, ROTATOR CUFF, OPEN
Anesthesia: General | Site: Shoulder | Laterality: Right

## 2024-07-01 MED ORDER — FLUMAZENIL 0.5 MG/5ML IV SOLN
0.2000 mg | Freq: Once | INTRAVENOUS | Status: AC
  Administered 2024-07-01: 0.2 mg via INTRAVENOUS

## 2024-07-01 MED ORDER — SODIUM CHLORIDE 0.9 % IV SOLN: INTRAVENOUS | Status: DC | PRN

## 2024-07-01 MED ORDER — MIDAZOLAM HCL (PF) 2 MG/2ML IJ SOLN
INTRAMUSCULAR | Status: DC | PRN
  Administered 2024-07-01: 2 mg via INTRAVENOUS

## 2024-07-01 MED ORDER — MIDAZOLAM HCL 2 MG/2ML IJ SOLN
INTRAMUSCULAR | Status: AC
  Filled 2024-07-01: qty 2

## 2024-07-01 MED ORDER — OXYCODONE HCL 5 MG PO TABS: 10.0000 mg | ORAL_TABLET | Freq: Once | ORAL | Status: DC

## 2024-07-01 MED ORDER — BUPIVACAINE HCL 0.25 % IJ SOLN
INTRAMUSCULAR | Status: AC
  Filled 2024-07-01: qty 1

## 2024-07-01 MED ORDER — ASPIRIN 325 MG PO TBEC
325.0000 mg | DELAYED_RELEASE_TABLET | Freq: Every day | ORAL | 0 refills | Status: AC
  Filled 2024-07-01: qty 14, 14d supply, fill #0

## 2024-07-01 MED ORDER — OXYCODONE HCL 5 MG PO TABS: 5.0000 mg | ORAL_TABLET | ORAL | 0 refills | Status: AC | PRN

## 2024-07-01 MED ORDER — ROCURONIUM BROMIDE 10 MG/ML (PF) SYRINGE
PREFILLED_SYRINGE | INTRAVENOUS | Status: AC
  Filled 2024-07-01: qty 10

## 2024-07-01 MED ORDER — LIDOCAINE HCL (PF) 2 % IJ SOLN
INTRAMUSCULAR | Status: AC
  Filled 2024-07-01: qty 5

## 2024-07-01 MED ORDER — LACTATED RINGERS IV SOLN: INTRAVENOUS | Status: DC

## 2024-07-01 MED ORDER — FENTANYL CITRATE (PF) 100 MCG/2ML IJ SOLN
INTRAMUSCULAR | Status: DC | PRN
  Administered 2024-07-01 (×2): 50 ug via INTRAVENOUS

## 2024-07-01 MED ORDER — ONDANSETRON HCL 4 MG/2ML IJ SOLN
INTRAMUSCULAR | Status: DC | PRN
  Administered 2024-07-01: 4 mg via INTRAVENOUS

## 2024-07-01 MED ORDER — PHENYLEPHRINE 80 MCG/ML (10ML) SYRINGE FOR IV PUSH (FOR BLOOD PRESSURE SUPPORT)
PREFILLED_SYRINGE | INTRAVENOUS | Status: DC | PRN
  Administered 2024-07-01 (×2): 160 ug via INTRAVENOUS

## 2024-07-01 MED ORDER — SUGAMMADEX SODIUM 200 MG/2ML IV SOLN
INTRAVENOUS | Status: DC | PRN
  Administered 2024-07-01: 200 mg via INTRAVENOUS

## 2024-07-01 MED ORDER — FLUMAZENIL NICU IV SYRINGE 0.1 MG/ML: 0.2000 mg | INTRAVENOUS | Status: DC | PRN

## 2024-07-01 MED ORDER — MIDAZOLAM HCL (PF) 2 MG/2ML IJ SOLN
2.0000 mg | Freq: Once | INTRAMUSCULAR | Status: AC
  Administered 2024-07-01: 2 mg via INTRAVENOUS

## 2024-07-01 MED ORDER — BUPIVACAINE LIPOSOME 1.3 % IJ SUSP
INTRAMUSCULAR | Status: AC
  Filled 2024-07-01: qty 10

## 2024-07-01 MED ORDER — PROPOFOL 10 MG/ML IV BOLUS
INTRAVENOUS | Status: DC | PRN
Start: 2024-07-01 — End: 2024-07-01
  Administered 2024-07-01: 200 mg via INTRAVENOUS

## 2024-07-01 MED ORDER — ACETAMINOPHEN EXTRA STRENGTH 500 MG PO TABS
1000.0000 mg | ORAL_TABLET | Freq: Three times a day (TID) | ORAL | 2 refills | Status: AC
  Filled 2024-07-01: qty 90, 15d supply, fill #0

## 2024-07-01 MED ORDER — FLUMAZENIL 0.5 MG/5ML IV SOLN
INTRAVENOUS | Status: AC
  Filled 2024-07-01: qty 5

## 2024-07-01 MED ORDER — CEFAZOLIN SODIUM-DEXTROSE 2-4 GM/100ML-% IV SOLN
2.0000 g | INTRAVENOUS | Status: AC
  Administered 2024-07-01: 2 g via INTRAVENOUS

## 2024-07-01 MED ORDER — ONDANSETRON 4 MG PO TBDP
4.0000 mg | ORAL_TABLET | Freq: Three times a day (TID) | ORAL | 0 refills | Status: AC | PRN
  Filled 2024-07-01: qty 20, 7d supply, fill #0

## 2024-07-01 MED ORDER — LIDOCAINE HCL 2 % IJ SOLN
INTRAMUSCULAR | Status: AC
  Filled 2024-07-01: qty 2

## 2024-07-01 MED ORDER — DEXAMETHASONE SOD PHOSPHATE PF 10 MG/ML IJ SOLN
INTRAMUSCULAR | Status: DC | PRN
  Administered 2024-07-01: 4 mg via INTRAVENOUS

## 2024-07-01 MED ORDER — LACTATED RINGERS IV SOLN
INTRAVENOUS | Status: DC | PRN
  Administered 2024-07-01: 4 mL

## 2024-07-01 MED ORDER — CEFAZOLIN SODIUM-DEXTROSE 2-3 GM-%(50ML) IV SOLR
INTRAVENOUS | Status: AC
Start: 2024-07-01 — End: 2024-07-01
  Filled 2024-07-01: qty 50

## 2024-07-01 MED ORDER — ONDANSETRON HCL 4 MG/2ML IJ SOLN
INTRAMUSCULAR | Status: AC
  Filled 2024-07-01: qty 2

## 2024-07-01 MED ORDER — ACETAMINOPHEN 500 MG PO TABS: 1000.0000 mg | ORAL_TABLET | Freq: Three times a day (TID) | ORAL | 2 refills | Status: AC

## 2024-07-01 MED ORDER — ROCURONIUM BROMIDE 100 MG/10ML IV SOLN
INTRAVENOUS | Status: DC | PRN
  Administered 2024-07-01: 60 mg via INTRAVENOUS

## 2024-07-01 MED ORDER — OXYCODONE HCL 5 MG PO TABS
5.0000 mg | ORAL_TABLET | ORAL | 0 refills | Status: DC | PRN
  Filled 2024-07-01: qty 30, 3d supply, fill #0

## 2024-07-01 MED ORDER — FENTANYL CITRATE (PF) 100 MCG/2ML IJ SOLN: 100.0000 ug | Freq: Once | INTRAMUSCULAR | Status: DC

## 2024-07-01 MED ORDER — FENTANYL CITRATE (PF) 100 MCG/2ML IJ SOLN
INTRAMUSCULAR | Status: AC
  Filled 2024-07-01: qty 2

## 2024-07-01 MED ORDER — DEXAMETHASONE SODIUM PHOSPHATE 4 MG/ML IJ SOLN
INTRAMUSCULAR | Status: AC
  Filled 2024-07-01: qty 1

## 2024-07-01 MED ORDER — ONDANSETRON 4 MG PO TBDP: 4.0000 mg | ORAL_TABLET | Freq: Three times a day (TID) | ORAL | 0 refills | Status: AC | PRN

## 2024-07-01 MED ORDER — LIDOCAINE HCL (CARDIAC) PF 100 MG/5ML IV SOSY
PREFILLED_SYRINGE | INTRAVENOUS | Status: DC | PRN
  Administered 2024-07-01: 100 mg via INTRAVENOUS

## 2024-07-01 MED ORDER — LACTATED RINGERS IR SOLN
Status: DC | PRN
  Administered 2024-07-01: 12000 mL

## 2024-07-01 MED ORDER — ASPIRIN 325 MG PO TBEC: 325.0000 mg | DELAYED_RELEASE_TABLET | Freq: Every day | ORAL | 0 refills | Status: AC

## 2024-07-01 SURGICAL SUPPLY — 48 items
ANCHOR 2.3 SP SGL 1.2 XBRAID (Anchor) IMPLANT
ANCHOR SWIVELOCK SP KL 4.75 (Anchor) IMPLANT
BLADE SHAVER 4.5X7 STR FR (MISCELLANEOUS) ×1 IMPLANT
BUR BR 5.5 WIDE MOUTH (BURR) ×1 IMPLANT
CANNULA PART THRD DISP 5.75X7 (CANNULA) ×1 IMPLANT
CANNULA PARTIAL THREAD 2X7 (CANNULA) ×1 IMPLANT
CANNULA TWIST IN 8.25X7CM (CANNULA) IMPLANT
CHLORAPREP W/TINT 26 (MISCELLANEOUS) ×1 IMPLANT
COOLER ICEMAN CLASSIC (MISCELLANEOUS) ×1 IMPLANT
COVER LIGHT HANDLE UNIVERSAL (MISCELLANEOUS) ×3 IMPLANT
CRADLE LAMINECT ARM (MISCELLANEOUS) ×1 IMPLANT
DERMABOND ADVANCED .7 DNX12 (GAUZE/BANDAGES/DRESSINGS) ×1 IMPLANT
DRAPE STERI 35X30 U-POUCH (DRAPES) ×1 IMPLANT
DRAPE U-SHAPE 48X52 POLY STRL (PACKS) ×1 IMPLANT
DRSG TEGADERM 4X4.75 (GAUZE/BANDAGES/DRESSINGS) ×3 IMPLANT
ELECTRODE REM PT RTRN 9FT ADLT (ELECTROSURGICAL) ×1 IMPLANT
GAUZE SPONGE 4X4 12PLY STRL (GAUZE/BANDAGES/DRESSINGS) ×1 IMPLANT
GAUZE XEROFORM 1X8 LF (GAUZE/BANDAGES/DRESSINGS) ×1 IMPLANT
GLOVE BIOGEL PI IND STRL 8 (GLOVE) ×2 IMPLANT
GLOVE SURG SS PI 7.5 STRL IVOR (GLOVE) ×3 IMPLANT
GLOVE SURG SYN 8.0 PF PI (GLOVE) ×1 IMPLANT
GOWN STRL REIN 2XL XLG LVL4 (GOWN DISPOSABLE) ×1 IMPLANT
GOWN STRL REUS W/ TWL LRG LVL3 (GOWN DISPOSABLE) ×3 IMPLANT
IV LR IRRIG 3000ML ARTHROMATIC (IV SOLUTION) ×4 IMPLANT
KIT STABILIZATION SHOULDER (MISCELLANEOUS) ×1 IMPLANT
KIT TURNOVER KIT A (KITS) ×1 IMPLANT
MANIFOLD NEPTUNE II (INSTRUMENTS) ×1 IMPLANT
MASK FACE SPIDER DISP (MASK) ×1 IMPLANT
MAT ABSORB FLUID 56X50 GRAY (MISCELLANEOUS) ×2 IMPLANT
PACK ARTHROSCOPY SHOULDER (MISCELLANEOUS) ×1 IMPLANT
PAD ABD DERMACEA PRESS 5X9 (GAUZE/BANDAGES/DRESSINGS) ×2 IMPLANT
PAD WRAPON POLAR SHDR XLG (MISCELLANEOUS) ×1 IMPLANT
PASSER SUT FIRSTPASS SELF (INSTRUMENTS) IMPLANT
SET Y ADAPTER MULIT-BAG IRRIG (MISCELLANEOUS) ×2 IMPLANT
SPONGE T-LAP 18X18 ~~LOC~~+RFID (SPONGE) IMPLANT
SUT ETHILON 3-0 (SUTURE) ×1 IMPLANT
SUT PROLENE 0 CT 2 (SUTURE) ×1 IMPLANT
SUT VIC AB 0 CT1 36 (SUTURE) ×1 IMPLANT
SUT VIC AB 2-0 CT2 27 (SUTURE) ×1 IMPLANT
SUTURE EHLN 3-0 FS-10 30 BLK (SUTURE) IMPLANT
SUTURE MNCRL 4-0 27XMF (SUTURE) ×1 IMPLANT
SUTURETAPE 1.3 40 W/NDL BLUE (SUTURE) IMPLANT
SYSTEM IMPL TENODESIS LNT 2.9 (Orthopedic Implant) IMPLANT
TUBE SET DOUBLEFLO INFLOW (TUBING) ×1 IMPLANT
TUBING CONNECTING 10 (TUBING) ×1 IMPLANT
TUBING OUTFLOW SET DBLFO PUMP (TUBING) ×1 IMPLANT
TUBING SUCTION CONN 0.25 STRL (TUBING) IMPLANT
WAND WEREWOLF FLOW 90D (MISCELLANEOUS) ×1 IMPLANT

## 2024-07-01 NOTE — H&P (Signed)
 Paper H&P to be scanned into permanent record. H&P reviewed. No significant changes noted.

## 2024-07-01 NOTE — Transfer of Care (Signed)
 Immediate Anesthesia Transfer of Care Note  Patient: Spencer Tanner  Procedure(s) Performed: ARTHROSCOPY, SHOULDER WITH REPAIR, ROTATOR CUFF, OPEN (Right: Shoulder) DECOMPRESSION, SUBACROMIAL SPACE (Right: Shoulder) TENODESIS, BICEPS (Right: Shoulder)  Patient Location: PACU  Anesthesia Type:General  Level of Consciousness: sedated  Airway & Oxygen Therapy: Patient Spontanous Breathing and Patient connected to face mask oxygen  Post-op Assessment: Report given to RN  Post vital signs: Reviewed and stable  Last Vitals:  Vitals Value Taken Time  BP 127/69 07/01/24 14:24  Temp    Pulse 68 07/01/24 14:26  Resp 24 07/01/24 14:26  SpO2 96 % 07/01/24 14:26  Vitals shown include unfiled device data.  Last Pain:  Vitals:   07/01/24 1155  TempSrc: Temporal  PainSc: 0-No pain         Complications: No notable events documented.

## 2024-07-01 NOTE — Anesthesia Postprocedure Evaluation (Signed)
 Anesthesia Post Note  Patient: Spencer Tanner  Procedure(s) Performed: ARTHROSCOPY, SHOULDER WITH REPAIR, ROTATOR CUFF, OPEN (Right: Shoulder) DECOMPRESSION, SUBACROMIAL SPACE (Right: Shoulder) TENODESIS, BICEPS (Right: Shoulder)  Patient location during evaluation: PACU Anesthesia Type: General Level of consciousness: awake and alert Pain management: pain level controlled Vital Signs Assessment: post-procedure vital signs reviewed and stable Respiratory status: spontaneous breathing, nonlabored ventilation, respiratory function stable and patient connected to nasal cannula oxygen Cardiovascular status: blood pressure returned to baseline and stable Postop Assessment: no apparent nausea or vomiting Anesthetic complications: no Comments: Breathing well, eupnea, no dyspnea   No notable events documented.   Last Vitals:  Vitals:   07/01/24 1430 07/01/24 1445  BP: 136/69 (!) 156/84  Pulse: 68 70  Resp: (!) 23 20  Temp:  (!) 36.3 C  SpO2: 99% 96%    Last Pain:  Vitals:   07/01/24 1445  TempSrc:   PainSc: 0-No pain                 Tyrail Grandfield C Novalee Horsfall

## 2024-07-01 NOTE — Anesthesia Procedure Notes (Signed)
 Procedure Name: Intubation Date/Time: 07/01/2024 12:42 PM  Performed by: Veronica Alm BROCKS, CRNAPre-anesthesia Checklist: Patient identified, Patient being monitored, Timeout performed, Emergency Drugs available and Suction available Patient Re-evaluated:Patient Re-evaluated prior to induction Oxygen Delivery Method: Circle system utilized Preoxygenation: Pre-oxygenation with 100% oxygen Induction Type: IV induction Ventilation: Mask ventilation without difficulty Laryngoscope Size: McGrath and 4 Grade View: Grade I Tube type: Oral Tube size: 7.5 mm Number of attempts: 1 Airway Equipment and Method: Stylet Placement Confirmation: ETT inserted through vocal cords under direct vision, positive ETCO2 and breath sounds checked- equal and bilateral Secured at: 21 cm Tube secured with: Tape Dental Injury: Teeth and Oropharynx as per pre-operative assessment

## 2024-07-01 NOTE — Op Note (Signed)
 SURGERY DATE: 07/01/2024   PRE-OP DIAGNOSIS:  1. Right subacromial impingement 2. Right biceps tendinopathy 3. Right rotator cuff tear  POST-OP DIAGNOSIS: 1. Right subacromial impingement 2. Right biceps tendinopathy 3. Right rotator cuff tear  PROCEDURES:  1. Right arthroscopic rotator cuff repair 2. Right arthroscopic biceps tenodesis 3. Right arthroscopic subacromial decompression 4. Right arthroscopic extensive debridement of shoulder (glenohumeral and subacromial spaces)   SURGEON: Earnestine HILARIO Blanch, MD   ASSISTANT: DOROTHA Krystal Doyne, PA   ANESTHESIA: Gen with Exparel interscalene block   ESTIMATED BLOOD LOSS: 5cc   DRAINS:  none   TOTAL IV FLUIDS: per anesthesia      SPECIMENS: none   IMPLANTS:  - Arthrex 2.56mm PushLock x 1 - Arthrex 4.17mm SwiveLock x 1 - Iconix SPEED double loaded with 1.2 and 2.0mm tape x 1     OPERATIVE FINDINGS:  Examination under anesthesia: A careful examination under anesthesia was performed.  Passive range of motion was: FF: 150; ER at side: 60; ER in abduction: 100; IR in abduction: 45.  Anterior load shift: NT.  Posterior load shift: NT.  Sulcus in neutral: NT.  Sulcus in ER: NT.     Intra-operative findings: A thorough arthroscopic examination of the shoulder was performed.  The findings are: 1. Biceps tendon: tendinopathy with significant erythema  2. Superior labrum: erythema 3. Posterior labrum and capsule: normal 4. Inferior capsule and inferior recess: normal 5. Glenoid cartilage surface: Normal 6. Supraspinatus attachment: full-thickness V-shaped tear of the anterior supraspinatus 7. Posterior rotator cuff attachment: normal 8. Humeral head articular cartilage: normal 9. Rotator interval: significant synovitis 10: Subscapularis tendon: attachment intact 11. Anterior labrum: Mildly degenerative 12. IGHL: normal   OPERATIVE REPORT:    Indications for procedure:  Spencer Tanner is a 48 y.o. male with approximately 1 month  of right shoulder pain after a fall while at work.  He has had difficulty with overhead motion since that time with sensations of weakness. Clinical exam and MRI were suggestive of rotator cuff tear, biceps tendinopathy, and subacromial impingement. After discussion of risks, benefits, and alternatives to surgery, the patient elected to proceed.    Procedure in detail:   I identified Spencer Tanner in the pre-operative holding area.  I marked the operative shoulder with my initials. I reviewed the risks and benefits of the proposed surgical intervention, and the patient wished to proceed.  Anesthesia was then performed with an Exparel interscalene block.  The patient was transferred to the operative suite and placed in the beach chair position.     Appropriate IV antibiotics were administered prior to incision. The operative upper extremity was then prepped and draped in standard fashion. A time out was performed confirming the correct extremity, correct patient, and correct procedure.    I then created a standard posterior portal with an 11 blade. The glenohumeral joint was easily entered with a blunt trocar and the arthroscope introduced. The findings of diagnostic arthroscopy are described above. I debrided degenerative tissue including the synovitic tissue about the rotator interval and anterior and superior labrum. I then coagulated the inflamed synovium to obtain hemostasis and reduce the risk of post-operative swelling using an Arthrocare radiofrequency device.   I then turned my attention to the arthroscopic biceps tenodesis. The Loop n Tack technique was used to pass a FiberTape through the biceps in a locked fashion adjacent to the biceps anchor.  A hole for a 2.9 mm Arthrex PushLock was drilled in the bicipital groove just superior to the  subscapularis tendon insertion.  The biceps tendon was then cut and the biceps anchor complex was debrided down to a stable base on the superior  labrum.  The FiberTape was loaded onto the PushLock anchor and impacted into place into the previously drilled hole in the bicipital groove.  This appropriately secured the biceps into the bicipital groove and took it off of tension.   Next, the arthroscope was then introduced into the subacromial space. A direct lateral portal was created with an 11-blade after spinal needle localization. An extensive subacromial bursectomy and debridement was performed using a combination of the shaver and Arthrocare wand. The entire acromial undersurface was exposed and the CA ligament was subperiosteally elevated to expose the anterior acromial hook. A burr was used to create a flat anterior and lateral aspect of the acromion, converting it from a Type 3 to a Type 1 acromion. Care was made to keep the deltoid fascia intact.   Next, I created an accessory anterolateral portal to assist with instrumentation while viewing from the direct lateral portal.  I debrided the poor quality edges of the supraspinatus tendon.  This was a V-shaped tear of the anterior supraspinatus.    I then percutaneously placed 1 Iconix SPEED medial row anchor at the articular margin. I then shuttled all 4 strands of tape through the rotator cuff just lateral to the musculotendinous junction using a FirstPass suture passer spanning the midportion and anterior extent of the tear as appropriate reduction was a more anterior to posterior pull.  A side-to-side margin convergence stitch was then placed and an arthroscopic knot was tied.  This almost completely reduced the hole in the rotator cuff.  Next, and all 4 strands of suture were passed through an [employer] anchor.  This was placed approximately 2 cm distal to the lateral edge of the footprint posterior to the tear with appropriate tensioning of each suture prior to final fixation. There was 1 small dogear centrally.  The knotless mechanism of the SwiveLock anchor was utilized to reduce the  dogear. This construct allowed for excellent reapproximation of the rotator cuff to its native footprint without undue tension.  Appropriate compression was achieved. The repair was stable to external and internal rotation.   Fluid was evacuated from the shoulder, and the portals were closed with 3-0 Nylon. Xeroform was applied to the portals. A sterile dressing was applied, followed by a Polar Care sleeve and a SlingShot shoulder immobilizer/sling. The patient was awakened from anesthesia without difficulty and was transferred to the PACU in stable condition.    Of note, assistance from a PA was essential to performing the surgery.  PA was present for the entire surgery.  PA assisted with patient positioning, retraction, instrumentation, and wound closure. The surgery would have been more difficult and had longer operative time without PA assistance.   COMPLICATIONS: none   DISPOSITION: plan for discharge home after recovery in PACU     POSTOPERATIVE PLAN: Remain in sling (except hygiene and elbow/wrist/hand RoM exercises as instructed by PT) x 4 weeks and NWB for this time. PT to begin 3-4 days after surgery.  Small/medium rotator cuff repair rehab protocol. ASA 325mg  daily x 2 weeks for DVT ppx.

## 2024-07-01 NOTE — Discharge Instructions (Signed)
 Post-Op Instructions - Rotator Cuff Repair  1. Bracing: You will wear a shoulder immobilizer or sling for 4 weeks.   2. Driving: No driving for 4 weeks post-op.   3. Activity: No active lifting for 2 months. Wrist, hand, and elbow motion only. Avoid lifting the upper arm away from the body except for hygiene. You are permitted to bend and straighten the elbow passively only (no active elbow motion). You may use your hand and wrist for typing, writing, and managing utensils (cutting food). Do not lift more than a coffee cup for 8 weeks.  When sleeping or resting, inclined positions (recliner chair or wedge pillow) and a pillow under the forearm for support may provide better comfort for up to 4 weeks.  Avoid long distance travel for 4 weeks.  Return to normal activities after rotator cuff repair repair normally takes 6 months on average. If rehab goes very well, may be able to do most activities at 4 months, except overhead or contact sports.  4. Physical Therapy: Begins 3-4 days after surgery, and proceed 1 time per week for the first 6 weeks, then 1-2 times per week from weeks 6-20 post-op.  5. Medications:  - You will be provided a prescription for narcotic pain medicine. After surgery, take 1-2 narcotic tablets every 4 hours if needed for severe pain.  - A prescription for anti-nausea medication will be provided in case the narcotic medicine causes nausea - take 1 tablet every 6 hours only if nauseated.   - Take tylenol  1000 mg (2 Extra Strength tablets or 3 regular strength) every 8 hours for pain.  May decrease or stop tylenol  5 days after surgery if you are having minimal pain. - Take ASA 325mg /day x 2 weeks to help prevent DVTs/PEs (blood clots).  - DO NOT take ANY nonsteroidal anti-inflammatory pain medications (Advil , Motrin , Ibuprofen , Aleve , Naproxen , or Naprosyn ). These medicines can inhibit healing of your shoulder repair.    If you are taking prescription medication for anxiety,  depression, insomnia, muscle spasm, chronic pain, or for attention deficit disorder, you are advised that you are at a higher risk of adverse effects with use of narcotics post-op, including narcotic addiction/dependence, depressed breathing, death. If you use non-prescribed substances: alcohol, marijuana, cocaine, heroin, methamphetamines, etc., you are at a higher risk of adverse effects with use of narcotics post-op, including narcotic addiction/dependence, depressed breathing, death. You are advised that taking > 50 morphine  milligram equivalents (MME) of narcotic pain medication per day results in twice the risk of overdose or death. For your prescription provided: oxycodone  5 mg - taking more than 6 tablets per day would result in > 50 morphine  milligram equivalents (MME) of narcotic pain medication. Be advised that we will prescribe narcotics short-term, for acute post-operative pain only - 3 weeks for major operations such as shoulder repair/reconstruction surgeries.     6. Post-Op Appointment:  Your first post-op appointment will be 10-14 days post-op.  7. Work or School: For most, but not all procedures, we advise staying out of work or school for at least 1 to 2 weeks in order to recover from the stress of surgery and to allow time for healing.   If you need a work or school note this can be provided.   8. Smoking: If you are a smoker, you need to refrain from smoking in the postoperative period. The nicotine in cigarettes will inhibit healing of your shoulder repair and decrease the chance of successful repair. Similarly, nicotine containing  products (gum, patches) should be avoided.   Post-operative Brace: Apply and remove the brace you received as you were instructed to at the time of fitting and as described in detail as the brace's instructions for use indicate.  Wear the brace for the period of time prescribed by your physician.  The brace can be cleaned with soap and water and  allowed to air dry only.  Should the brace result in increased pain, decreased feeling (numbness/tingling), increased swelling or an overall worsening of your medical condition, please contact your doctor immediately.  If an emergency situation occurs as a result of wearing the brace after normal business hours, please dial 911 and seek immediate medical attention.  Let your doctor know if you have any further questions about the brace issued to you. Refer to the shoulder sling instructions for use if you have any questions regarding the correct fit of your shoulder sling.  [facility] Customer Care for Troubleshooting: [phone]  Video that illustrates how to properly use a shoulder sling: Instructions for Proper Use of an Orthopaedic Sling http://bass.com/

## 2024-07-04 ENCOUNTER — Other Ambulatory Visit: Payer: Self-pay

## 2024-07-04 MED ORDER — BUDESONIDE-FORMOTEROL FUMARATE 160-4.5 MCG/ACT IN AERO
2.0000 | INHALATION_SPRAY | Freq: Two times a day (BID) | RESPIRATORY_TRACT | 0 refills | Status: DC
  Filled 2024-07-04 – 2024-07-07 (×2): qty 10.2, 30d supply, fill #0

## 2024-07-06 ENCOUNTER — Other Ambulatory Visit: Payer: Self-pay

## 2024-07-06 DIAGNOSIS — M25511 Pain in right shoulder: Secondary | ICD-10-CM | POA: Diagnosis not present

## 2024-07-06 DIAGNOSIS — Z9889 Other specified postprocedural states: Secondary | ICD-10-CM | POA: Diagnosis not present

## 2024-07-07 ENCOUNTER — Other Ambulatory Visit: Payer: Self-pay

## 2024-07-13 ENCOUNTER — Other Ambulatory Visit: Payer: Self-pay

## 2024-07-13 DIAGNOSIS — Z9889 Other specified postprocedural states: Secondary | ICD-10-CM | POA: Diagnosis not present

## 2024-07-13 DIAGNOSIS — M25511 Pain in right shoulder: Secondary | ICD-10-CM | POA: Diagnosis not present

## 2024-07-20 ENCOUNTER — Other Ambulatory Visit: Payer: Self-pay

## 2024-07-20 DIAGNOSIS — M25511 Pain in right shoulder: Secondary | ICD-10-CM | POA: Diagnosis not present

## 2024-07-20 DIAGNOSIS — Z9889 Other specified postprocedural states: Secondary | ICD-10-CM | POA: Diagnosis not present

## 2024-07-20 MED ORDER — OXYCODONE HCL 5 MG PO TABS
5.0000 mg | ORAL_TABLET | ORAL | 0 refills | Status: DC | PRN
  Filled 2024-07-20: qty 30, 3d supply, fill #0

## 2024-07-27 DIAGNOSIS — Z9889 Other specified postprocedural states: Secondary | ICD-10-CM | POA: Diagnosis not present

## 2024-08-02 ENCOUNTER — Other Ambulatory Visit (HOSPITAL_COMMUNITY): Payer: Self-pay

## 2024-08-02 ENCOUNTER — Other Ambulatory Visit: Payer: Self-pay

## 2024-08-02 MED ORDER — BUDESONIDE-FORMOTEROL FUMARATE 160-4.5 MCG/ACT IN AERO
2.0000 | INHALATION_SPRAY | Freq: Two times a day (BID) | RESPIRATORY_TRACT | 0 refills | Status: DC
  Filled 2024-08-02 – 2024-08-03 (×2): qty 10.2, 30d supply, fill #0

## 2024-08-03 ENCOUNTER — Other Ambulatory Visit: Payer: Self-pay

## 2024-08-03 ENCOUNTER — Other Ambulatory Visit (HOSPITAL_COMMUNITY): Payer: Self-pay

## 2024-08-03 DIAGNOSIS — Z9889 Other specified postprocedural states: Secondary | ICD-10-CM | POA: Diagnosis not present

## 2024-08-03 DIAGNOSIS — M25511 Pain in right shoulder: Secondary | ICD-10-CM | POA: Diagnosis not present

## 2024-08-04 ENCOUNTER — Other Ambulatory Visit: Payer: Self-pay

## 2024-08-05 ENCOUNTER — Other Ambulatory Visit (HOSPITAL_COMMUNITY): Payer: Self-pay

## 2024-08-07 ENCOUNTER — Other Ambulatory Visit: Payer: Self-pay

## 2024-08-08 ENCOUNTER — Other Ambulatory Visit: Payer: Self-pay

## 2024-08-08 MED ORDER — DILTIAZEM HCL ER COATED BEADS 180 MG PO CP24
180.0000 mg | ORAL_CAPSULE | Freq: Every day | ORAL | 0 refills | Status: DC
  Filled 2024-08-08: qty 90, 90d supply, fill #0

## 2024-08-10 DIAGNOSIS — M25511 Pain in right shoulder: Secondary | ICD-10-CM | POA: Diagnosis not present

## 2024-08-10 DIAGNOSIS — Z9889 Other specified postprocedural states: Secondary | ICD-10-CM | POA: Diagnosis not present

## 2024-08-16 ENCOUNTER — Other Ambulatory Visit: Payer: Self-pay

## 2024-08-17 ENCOUNTER — Other Ambulatory Visit: Payer: Self-pay

## 2024-08-17 MED ORDER — OXYCODONE HCL 5 MG PO TABS
5.0000 mg | ORAL_TABLET | Freq: Three times a day (TID) | ORAL | 0 refills | Status: DC | PRN
  Filled 2024-08-17: qty 20, 7d supply, fill #0

## 2024-08-23 ENCOUNTER — Ambulatory Visit: Admitting: Podiatry

## 2024-08-23 DIAGNOSIS — Z79899 Other long term (current) drug therapy: Secondary | ICD-10-CM | POA: Diagnosis not present

## 2024-08-23 DIAGNOSIS — B351 Tinea unguium: Secondary | ICD-10-CM

## 2024-08-23 NOTE — Progress Notes (Unsigned)
 Toenaislx 10 lft lamisl

## 2024-08-25 ENCOUNTER — Other Ambulatory Visit: Payer: Self-pay | Admitting: Podiatry

## 2024-08-25 ENCOUNTER — Other Ambulatory Visit: Payer: Self-pay

## 2024-08-25 LAB — HEPATIC FUNCTION PANEL
ALT: 29 IU/L (ref 0–44)
AST: 19 IU/L (ref 0–40)
Albumin: 4.2 g/dL (ref 4.1–5.1)
Alkaline Phosphatase: 109 IU/L (ref 47–123)
Bilirubin Total: 0.4 mg/dL (ref 0.0–1.2)
Bilirubin, Direct: 0.12 mg/dL (ref 0.00–0.40)
Total Protein: 7 g/dL (ref 6.0–8.5)

## 2024-08-25 MED ORDER — TERBINAFINE HCL 250 MG PO TABS
250.0000 mg | ORAL_TABLET | Freq: Every day | ORAL | 0 refills | Status: AC
  Filled 2024-08-25: qty 90, 90d supply, fill #0

## 2024-08-26 ENCOUNTER — Other Ambulatory Visit: Payer: Self-pay

## 2024-08-29 ENCOUNTER — Other Ambulatory Visit: Payer: Self-pay

## 2024-09-03 ENCOUNTER — Other Ambulatory Visit: Payer: Self-pay

## 2024-09-08 ENCOUNTER — Emergency Department
Admission: EM | Admit: 2024-09-08 | Discharge: 2024-09-08 | Disposition: A | Discharge status: Home / Self Care | Attending: Emergency Medicine | Admitting: Emergency Medicine

## 2024-09-08 ENCOUNTER — Encounter: Payer: Self-pay | Admitting: *Deleted

## 2024-09-08 ENCOUNTER — Other Ambulatory Visit: Payer: Self-pay

## 2024-09-08 ENCOUNTER — Emergency Department

## 2024-09-08 DIAGNOSIS — J45909 Unspecified asthma, uncomplicated: Secondary | ICD-10-CM | POA: Diagnosis not present

## 2024-09-08 DIAGNOSIS — R002 Palpitations: Secondary | ICD-10-CM | POA: Insufficient documentation

## 2024-09-08 DIAGNOSIS — I10 Essential (primary) hypertension: Secondary | ICD-10-CM | POA: Insufficient documentation

## 2024-09-08 LAB — BASIC METABOLIC PANEL WITH GFR
Anion gap: 11 (ref 5–15)
BUN: 12 mg/dL (ref 6–20)
CO2: 26 mmol/L (ref 22–32)
Calcium: 9.1 mg/dL (ref 8.9–10.3)
Chloride: 103 mmol/L (ref 98–111)
Creatinine, Ser: 1.32 mg/dL — ABNORMAL HIGH (ref 0.61–1.24)
GFR, Estimated: >60 mL/min
Glucose, Bld: 132 mg/dL — ABNORMAL HIGH (ref 70–99)
Potassium: 3.8 mmol/L (ref 3.5–5.1)
Sodium: 140 mmol/L (ref 135–145)

## 2024-09-08 LAB — CBC
HCT: 43.8 % (ref 39.0–52.0)
Hemoglobin: 14.7 g/dL (ref 13.0–17.0)
MCH: 29.2 pg (ref 26.0–34.0)
MCHC: 33.6 g/dL (ref 30.0–36.0)
MCV: 87.1 fL (ref 80.0–100.0)
Platelets: 319 K/uL (ref 150–400)
RBC: 5.03 MIL/uL (ref 4.22–5.81)
RDW: 13.7 % (ref 11.5–15.5)
WBC: 8.2 K/uL (ref 4.0–10.5)
nRBC: 0 % (ref 0.0–0.2)

## 2024-09-08 LAB — TROPONIN T, HIGH SENSITIVITY: Troponin T High Sensitivity: 11 ng/L (ref 0–19)

## 2024-09-08 NOTE — ED Triage Notes (Signed)
 Pt ambulatory to triage.  Pt reports heart beating irregular earlier today.  Hx afib.  No chest pain or sob  no n/v/  pt alert speech clear.

## 2024-09-08 NOTE — ED Provider Notes (Signed)
 "  [facility] Provider Note    Event Date/Time   First MD Initiated Contact with Patient 09/08/24 2133     (approximate)   History   Chief Complaint Palpitations   HPI  Spencer Tanner is a 49 y.o. male with past medical history of hypertension, asthma, and atrial fibrillation who presents to the ED complaining of palpitations.  Patient reports that over the past 2 hours he has been having intermittent sensation of his heart beating irregularly.  He is concerned that he he is having runs of atrial fibrillation, but denies any associated chest pain or shortness of breath.  He does not currently feel like his heart is beating irregularly and denies any sensation of his heart racing.  He has not had any lightheadedness or near syncope.     Physical Exam   Triage Vital Signs: ED Triage Vitals [09/08/24 2133]  Encounter Vitals Group     BP (!) 143/98     Girls Systolic BP Percentile      Girls Diastolic BP Percentile      Boys Systolic BP Percentile      Boys Diastolic BP Percentile      Pulse Rate 70     Resp 20     Temp 98.9 F (37.2 C)     Temp Source Oral     SpO2 98 %     Weight 215 lb (97.5 kg)     Height 6' (1.829 m)     Head Circumference      Peak Flow      Pain Score 0     Pain Loc      Pain Education      Exclude from Growth Chart     Most recent vital signs: Vitals:   09/08/24 2133  BP: (!) 143/98  Pulse: 70  Resp: 20  Temp: 98.9 F (37.2 C)  SpO2: 98%    Constitutional: Alert and oriented. Eyes: Conjunctivae are normal. Head: Atraumatic. Nose: No congestion/rhinnorhea. Mouth/Throat: Mucous membranes are moist.  Cardiovascular: Normal rate, regular rhythm. Grossly normal heart sounds.  2+ radial pulses bilaterally. Respiratory: Normal respiratory effort.  No retractions. Lungs CTAB. Gastrointestinal: Soft and nontender. No distention. Musculoskeletal: No lower extremity tenderness nor edema.  Neurologic:  Normal  speech and language. No gross focal neurologic deficits are appreciated.    ED Results / Procedures / Treatments   Labs (all labs ordered are listed, but only abnormal results are displayed) Labs Reviewed  BASIC METABOLIC PANEL WITH GFR - Abnormal; Notable for the following components:      Result Value   Glucose, Bld 132 (*)    Creatinine, Ser 1.32 (*)    All other components within normal limits  CBC  TROPONIN T, HIGH SENSITIVITY  TROPONIN T, HIGH SENSITIVITY     EKG  ED ECG REPORT I, Carlin Palin, the attending physician, personally viewed and interpreted this ECG.   Date: 09/08/2024  EKG Time: 21:30  Rate: 70  Rhythm: normal sinus rhythm  Axis: Normal  Intervals:none  ST&T Change: None  RADIOLOGY Chest x-ray reviewed and interpreted by me with no infiltrate, edema, or effusion.  PROCEDURES:  Critical Care performed: No  Procedures   MEDICATIONS ORDERED IN ED: Medications - No data to display   IMPRESSION / MDM / ASSESSMENT AND PLAN / ED COURSE  I reviewed the triage vital signs and the nursing notes.  49 y.o. male with past medical history of hypertension, asthma, and atrial fibrillation who presents to the ED complaining of sensation of irregular heartbeat intermittently over the past 2 hours.  Patient's presentation is most consistent with acute presentation with potential threat to life or bodily function.  Differential diagnosis includes, but is not limited to, arrhythmia, anemia, electrolyte abnormality, AKI, anxiety.  Patient well-appearing and in no acute distress, vital signs are unremarkable.  EKG shows no evidence of arrhythmia or ischemia and troponin within normal limits, doubt ACS.  Additional labs without significant anemia, leukocytosis, electrolyte abnormality, or AKI.  Chest x-ray is also negative for acute finding.  Patient was observed on cardiac monitor with no evidence of arrhythmia, is appropriate for  discharge home with cardiology follow-up.  He was counseled to return to the ED for new or worsening symptoms, patient agrees with plan.      FINAL CLINICAL IMPRESSION(S) / ED DIAGNOSES   Final diagnoses:  Palpitations     Rx / DC Orders   ED Discharge Orders          Ordered    Ambulatory referral to Cardiology        09/08/24 2254             Note:  This document was prepared using Dragon voice recognition software and may include unintentional dictation errors.   Willo Dunnings, MD 09/08/24 2318  "

## 2024-09-13 ENCOUNTER — Other Ambulatory Visit: Payer: Self-pay

## 2024-09-13 MED ORDER — BUDESONIDE-FORMOTEROL FUMARATE 160-4.5 MCG/ACT IN AERO
2.0000 | INHALATION_SPRAY | Freq: Two times a day (BID) | RESPIRATORY_TRACT | 0 refills | Status: DC
  Filled 2024-09-13: qty 10.2, 30d supply, fill #0

## 2024-09-14 ENCOUNTER — Other Ambulatory Visit: Payer: Self-pay

## 2024-09-14 MED ORDER — OXYCODONE HCL 5 MG PO TABS
5.0000 mg | ORAL_TABLET | Freq: Three times a day (TID) | ORAL | 0 refills | Status: DC | PRN
  Filled 2024-09-14: qty 20, 7d supply, fill #0

## 2024-09-14 NOTE — Progress Notes (Signed)
 [facility]                    PT Daily Progress Note  Patients Name:Keston Arkwright                        MRN #[id number]      Date:09/14/2024                        Visit Number: 13                                                                      Progress Related to Long and Short Term Goals:   []  ROM:   []  Strength Pain -    while- [] at rest    [] During activity:  []  Functional goal: []  Other: []  Balance:  Patient / Family Education:    []  Progressed HEP to include:  []  Reviewed previous HEP: ______________________________________________________________________  Skilled Service Provided:  To: [x]  right   []  left     []  bilateral    []   core/body stability and balance  []  neck   [x]   shoulder   []  upper arm/  []  elbow   []  lower arm  []  wrist   []  hand   []   fingers  []  back     []  hip    []  upper leg    []  knee  []  lower leg   []   ankle  foot       Ther Ex Therapeutic Procedure CPT 97110 : 35 minutes   Manual Tx Manual Therapy CPT 97140: 15 minutes    Ice / Heat             E-Stim                 Infrared     Vaso-pneumatic compression      [x]  Inc ROM [x] Inc joint mob [x] Dec pain [] Dec inflam Setting:  [] Dec swelling  [x]  Inc Flex [x]  Inc Flex [x] Dec inflam [] Dec pain [] Inc Healing [] Dec edema  []  Inc  Bal [x]  Inc ROM [x] Dec swelling [] Dec swelling [] Dec pain Level      []  1        []  2      []  3  [x]  Inc Strength [] Astym [] Inc circulation [] Inc circulation [] Inc circulation Temp:         F  Progressive  HEP          []   [] tension/ spasms/soft tissue mob [] Inc soft tissue mobility [] Inc tissue mob    [] Inc Posture [] Correct malaign  Settings:      Subjective:  Patient reports his shoulder feels okay, still some soreness.  Treatment notes:   Performed manual therapy as tolerated. Therex per flow chart.  Assessment of Treatment:   Strength below shoulder level continues to improve, elevation remains weak and challenging.    Patients Response to Treatment:  [x]  pain  []  pain    []   flexibility      []  endurance      []    strength     [x]   AROM      [x]   PROM       []   Spasms    []  Posture / alignment    []   Joint Mobility  []  Other:   Functional Improvement Noted:  Increased ability to  []  walk   []  stand for longer periods   []  dress with less help    []  lift     []   reach     []   bend    []  Other:  Remaining Impairment Requiring Continued Treatment:   [x]   flexibility   [x]   ROM   [x]   pain  [x]  strength   [x]   weakness    []   balance   []   swelling   []   inflammation        []  Spasm  []  posture/alignment    []   Other:  Plan: [x]   Continue Plan of Care        []   Add:           []   Discharge after next visit    []   Discharge to HEP   []  Other:   PT Billing Documentation Date of Onset: 07/01/24 Visit Number: 13       Frequently Used Timed Codes Therapeutic Procedure CPT 97110 : 35 minutes Manual Therapy CPT 97140: 15 minutes                    Total Treatment Time: 60 minutes       Eva LELON Hatchet, PT   UE Treatment Log    Date  1/16 1/21 1/28       UBE          wall walk x10' X10 Table slide x10 x10       Pulley 2x5' 2x5' 2x5'       Flexion          Scaption          Abduction          T-Band IR RTB 3x10 RTB 3x10 RTB 3x10       T-Band ER  0/ 45/  90  RTB 3x10 RTB 3x10 RTB 3x10       Scap Retraction GTB 3x10 GTB 3x10 GTB 3x10       Shoulder Extension GTB 3x10 GTB 3x10 GTB 3x10       Lat Pulls          F. M.  Rows          Tricep Ext          Bicep Curls          Shoulder Shrugs          wall pushups          S/L ER          Horiz Abd @ 90 prone/stand   3x10       Horiz Abd @ 120 prone/stand          Prone Extension 2# 3x10 2# 3x10 2# 3x10       Prone Rows 2# 3x10 2# 3x10 2# 3x10       Serratus Punch          PNFD2 Flex/Ext          Pendulum          Wrist/hand          AAROM w/ cane press/er Press, flex x20 Flex, sup and sit  PROM- all planes/ all directions/ grade  x15' x15' x15'       Ultrasound/ Infrared          HP/CP  with/without IFC CP x 10' CP x10'

## 2024-09-15 ENCOUNTER — Other Ambulatory Visit: Payer: Self-pay

## 2024-09-15 ENCOUNTER — Ambulatory Visit: Admitting: Cardiology

## 2024-09-15 ENCOUNTER — Encounter: Payer: Self-pay | Admitting: Cardiology

## 2024-09-15 VITALS — BP 130/76 | HR 65 | Ht 72.0 in | Wt 225.6 lb

## 2024-09-15 DIAGNOSIS — I480 Paroxysmal atrial fibrillation: Secondary | ICD-10-CM | POA: Diagnosis not present

## 2024-09-15 DIAGNOSIS — K219 Gastro-esophageal reflux disease without esophagitis: Secondary | ICD-10-CM | POA: Diagnosis not present

## 2024-09-15 DIAGNOSIS — I10 Essential (primary) hypertension: Secondary | ICD-10-CM

## 2024-09-15 MED ORDER — DILTIAZEM HCL ER COATED BEADS 240 MG PO CP24
240.0000 mg | ORAL_CAPSULE | Freq: Every day | ORAL | 3 refills | Status: AC
  Filled 2024-09-15: qty 60, 60d supply, fill #0
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
## 2024-09-15 NOTE — Patient Instructions (Signed)
 Medication Instructions:  - INCREASE cardizem  to 240 mg daily   *If you need a refill on your cardiac medications before your next appointment, please call your pharmacy*  Lab Work: No labs ordered today  If you have labs (blood work) drawn today and your tests are completely normal, you will receive your results only by: MyChart Message (if you have MyChart) OR A paper copy in the mail If you have any lab test that is abnormal or we need to change your treatment, we will call you to review the results.  Testing/Procedures: No test ordered today   Follow-Up: At [facility], you and your health needs are our priority.  As part of our continuing mission to provide you with exceptional heart care, our providers are all part of one team.  This team includes your primary Cardiologist (physician) and Advanced Practice Providers or APPs (Physician Assistants and Nurse Practitioners) who all work together to provide you with the care you need, when you need it.  Your next appointment:   4 month(s)  Provider:   Redell Cave, MD    We recommend signing up for the patient portal called MyChart.  Sign up information is provided on this After Visit Summary.  MyChart is used to connect with patients for Virtual Visits (Telemedicine).  Patients are able to view lab/test results, encounter notes, upcoming appointments, etc.  Non-urgent messages can be sent to your provider as well.   To learn more about what you can do with MyChart, go to [email or web address].

## 2024-09-20 ENCOUNTER — Other Ambulatory Visit: Payer: Self-pay

## 2024-09-20 MED ORDER — PREVNAR 20 0.5 ML IM SUSY
0.5000 mL | PREFILLED_SYRINGE | Freq: Once | INTRAMUSCULAR | 0 refills | Status: AC
  Filled 2024-09-20: qty 0.5, 1d supply, fill #0

## 2024-09-20 MED ORDER — BUDESONIDE-FORMOTEROL FUMARATE 160-4.5 MCG/ACT IN AERO
INHALATION_SPRAY | RESPIRATORY_TRACT | 5 refills | Status: AC
  Filled 2024-09-20: qty 10.2, 30d supply, fill #0

## 2024-09-20 MED ORDER — ALBUTEROL SULFATE HFA 108 (90 BASE) MCG/ACT IN AERS: INHALATION_SPRAY | RESPIRATORY_TRACT | 5 refills | Status: AC

## 2024-09-20 MED ORDER — CLINDAMYCIN HCL 300 MG PO CAPS
ORAL_CAPSULE | ORAL | 0 refills | Status: AC
  Filled 2024-09-20: qty 30, 10d supply, fill #0

## 2024-12-22 ENCOUNTER — Ambulatory Visit: Admitting: Podiatry

## 2025-01-16 ENCOUNTER — Ambulatory Visit: Admitting: Cardiology
